# Patient Record
Sex: Female | Born: 1935 | Race: White | Hispanic: No | Smoking: Never smoker
Health system: Southern US, Community
[De-identification: ages and names within clinical notes are randomized; demographics above are authoritative.]

## PROBLEM LIST (undated history)

## (undated) DIAGNOSIS — N390 Urinary tract infection, site not specified: Secondary | ICD-10-CM

## (undated) DIAGNOSIS — G309 Alzheimer's disease, unspecified: Secondary | ICD-10-CM

## (undated) DIAGNOSIS — I10 Essential (primary) hypertension: Secondary | ICD-10-CM

## (undated) DIAGNOSIS — F0280 Dementia in other diseases classified elsewhere without behavioral disturbance: Secondary | ICD-10-CM

## (undated) DIAGNOSIS — M009 Pyogenic arthritis, unspecified: Secondary | ICD-10-CM

## (undated) DIAGNOSIS — I4891 Unspecified atrial fibrillation: Secondary | ICD-10-CM

## (undated) HISTORY — DX: Essential (primary) hypertension: I10

## (undated) HISTORY — DX: Unspecified atrial fibrillation: I48.91

---
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
## 1962-02-08 HISTORY — PX: ABDOMINAL HYSTERECTOMY: SHX81

## 1975-02-09 HISTORY — PX: BLADDER SURGERY: SHX569

## 2001-02-08 HISTORY — PX: CATARACT EXTRACTION: SUR2

## 2002-07-26 ENCOUNTER — Encounter: Admission: RE | Admit: 2002-07-26 | Discharge: 2002-07-26 | Payer: Self-pay | Admitting: Surgery

## 2002-07-26 ENCOUNTER — Encounter: Payer: Self-pay | Admitting: Surgery

## 2004-01-15 ENCOUNTER — Emergency Department (HOSPITAL_COMMUNITY): Admission: EM | Admit: 2004-01-15 | Discharge: 2004-01-15 | Payer: Self-pay | Admitting: Emergency Medicine

## 2004-01-16 ENCOUNTER — Encounter: Admission: RE | Admit: 2004-01-16 | Discharge: 2004-01-16 | Payer: Self-pay | Admitting: Gynecology

## 2004-02-13 ENCOUNTER — Emergency Department (HOSPITAL_COMMUNITY): Admission: EM | Admit: 2004-02-13 | Discharge: 2004-02-13 | Payer: Self-pay | Admitting: Emergency Medicine

## 2005-07-01 ENCOUNTER — Other Ambulatory Visit: Admission: RE | Admit: 2005-07-01 | Discharge: 2005-07-01 | Payer: Self-pay | Admitting: Gynecology

## 2005-07-15 ENCOUNTER — Encounter: Admission: RE | Admit: 2005-07-15 | Discharge: 2005-07-15 | Payer: Self-pay | Admitting: Gynecology

## 2005-10-18 ENCOUNTER — Ambulatory Visit (HOSPITAL_COMMUNITY): Admission: RE | Admit: 2005-10-18 | Discharge: 2005-10-18 | Payer: Self-pay | Admitting: Gynecology

## 2006-07-20 ENCOUNTER — Ambulatory Visit: Payer: Self-pay | Admitting: Cardiology

## 2006-08-11 ENCOUNTER — Encounter: Payer: Self-pay | Admitting: Cardiology

## 2006-08-11 ENCOUNTER — Ambulatory Visit: Payer: Self-pay

## 2006-09-07 ENCOUNTER — Ambulatory Visit: Payer: Self-pay | Admitting: Cardiology

## 2006-10-13 ENCOUNTER — Ambulatory Visit: Payer: Self-pay | Admitting: Cardiology

## 2007-11-09 ENCOUNTER — Ambulatory Visit: Payer: Self-pay | Admitting: Cardiology

## 2007-11-09 LAB — CONVERTED CEMR LAB
BUN: 22 mg/dL (ref 6–23)
CO2: 28 meq/L (ref 19–32)
Calcium: 9.1 mg/dL (ref 8.4–10.5)
Chloride: 99 meq/L (ref 96–112)
Creatinine, Ser: 1.2 mg/dL (ref 0.4–1.2)
GFR calc Af Amer: 55 mL/min
GFR calc non Af Amer: 45 mL/min
Glucose, Bld: 104 mg/dL — ABNORMAL HIGH (ref 70–99)
Potassium: 4.3 meq/L (ref 3.5–5.1)
Sodium: 135 meq/L (ref 135–145)
TSH: 2.83 microintl units/mL (ref 0.35–5.50)

## 2008-01-31 ENCOUNTER — Encounter: Admission: RE | Admit: 2008-01-31 | Discharge: 2008-01-31 | Payer: Self-pay | Admitting: Gynecology

## 2008-05-02 ENCOUNTER — Inpatient Hospital Stay (HOSPITAL_COMMUNITY): Admission: EM | Admit: 2008-05-02 | Discharge: 2008-05-04 | Payer: Self-pay | Admitting: Emergency Medicine

## 2008-07-19 ENCOUNTER — Telehealth: Payer: Self-pay | Admitting: Cardiology

## 2008-12-04 DIAGNOSIS — I10 Essential (primary) hypertension: Secondary | ICD-10-CM | POA: Insufficient documentation

## 2008-12-04 DIAGNOSIS — I4891 Unspecified atrial fibrillation: Secondary | ICD-10-CM

## 2008-12-10 ENCOUNTER — Ambulatory Visit: Payer: Self-pay | Admitting: Cardiology

## 2009-03-12 ENCOUNTER — Emergency Department (HOSPITAL_COMMUNITY): Admission: EM | Admit: 2009-03-12 | Discharge: 2009-03-12 | Payer: Self-pay | Admitting: Emergency Medicine

## 2009-05-04 ENCOUNTER — Emergency Department (HOSPITAL_COMMUNITY): Admission: EM | Admit: 2009-05-04 | Discharge: 2009-05-04 | Payer: Self-pay | Admitting: Emergency Medicine

## 2009-05-25 ENCOUNTER — Emergency Department (HOSPITAL_BASED_OUTPATIENT_CLINIC_OR_DEPARTMENT_OTHER): Admission: EM | Admit: 2009-05-25 | Discharge: 2009-05-25 | Payer: Self-pay | Admitting: Emergency Medicine

## 2009-12-11 ENCOUNTER — Ambulatory Visit: Payer: Self-pay | Admitting: Cardiology

## 2009-12-11 ENCOUNTER — Encounter: Payer: Self-pay | Admitting: Cardiology

## 2009-12-11 DIAGNOSIS — F0280 Dementia in other diseases classified elsewhere without behavioral disturbance: Secondary | ICD-10-CM | POA: Insufficient documentation

## 2009-12-11 DIAGNOSIS — G309 Alzheimer's disease, unspecified: Secondary | ICD-10-CM

## 2010-03-01 ENCOUNTER — Encounter: Payer: Self-pay | Admitting: Gynecology

## 2010-03-10 NOTE — Assessment & Plan Note (Signed)
Summary: yearly f/u /cy   Visit Type:  Follow-up Primary Provider:  Inge Rise at [facility]  CC:  sob.  History of Present Illness: Kaitlyn Lucero comes in today for further management hypertension, palpitations, and atrial fibrillation.  She has developed significant memory problems per her daughter. Other than some dyspnea on exertion she has no complaints.  Current Medications (verified): 1)  Lotensin 20 Mg Tabs (Benazepril Hcl) .... Take 1 Tablet By Mouth Once A Day 2)  Metoprolol Succinate 50 Mg Xr24h-Tab (Metoprolol Succinate) .... Take 1 Tablet By Mouth Once A Day 3)  Synthroid 75 Mcg Tabs (Levothyroxine Sodium) .Marland Kitchen.. 1 Tab Once Daily 4)  Aspirin 81 Mg Tbec (Aspirin) .... Take One Tablet By Mouth Daily 5)  Exelon 9.5 Mg/24hr Pt24 (Rivastigmine) .... Change Every Night  Allergies: 1)  ! Codeine 2)  ! Macrobid 3)  ! Lidocaine 4)  ! Augmentin  Past History:  Past Medical History: Last updated: December 07, 2008 PAROXYSMAL ATRIAL FIBRILLATION (ICD-427.31) HYPERTENSION (ICD-401.9)  Past Surgical History: Last updated: 07-Dec-2008 Bladder suspension 1953 C-section 1961 Hysterectomy 1964 Bladder surgery 1977 Catract surgery Left eye 2003  Family History: Last updated: 12-07-2008 Mother: deceased at 57..lungs and heart problems Father: deceased at 13..pacemaker heart problems Siblings: 1 brother deceased at 81                1 brother has HTN                1 brother has had CABG x 3                 1 sister has HTN  Social History: Last updated: 12-07-08 Retired  Widowed  Tobacco Use - No.  Alcohol Use - yes Drug Use - no  Risk Factors: Smoking Status: never (12/07/08)  Review of Systems       negative other than history of present illness  Vital Signs:  Patient profile:   77 year old female Height:      62 inches Weight:      149 pounds BMI:     27.35 Pulse rate:   68 / minute Pulse rhythm:   regular BP sitting:   132 / 70  (right  arm)  Vitals Entered By: Jacquelin Hawking, CMA (December 11, 2009 3:01 PM)  Physical Exam  General:  Well developed, well nourished, in no acute distress. Head:  normocephalic and atraumatic Eyes:  PERRLA/EOM intact; conjunctiva and lids normal. Neck:  Neck supple, no JVD. No masses, thyromegaly or abnormal cervical nodes. Lungs:  Clear bilaterally to auscultation and percussion. Heart:  PMI nondisplaced, regular rate and rhythm, normal S1-S2 Msk:  decreased ROM.   Pulses:  pulses normal in all 4 extremities Extremities:  No clubbing or cyanosis. Neurologic:  Alert and oriented x 3. Skin:  Intact without lesions or rashes. Psych:  Normal affect.   Impression & Recommendations:  Problem # 1:  PAROXYSMAL ATRIAL FIBRILLATION (ICD-427.31)  Her updated medication list for this problem includes:    Metoprolol Succinate 50 Mg Xr24h-tab (Metoprolol succinate) .Marland Kitchen... Take 1 tablet by mouth once a day    Aspirin 81 Mg Tbec (Aspirin) .Marland Kitchen... Take one tablet by mouth daily  Orders: EKG w/ Interpretation (93000)  Problem # 2:  HYPERTENSION (ICD-401.9)  Her updated medication list for this problem includes:    Lotensin 20 Mg Tabs (Benazepril hcl) .Marland Kitchen... Take 1 tablet by mouth once a day    Metoprolol Succinate 50 Mg Xr24h-tab (Metoprolol succinate) .Marland KitchenMarland KitchenMarland KitchenMarland Kitchen  Take 1 tablet by mouth once a day    Aspirin 81 Mg Tbec (Aspirin) .Marland Kitchen... Take one tablet by mouth daily  Patient Instructions: 1)  Your physician recommends that you schedule a follow-up appointment in: 1 year with Dr. Daleen Squibb 2)  Your physician recommends that you continue on your current medications as directed. Please refer to the Current Medication list given to you today. Prescriptions: METOPROLOL SUCCINATE 50 MG XR24H-TAB (METOPROLOL SUCCINATE) Take 1 tablet by mouth once a day  #30 Tablet x 6   Entered by:   Lisabeth Devoid RN   Authorized by:   Gaylord Shih, MD, [facility]   Signed by:   Lisabeth Devoid RN on 12/11/2009   Method used:   Electronically  to        [id number]* (retail)       [address]       [place], [place]  [address]       Ph: [phone]       Fax: [phone]   RxID:   [id number] LOTENSIN 20 MG TABS (BENAZEPRIL HCL) Take 1 tablet by mouth once a day  #30 x 11   Entered by:   Lisabeth Devoid RN   Authorized by:   Gaylord Shih, MD, [facility]   Signed by:   Lisabeth Devoid RN on 12/11/2009   Method used:   Electronically to        [facility] [id number] (retail)       [address]       [place], [place]  [address]       Ph: [phone]       Fax: [phone]   RxID:   [id number]

## 2010-04-30 LAB — URINALYSIS, ROUTINE W REFLEX MICROSCOPIC
Bilirubin Urine: NEGATIVE
Glucose, UA: NEGATIVE mg/dL
Hgb urine dipstick: NEGATIVE
Ketones, ur: NEGATIVE mg/dL
Nitrite: NEGATIVE
Protein, ur: NEGATIVE mg/dL
Specific Gravity, Urine: 1.009 (ref 1.005–1.030)
Urobilinogen, UA: 0.2 mg/dL (ref 0.0–1.0)
pH: 8 (ref 5.0–8.0)

## 2010-04-30 LAB — DIFFERENTIAL
Basophils Absolute: 0 10*3/uL (ref 0.0–0.1)
Basophils Relative: 1 % (ref 0–1)
Eosinophils Absolute: 0.2 10*3/uL (ref 0.0–0.7)
Eosinophils Relative: 3 % (ref 0–5)
Lymphocytes Relative: 36 % (ref 12–46)
Lymphs Abs: 2.3 10*3/uL (ref 0.7–4.0)
Monocytes Absolute: 0.5 10*3/uL (ref 0.1–1.0)
Monocytes Relative: 8 % (ref 3–12)
Neutro Abs: 3.4 10*3/uL (ref 1.7–7.7)
Neutrophils Relative %: 52 % (ref 43–77)

## 2010-04-30 LAB — CBC
HCT: 36 % (ref 36.0–46.0)
Hemoglobin: 12 g/dL (ref 12.0–15.0)
MCHC: 33.5 g/dL (ref 30.0–36.0)
MCV: 92.6 fL (ref 78.0–100.0)
Platelets: 219 10*3/uL (ref 150–400)
RBC: 3.88 MIL/uL (ref 3.87–5.11)
RDW: 13.7 % (ref 11.5–15.5)
WBC: 6.5 10*3/uL (ref 4.0–10.5)

## 2010-04-30 LAB — POCT I-STAT, CHEM 8
BUN: 14 mg/dL (ref 6–23)
Calcium, Ion: 1.12 mmol/L (ref 1.12–1.32)
Chloride: 108 mEq/L (ref 96–112)
Creatinine, Ser: 0.6 mg/dL (ref 0.4–1.2)
Glucose, Bld: 93 mg/dL (ref 70–99)
HCT: 36 % (ref 36.0–46.0)
Hemoglobin: 12.2 g/dL (ref 12.0–15.0)
Potassium: 3.8 mEq/L (ref 3.5–5.1)
Sodium: 138 mEq/L (ref 135–145)
TCO2: 26 mmol/L (ref 0–100)

## 2010-05-04 LAB — CBC
HCT: 36.3 % (ref 36.0–46.0)
Hemoglobin: 12.1 g/dL (ref 12.0–15.0)
MCHC: 33.4 g/dL (ref 30.0–36.0)
MCV: 92 fL (ref 78.0–100.0)
Platelets: 207 10*3/uL (ref 150–400)
RBC: 3.94 MIL/uL (ref 3.87–5.11)
RDW: 12.2 % (ref 11.5–15.5)
WBC: 11.4 10*3/uL — ABNORMAL HIGH (ref 4.0–10.5)

## 2010-05-04 LAB — COMPREHENSIVE METABOLIC PANEL
ALT: 14 U/L (ref 0–35)
AST: 19 U/L (ref 0–37)
Albumin: 3.5 g/dL (ref 3.5–5.2)
Alkaline Phosphatase: 91 U/L (ref 39–117)
BUN: 15 mg/dL (ref 6–23)
CO2: 23 mEq/L (ref 19–32)
Calcium: 8.8 mg/dL (ref 8.4–10.5)
Chloride: 106 mEq/L (ref 96–112)
Creatinine, Ser: 0.79 mg/dL (ref 0.4–1.2)
GFR calc Af Amer: >60 mL/min (ref >60)
GFR calc non Af Amer: >60 mL/min (ref >60)
Glucose, Bld: 118 mg/dL — ABNORMAL HIGH (ref 70–99)
Potassium: 3.4 mEq/L — ABNORMAL LOW (ref 3.5–5.1)
Sodium: 137 mEq/L (ref 135–145)
Total Bilirubin: 1.1 mg/dL (ref 0.3–1.2)
Total Protein: 7 g/dL (ref 6.0–8.3)

## 2010-05-04 LAB — URINALYSIS, ROUTINE W REFLEX MICROSCOPIC
Bilirubin Urine: NEGATIVE
Glucose, UA: NEGATIVE mg/dL
Hgb urine dipstick: NEGATIVE
Ketones, ur: NEGATIVE mg/dL
Nitrite: NEGATIVE
Protein, ur: NEGATIVE mg/dL
Specific Gravity, Urine: 1.015 (ref 1.005–1.030)
Urobilinogen, UA: 1 mg/dL (ref 0.0–1.0)
pH: 7 (ref 5.0–8.0)

## 2010-05-04 LAB — URINE CULTURE
Colony Count: NO GROWTH
Culture: NO GROWTH

## 2010-05-04 LAB — DIFFERENTIAL
Basophils Absolute: 0.1 10*3/uL (ref 0.0–0.1)
Basophils Relative: 1 % (ref 0–1)
Eosinophils Absolute: 0 10*3/uL (ref 0.0–0.7)
Eosinophils Relative: 0 % (ref 0–5)
Lymphocytes Relative: 12 % (ref 12–46)
Lymphs Abs: 1.3 10*3/uL (ref 0.7–4.0)
Monocytes Absolute: 0.9 10*3/uL (ref 0.1–1.0)
Monocytes Relative: 8 % (ref 3–12)
Neutro Abs: 9.1 10*3/uL — ABNORMAL HIGH (ref 1.7–7.7)
Neutrophils Relative %: 80 % — ABNORMAL HIGH (ref 43–77)

## 2010-05-21 LAB — URINE CULTURE: Colony Count: >=100000

## 2010-05-21 LAB — DIFFERENTIAL
Basophils Relative: 1 % (ref 0–1)
Eosinophils Absolute: 0.1 10*3/uL (ref 0.0–0.7)
Monocytes Absolute: 0.4 10*3/uL (ref 0.1–1.0)
Monocytes Relative: 6 % (ref 3–12)
Neutro Abs: 4.7 10*3/uL (ref 1.7–7.7)

## 2010-05-21 LAB — URINALYSIS, ROUTINE W REFLEX MICROSCOPIC
Bilirubin Urine: NEGATIVE
Glucose, UA: NEGATIVE mg/dL
Ketones, ur: NEGATIVE mg/dL
Nitrite: POSITIVE — AB
Protein, ur: NEGATIVE mg/dL
Specific Gravity, Urine: 1.012 (ref 1.005–1.030)
Urobilinogen, UA: 1 mg/dL (ref 0.0–1.0)
pH: 7.5 (ref 5.0–8.0)

## 2010-05-21 LAB — COMPREHENSIVE METABOLIC PANEL
ALT: 13 U/L (ref 0–35)
Albumin: 3.8 g/dL (ref 3.5–5.2)
Alkaline Phosphatase: 73 U/L (ref 39–117)
GFR calc Af Amer: >60 mL/min (ref >60)
Potassium: 4 mEq/L (ref 3.5–5.1)
Sodium: 133 mEq/L — ABNORMAL LOW (ref 135–145)
Total Protein: 6.6 g/dL (ref 6.0–8.3)

## 2010-05-21 LAB — POCT CARDIAC MARKERS
CKMB, poc: <1 ng/mL — ABNORMAL LOW (ref 1.0–8.0)
Troponin i, poc: <0.05 ng/mL (ref 0.00–0.09)
Troponin i, poc: <0.05 ng/mL (ref 0.00–0.09)

## 2010-05-21 LAB — URINE MICROSCOPIC-ADD ON

## 2010-05-21 LAB — CBC
Platelets: 209 10*3/uL (ref 150–400)
RDW: 12.9 % (ref 11.5–15.5)

## 2010-05-21 LAB — PROTIME-INR: INR: 1 (ref 0.00–1.49)

## 2010-05-21 LAB — POCT I-STAT, CHEM 8
BUN: 17 mg/dL (ref 6–23)
Calcium, Ion: 1.11 mmol/L — ABNORMAL LOW (ref 1.12–1.32)
Chloride: 105 mEq/L (ref 96–112)

## 2010-06-23 NOTE — Procedures (Signed)
CLINICAL HISTORY:  This is an 77 year old female who was admitted with  syncope with tremor of left upper extremity, posturing of right upper  extremity.  Family states that the patient has been confused for the  past few days.   CURRENT MEDICATIONS:  Lotensin, Toprol, aspirin, Synthroid, Rocephin,  and Tylenol.   An 18-channel EEG was performed based on standard international 10-20  system.  Total recording time is 21.6 minutes.  The 17th channel  dedicated EKG which has demonstrated normal sinus rhythm of 66 beats per  minute.   Upon awakening, the posterior background activity was alpha range,  intermixed with some theta-range activity, low amplitude, reactive to  eye opening and closure.  There was also intermittent theta activity  involving the bilateral frontal regions, but there was no evidence of  epileptiform discharge recorded.  The patient was not able to achieve  sleep during recording, photic stimulation with flash frequency 1-19 Hz  was performed, there was no abnormality elicited.   In conclusion, this is a mild abnormal study, there is mild dysrhythmic  background activity, with some intermixed theta-range activity  indicating mild cerebral dysfunction, but there is no evidence of  epileptiform discharge.      Levert Feinstein, MD  Electronically Signed     HQ:IONG  D:  05/03/2008 22:19:45  T:  05/04/2008 29:52:84  Job #:  [id number]

## 2010-06-23 NOTE — Assessment & Plan Note (Signed)
[facility]                            CARDIOLOGY OFFICE NOTE   NAME:Kaitlyn Lucero, Kaitlyn Lucero                       MRN:          [id number]  DATE:10/13/2006                            DOB:          [birth year]    Kaitlyn Lucero returns today for further management of paroxysmal atrial  fibrillation and hypertension.   Her blood pressure is still running high at home.  Most of the readings  are in the 140-150 systolic range.  Her diastolics are always adequate.  Heart rate has been in the 60s and 70s.  She has had no symptomatic  atrial fib.   Her meds are unchanged.   PHYSICAL EXAMINATION:  VITAL SIGNS:  Her blood pressure today is 130/69.  Her pulse is 68 and regular.  She is in a sinus rhythm as confirmed by  EKG.  Her weight is 135.  HEENT:  Unchanged.  NECK:  Shows no JVD.  Carotids are full without bruits.  LUNGS:  Clear.  HEART:  Reveals a regular rate and rhythm.  ABDOMEN:  Soft, good bowel sounds.  EXTREMITIES:  Reveal no edema.  Pulses are intact.  She has a lot of  varicose veins.  SKIN:  Unremarkable otherwise.   ASSESSMENT/PLAN:  Kaitlyn Lucero' blood pressure is still inadequately  controlled.  We will add HCTZ 12.5 mg by mouth every morning.  We have  talked to her about potassium rich foods as well as her daughter,  [name], who is an Charity fundraiser.  We will check a Chem-7 in a couple of weeks.     Thomas C. Daleen Squibb, MD, [facility]  Electronically Signed    TCW/MedQ  DD: 10/13/2006  DT: 10/13/2006  Job #: [id number]

## 2010-06-23 NOTE — Assessment & Plan Note (Signed)
[facility]                            CARDIOLOGY OFFICE NOTE   NAME:JONESBeyounce, Lucero                       MRN:          [id number]  DATE:11/09/2007                            DOB:          08-05-1921    Ms. Kaitlyn Lucero returns today for followup of her hypertension and history of  possible atrial fibrillation.  We have never documented the latter.   On her last visit we added HCTZ 12.5 mg a day.  She use trimethoprim 100  mg a day to day.  She is also on metoprolol 50 mg a day, Lotensin 20 mg  a day, and Synthroid 50 mcg a day.  She is also taking enteric-coated  aspirin 81 mg a day.   PHYSICAL EXAMINATION:  VITAL SIGNS:  Her blood pressure today is 146/68.  I rechecked it in the left arm after she had rested for a few minutes  and it was much better at 132/62.  Her pulse is 60 and regular.  Her EKG  confirms sinus rhythm with T-wave inversion in AVL, which is stable.  HEENT:  Normal.  NECK:  Carotid upstrokes are equal bilaterally without bruits, no JVD.  Thyroid is not enlarged.  Trachea is midline.  LUNGS:  Clear.  HEART:  A nondisplaced PMI.  Normal S1 and S2, no S4.  ABDOMINAL:  Soft, good bowel sounds.  No pulsatile mass.  EXTREMITIES:  No edema.  Pulses are intact.  She has varicose veins.  No  sign of DVT.  NEURO:  Intact.  SKIN:  Unremarkable.   ASSESSMENT/PLAN:  Kaitlyn Lucero' blood pressure is under good control.  We  will renew her medications today and make sure she is taking a  combination of trimethoprim HCTZ.  I will plan on seeing her back again  in a year.  We will check a Chem-7 today and a TSH.   ADDENDUM:  We now have confirmed that she is not on triamterene, she is  on trimethoprim, an antibiotic for chronic bladder problems.  She is not  taking HCTZ.  Therefore, we will not renew this.  She will stay on  Lotensin and metoprolol as outlined in my previous note.     Thomas C. Daleen Squibb, MD, [facility]  Electronically Signed    TCW/MedQ   DD: 11/09/2007  DT: 11/09/2007  Job #: [id number]

## 2010-06-23 NOTE — Assessment & Plan Note (Signed)
[facility]                            CARDIOLOGY OFFICE NOTE   NAME:JONESQuenesha, Douglass                       MRN:          [id number]  DATE:07/20/2006                            DOB:          Sep 15, 1921    Ms. Kaitlyn Lucero is referred today for follow up of a episode of  atrial fibrillation at the end of May while hospitalized with syncope in  [place], [place].   I have evaluated Mrs. Alvizo in the past for some non-cardiac chest pain.  This was about 2005. She also has hyperlipidemia which we chose not to  treat.   While she was out in [place], she developed abdominal pain and was  trying to get out of the car to go the bathroom. She apparently had a  syncopal event.   She was admitted to [facility]. [facility] in [place], [place].   Records are brought today by her daughter.   Her laboratory data was essentially unremarkable including negative  troponin. Her CBC was unremarkable except for a slightly elevated  hemoglobin of 11.2, thyroid parameters were normal, magnesium was  normal.   She apparently had a brief episode of atrial fibrillation that was  caught on telemetry the day prior to discharge. She converted back to  sinus rhythm fairly quickly and was discharged home.   Her risk factors for atrial fibrillation include age and hypertension.   PAST MEDICAL HISTORY:  She is intolerant of CODEINE and AUGMENTIN.   CURRENT MEDICATIONS:  1. Lotensin 20 mg daily.  2. Aspirin 325 mg daily.  3. Florastor 250 mg b.i.d.  4. Metoprolol XL 50 mg daily.  5. Multivitamin daily.  6. Synthroid 50 mcg daily.   She has no dye allergy.   She does not smoke and does not non-prescription drugs. She does have a  couple of drinks of alcohol per year. She has a history of  hyperlipidemia, previously outlined in the chart.   SURGICAL HISTORY:  Two caesarean sections, 2 bladder suspensions,  hysterectomy, and a bilateral salpingo-oophorectomy.   FAMILY HISTORY:  Non-contributory and unremarkable.   SOCIAL HISTORY:  She has been retired since 1988. She lives in  [place]. She is a widow and has two children. Her daughter, who is a  [employer] ICU nurse, is with her today.   REVIEW OF SYSTEMS:  Remarkable for constipation, fatigue, history of  colitis, diverticulosis, chronic bladder infections, bilateral arthritis  of both hands, history of hypothyroidism on replacement therapy, and  history of anxiety. Otherwise, the review of systems are reviewed and  are negative.   PHYSICAL EXAMINATION:  GENERAL:  She looks younger than stated age.  VITAL SIGNS:  Blood pressure 150/72, but her daughter says that it is  usually normal. Her heart rate is 68. She is in sinus rhythm as  confirmed by EKG which is essentially normal. She is 5 foot 3/12 inches  and weighs 138 pounds.  HEENT:  Normocephalic atraumatic, PERRLA, extraocular movements intact,  sclerae clear, facial symmetry is normal.  NECK:  Supple. Carotid upstrokes are equal bilateral without bruits,  there is  no JVD, thyroid is not enlarged, trachea is midline.  LUNGS:  Clear.  HEART:  Reveals a non-displaced PMI, normal S1, S2. No obvious murmur.  ABDOMEN:  Soft with good bowel sounds, no midline bruit, no  hepatomegaly.  EXTREMITIES:  No cyanosis, clubbing, or edema. Pulses are intact. She  has some varicose veins. There is no sign of DVT.  NEUROLOGIC:  Intact.  SKIN:  Unremarkable otherwise.   ASSESSMENT:  1. Paroxysmal atrial fibrillation, caught on telemetry during      hospitalization as noted above. We do not have documentation of      this. She does have risk factors of hypertension and increasing      age. I am delighted that she is in sinus rhythm today and has a      normal EKG.  2. Hyperlipidemia.  3. Hypertension.  4. Hypothyroidism on replacement therapy with recent normal thyroid      parameters.   PLAN:  I have had a long talk with her and her  daughter. At this point  in time, I would like to obtain a 2D echocardiogram to rule out any  structural heart disease. If this is normal, I think that at the present  time, we could go with either aspirin 325 mg daily or Coumadin. Her [place]  2 score is in the low risk range as long as her blood pressure is under  control. We would like to stay away from Coumadin with her history of  colitis and a history of some GI bleeding.   I will send these records to Dr. Kinnie Scales to get his input.   I will see her back after the 2D echocardiogram. I renewed her Lotensin  and Metoprolol and gave her a follow up appointment.     Thomas C. Daleen Squibb, MD, [facility]  Electronically Signed    TCW/MedQ  DD: 07/20/2006  DT: 07/21/2006  Job #: [id number]   cc:   Gretta Cool, M.D.  Griffith Citron, M.D.

## 2010-06-23 NOTE — Assessment & Plan Note (Signed)
[facility]                            CARDIOLOGY OFFICE NOTE   NAME:JONESVernia, Teem                       MRN:          [id number]  DATE:11/09/2007                            DOB:          1921/07/23    ADDENDUM:  We now have confirmed that she is not on triamterene, she is on  trimethoprim, an antibiotic for chronic bladder problems.  She is not  taking HCTZ.  Therefore, we will not renew this.  She will stay on  Lotensin and metoprolol as outlined in my previous note.     Thomas C. Daleen Squibb, MD, [facility]     TCW/MedQ  DD: 11/09/2007  DT: 11/10/2007  Job #: [id number]

## 2010-06-23 NOTE — Assessment & Plan Note (Signed)
[facility]                            CARDIOLOGY OFFICE NOTE   NAME:JONESKensi, Kaitlyn Lucero                       MRN:          [id number]  DATE:09/07/2006                            DOB:          05/04/1921    [name] returns today for further management of paroxysmal atrial  fibrillation and hypertension.  Please see my extensive note on July 20, 2006.   Her atrial fibrillation has not been caught on telemetry, or at least I  have no documentation of such.  She does have hypertension and her age  is risk factor.   I performed a 2-D echocardiogram which showed an EF of 55-65%, mild left  ventricular hypertrophy, mild aortic regurgitation, mild mitral  regurgitation, mild left atrial dilatation, and mild to moderate  tricuspid regurgitation.   She says she has not felt well in general so she cut her Lotensin back  from 20 mg a day to 10 mg a day.  She is still taking metoprolol 50 mg a  day.   Her blood pressure today was 187/80.  I rechecked, it was 172/80.  Her  pulse is 64 and regular, weight is 136.  HEENT unchanged.  Carotids are  full, there are no bruits.  Thyroid is not enlarged, trachea midline.  Lungs are clear.  Heart reveals a dynamic S1, S2.  Soft systolic murmur  at the apex.  Abdominal exam is soft, good bowel sounds.  Extremities  with no edema, pulses are intact, neurologic exam is intact.   I have had a long talk with [name] and [name].  With her being  hypertensive and having this poorly controlled, this puts her in a  higher CHAD2 score.  We would like not to use Coumadin because, one she  does not want to take it nor does her daughter, but she also has a  history of colitis and some GI bleeding.   PLAN:  1. Take Lotensin 20 mg each day as prescribed.  2. Continue metoprolol 50 mg a day.  3. Check blood pressures.   [name] will call us if her blood pressure is running consistently above  140 systolic.  At that point we  will add hydrochlorothiazide which  apparently she was on as part of her Lotensin regimen prior to her  hospitalization in [place].  Our goal will be about 140/80.   In the meantime, she will continue aspirin 325 a day.   I will schedule her to follow up in 4-6 weeks.     Thomas C. Daleen Squibb, MD, [facility]  Electronically Signed    TCW/MedQ  DD: 09/07/2006  DT: 09/08/2006  Job #: [id number]   cc:   Gretta Cool, M.D.

## 2010-06-26 NOTE — H&P (Signed)
Kaitlyn Lucero, Kaitlyn Lucero              ACCOUNT NO.:  [id number]   MEDICAL RECORD NO.:  [id number]          PATIENT TYPE:  INP   LOCATION:  3010                         FACILITY:  [facility]   PHYSICIAN:  Corinna L. Lendell Caprice, MDDATE OF BIRTH:  [birth year]   DATE OF ADMISSION:  05/02/2008  DATE OF DISCHARGE:  05/04/2008                              HISTORY & PHYSICAL   CHIEF COMPLAINT:  Episode of unresponsiveness.   HISTORY OF PRESENT ILLNESS:  Kaitlyn Lucero is an unassigned 77 year old  white female who was at the hairdresser when she had an episode of  unresponsiveness.  There was possibly some seizure activity described by  the daughter.  When the patient woke up, she seemed more confused than  usual.  Her daughter reported that her memory has worsened and she is  concerned about dementia.  She has a history of chronic urinary tract  infections.   PAST MEDICAL HISTORY:  1. Hypertension.  2. Recurrent urinary tract infections.   MEDICATIONS:  1. Synthroid 50 mcg a day.  2. Benazepril 20 mg a day.  3. Toprol-XL 50 mg a day.  4. Aspirin 81 mg a day.  5. Trimethoprim, dose unknown.   SOCIAL HISTORY:  The patient lives with her daughter, does not smoke or  drink.   Family history is noncontributory.   REVIEW OF SYSTEMS:  Unable to obtain as the patient is confused.   PHYSICAL EXAMINATION:  VITAL SIGNS:  Please see nursing notes for vital  signs.  GENERAL:  The patient is alert.  She is disoriented.  HEENT: Pupils equal, round, reactive to light.  Moist mucous membranes.  NECK:  Supple.  LUNGS:  Clear to auscultation bilaterally without wheezes, rhonchi, or  rales.  CARDIOVASCULAR:  Regular rate and rhythm without murmurs, gallops, or  rubs.  ABDOMEN:  Normal bowel sounds, soft, nontender, nondistended.  GU AND RECTAL:  Deferred.  EXTREMITIES:  No clubbing, cyanosis, or edema.  SKIN:  No rash.  PSYCHIATRIC:  Cooperative.  NEUROLOGIC:  Alert, disoriented.  No focal weakness.   CBC  is unremarkable.  INR unremarkable.  Complete metabolic panel  unremarkable.  Point-of-care enzymes negative.  Urinalysis showed  positive nitrite, large leukocyte esterase, too numerous to count white  cells, many bacteria, trace blood.  CT brain showed nothing acute.  Chest x-ray showed nothing acute.   ASSESSMENT AND PLAN:  1. Urinary tract infection.  The patient will be started on Rocephin      and cultures.  She may very well have resistant bacteria.  2. Episode of unresponsiveness, question seizure, possibly related to      lowering of her seizure threshold due to infection.  I will order      an EEG.  She will be on seizure precautions.  Hold off on any      seizure medications at this time.  3. Hypertension.  Continue outpatient medications.  4. Hypothyroidism.  Check TSH.      Corinna L. Lendell Caprice, MD  Electronically Signed     CLS/MEDQ  D:  05/29/2008  T:  05/30/2008  Job:  [id number]

## 2010-12-11 ENCOUNTER — Ambulatory Visit: Payer: Self-pay | Admitting: Cardiology

## 2010-12-16 ENCOUNTER — Encounter: Payer: Self-pay | Admitting: *Deleted

## 2010-12-18 ENCOUNTER — Ambulatory Visit (INDEPENDENT_AMBULATORY_CARE_PROVIDER_SITE_OTHER): Payer: Medicare Other | Admitting: Cardiology

## 2010-12-18 ENCOUNTER — Encounter: Payer: Self-pay | Admitting: Cardiology

## 2010-12-18 VITALS — BP 132/76 | HR 75 | Ht 62.0 in | Wt 142.0 lb

## 2010-12-18 DIAGNOSIS — I10 Essential (primary) hypertension: Secondary | ICD-10-CM

## 2010-12-18 DIAGNOSIS — I4891 Unspecified atrial fibrillation: Secondary | ICD-10-CM

## 2010-12-18 NOTE — Patient Instructions (Signed)
Your physician wants you to follow-up in:  1 year with Dr. Daleen Squibb. You will receive a reminder letter in the mail two months in advance. If you don't receive a letter, please call our office to schedule the follow-up appointment.  Allegra-D 24hour 1 every am until she is over her congestion.

## 2010-12-18 NOTE — Progress Notes (Signed)
HPI Kaitlyn Lucero returns today for evaluation and management of her history of paroxysmal A. Fib and hypertension.  She is pleasantly demented. She denies any symptoms of chest discomfort or ischemic heart disease. She has not had any clinical symptoms of A. Fib. Her blood pressure good control. She now resides at [facility] in assisted living.  Meds reviewed and she is compliant. There's no orthopnea, PND or edema. There's been no syncope or falls.  Her biggest complaint is that of a cold and congestion. She denies any fever chills or sweats. Her daughter wants me to prescribe something that is safe for her heart.     Past Medical History  Diagnosis Date  . Atrial fibrillation   . Unspecified essential hypertension     Past Surgical History  Procedure Date  . Bladder suspension 1953  . Cesarean section 1961  . Abdominal hysterectomy 1964  . Bladder surgery 1977  . Cataract extraction 2003    left eye    Family History  Problem Relation Age of Onset  . Other Mother     lung and heart problems  . Other Father     pace maker, heart problems  . Hypertension Brother   . Other Brother     CABG x3  . Hypertension Sister     History   Social History  . Marital Status: Widowed    Spouse Name: N/A    Number of Children: N/A  . Years of Education: N/A   Occupational History  . retired    Social History Main Topics  . Smoking status: Never Smoker   . Smokeless tobacco: Not on file  . Alcohol Use: No  . Drug Use: No  . Sexually Active: Not on file   Other Topics Concern  . Not on file   Social History Narrative  . No narrative on file    Allergies  Allergen Reactions  . Codeine   . ZOX:WRUEAVWUJWJ+XBJYNWGNF+AOZHYQMVHQ Acid+Aspartame   . Lidocaine   . Nitrofurantoin     Current Outpatient Prescriptions  Medication Sig Dispense Refill  . acetaminophen (TYLENOL) 325 MG tablet Take 650 mg by mouth every 6 (six) hours as needed.        Marland Kitchen aspirin 81 MG  tablet Take 81 mg by mouth daily.        . benazepril (LOTENSIN) 20 MG tablet Take 20 mg by mouth daily.        Marland Kitchen levothyroxine (SYNTHROID, LEVOTHROID) 75 MCG tablet Take 75 mcg by mouth daily.        . metoprolol (TOPROL-XL) 50 MG 24 hr tablet Take 50 mg by mouth daily.        . rivastigmine (EXELON) 9.5 mg/24hr Place 1 patch onto the skin at bedtime.          ROS Negative other than HPI.   PE General Appearance: well developed, well nourished in no acute distress, elderly, frail HEENT: symmetrical face, PERRLA, good dentition  Neck: no JVD, thyromegaly, or adenopathy, trachea midline Chest: symmetric without deformity Cardiac: PMI non-displaced, RRR, normal S1, S2, no gallop or murmur Lung: clear to ausculation and percussion Vascular: all pulses full without bruits  Abdominal: nondistended, nontender, good bowel sounds, no HSM, no bruits Extremities: no cyanosis, clubbing or edema, no sign of DVT, no varicosities  Skin: normal color, no rashes Neuro: alert and oriented x 3, non-focal Pysch: normal affect Filed Vitals:   12/18/10 1433  BP: 132/76  Pulse: 75  Height: 5\' 2"  (1.575  m)  Weight: 142 lb (64.411 kg)    EKG Normal sinus rhythm, normal EKG Labs and Studies Reviewed.   Lab Results  Component Value Date   WBC 11.4* 05/04/2009   HGB 12.1 05/04/2009   HCT 36.3 05/04/2009   MCV 92.0 05/04/2009   PLT 207 05/04/2009      Chemistry      Component Value Date/Time   NA 137 05/04/2009 0835   K 3.4* 05/04/2009 0835   CL 106 05/04/2009 0835   CO2 23 05/04/2009 0835   BUN 15 05/04/2009 0835   CREATININE 0.79 05/04/2009 0835      Component Value Date/Time   CALCIUM 8.8 05/04/2009 0835   ALKPHOS 91 05/04/2009 0835   AST 19 05/04/2009 0835   ALT 14 05/04/2009 0835   BILITOT 1.1 05/04/2009 0835       No results found for this basename: CHOL   No results found for this basename: HDL   No results found for this basename: LDLCALC   No results found for this basename: TRIG     No results found for this basename: CHOLHDL   No results found for this basename: HGBA1C   Lab Results  Component Value Date   ALT 14 05/04/2009   AST 19 05/04/2009   ALKPHOS 91 05/04/2009   BILITOT 1.1 05/04/2009

## 2010-12-18 NOTE — Assessment & Plan Note (Signed)
No clinical recurrence. Conservative therapy. Prescribed  the lowest dose of Allegra D. for her cold.  Followup in one year or p.r.n.

## 2010-12-18 NOTE — Assessment & Plan Note (Signed)
Under good control. No changes in therapy.

## 2010-12-29 NOTE — Progress Notes (Signed)
Addended by: Micki Riley C on: 12/29/2010 02:24 PM   Modules accepted: Orders

## 2010-12-30 ENCOUNTER — Other Ambulatory Visit: Payer: Self-pay

## 2010-12-30 ENCOUNTER — Emergency Department (HOSPITAL_COMMUNITY)
Admission: EM | Admit: 2010-12-30 | Discharge: 2010-12-31 | Disposition: A | Discharge status: Skilled Nursing Facility | Payer: Medicare Other | Attending: Emergency Medicine | Admitting: Emergency Medicine

## 2010-12-30 ENCOUNTER — Emergency Department (HOSPITAL_COMMUNITY): Payer: Medicare Other

## 2010-12-30 ENCOUNTER — Encounter (HOSPITAL_COMMUNITY): Payer: Self-pay | Admitting: Emergency Medicine

## 2010-12-30 DIAGNOSIS — N390 Urinary tract infection, site not specified: Secondary | ICD-10-CM | POA: Insufficient documentation

## 2010-12-30 DIAGNOSIS — I4891 Unspecified atrial fibrillation: Secondary | ICD-10-CM | POA: Insufficient documentation

## 2010-12-30 DIAGNOSIS — R079 Chest pain, unspecified: Secondary | ICD-10-CM | POA: Insufficient documentation

## 2010-12-30 DIAGNOSIS — Z7982 Long term (current) use of aspirin: Secondary | ICD-10-CM | POA: Insufficient documentation

## 2010-12-30 DIAGNOSIS — F0280 Dementia in other diseases classified elsewhere without behavioral disturbance: Secondary | ICD-10-CM | POA: Insufficient documentation

## 2010-12-30 DIAGNOSIS — I10 Essential (primary) hypertension: Secondary | ICD-10-CM | POA: Insufficient documentation

## 2010-12-30 DIAGNOSIS — G309 Alzheimer's disease, unspecified: Secondary | ICD-10-CM | POA: Insufficient documentation

## 2010-12-30 DIAGNOSIS — R109 Unspecified abdominal pain: Secondary | ICD-10-CM | POA: Insufficient documentation

## 2010-12-30 DIAGNOSIS — Z79899 Other long term (current) drug therapy: Secondary | ICD-10-CM | POA: Insufficient documentation

## 2010-12-30 HISTORY — DX: Alzheimer's disease, unspecified: G30.9

## 2010-12-30 HISTORY — DX: Dementia in other diseases classified elsewhere, unspecified severity, without behavioral disturbance, psychotic disturbance, mood disturbance, and anxiety: F02.80

## 2010-12-30 LAB — URINALYSIS, ROUTINE W REFLEX MICROSCOPIC
Bilirubin Urine: NEGATIVE
Glucose, UA: NEGATIVE mg/dL
Protein, ur: NEGATIVE mg/dL
Urobilinogen, UA: 0.2 mg/dL (ref 0.0–1.0)

## 2010-12-30 LAB — POCT I-STAT, CHEM 8
Glucose, Bld: 113 mg/dL — ABNORMAL HIGH (ref 70–99)
HCT: 44 % (ref 36.0–46.0)
Hemoglobin: 15 g/dL (ref 12.0–15.0)
Potassium: 3.6 mEq/L (ref 3.5–5.1)
Sodium: 138 mEq/L (ref 135–145)
TCO2: 24 mmol/L (ref 0–100)

## 2010-12-30 LAB — DIFFERENTIAL
Basophils Relative: 1 % (ref 0–1)
Eosinophils Absolute: 0.1 10*3/uL (ref 0.0–0.7)
Eosinophils Relative: 1 % (ref 0–5)
Monocytes Absolute: 0.8 10*3/uL (ref 0.1–1.0)
Monocytes Relative: 7 % (ref 3–12)
Neutrophils Relative %: 71 % (ref 43–77)

## 2010-12-30 LAB — CBC
HCT: 38.9 % (ref 36.0–46.0)
Hemoglobin: 13 g/dL (ref 12.0–15.0)
MCH: 29.7 pg (ref 26.0–34.0)
MCHC: 33.4 g/dL (ref 30.0–36.0)
MCV: 89 fL (ref 78.0–100.0)

## 2010-12-30 LAB — URINE MICROSCOPIC-ADD ON

## 2010-12-30 LAB — POCT I-STAT TROPONIN I

## 2010-12-30 MED ORDER — CIPROFLOXACIN IN D5W 400 MG/200ML IV SOLN
400.0000 mg | Freq: Once | INTRAVENOUS | Status: AC
  Administered 2010-12-30: 400 mg via INTRAVENOUS
  Filled 2010-12-30: qty 200

## 2010-12-30 MED ORDER — CIPROFLOXACIN HCL 500 MG PO TABS: 500.0000 mg | ORAL_TABLET | Freq: Two times a day (BID) | ORAL | Status: DC

## 2010-12-30 NOTE — ED Provider Notes (Signed)
History     CSN: [id number] Arrival date & time: 12/30/2010  8:22 PM   First MD Initiated Contact with Patient 12/30/10 2039      Chief Complaint  Patient presents with  . Flank Pain    (Consider location/radiation/quality/duration/timing/severity/associated sxs/prior treatment) The history is provided by the nursing home and a relative.   Pt is coming from [facility], sudden onset of CP/abdominal pain/emesis x1. Complaint to ems was left flank pain with n/v x1. Pt is confused (alzhimers pt, is at baseline)  Past Medical History  Diagnosis Date  . Atrial fibrillation   . Unspecified essential hypertension   . Alzheimer disease     Past Surgical History  Procedure Date  . Bladder suspension 1953  . Cesarean section 1961  . Abdominal hysterectomy 1964  . Bladder surgery 1977  . Cataract extraction 2003    left eye    Family History  Problem Relation Age of Onset  . Other Mother     lung and heart problems  . Other Father     pace maker, heart problems  . Hypertension Brother   . Other Brother     CABG x3  . Hypertension Sister     History  Substance Use Topics  . Smoking status: Never Smoker   . Smokeless tobacco: Not on file  . Alcohol Use: No    OB History    Grav Para Term Preterm Abortions TAB SAB Ect Mult Living                  Review of Systems  Unable to perform ROS: Dementia    Allergies  Codeine; OZH:YQMVHQIONGE+XBMWUXLKG+MWNUUVOZDG acid+aspartame; Lidocaine; and Nitrofurantoin  Home Medications   Current Outpatient Rx  Name Route Sig Dispense Refill  . ASPIRIN 81 MG PO TABS Oral Take 81 mg by mouth daily.      Marland Kitchen BENAZEPRIL HCL 20 MG PO TABS Oral Take 20 mg by mouth daily.      Marland Kitchen LEVOTHYROXINE SODIUM 75 MCG PO TABS Oral Take 75 mcg by mouth daily.     Marland Kitchen METOPROLOL SUCCINATE 50 MG PO TB24 Oral Take 50 mg by mouth daily.      Marland Kitchen RIVASTIGMINE 9.5 MG/24HR TD PT24 Transdermal Place 1 patch onto the skin at bedtime.     .  ACETAMINOPHEN 325 MG PO TABS Oral Take 650 mg by mouth every 6 (six) hours as needed. HEADACHE.      BP 162/89  Pulse 72  Temp(Src) 98.6 F (37 C) (Oral)  Resp 20  SpO2 97%  Physical Exam  Nursing note and vitals reviewed. Constitutional: She is oriented to person, place, and time. She appears well-developed and well-nourished. She is cooperative. No distress.  HENT:  Head: Normocephalic and atraumatic.  Eyes: Pupils are equal, round, and reactive to light.  Neck: Normal range of motion.  Cardiovascular: Normal rate, regular rhythm and intact distal pulses.          Date: 12/30/2010  Rate: 68  Rhythm: normal sinus rhythm  QRS Axis: normal  Intervals: normal  ST/T Wave abnormalities: normal  Conduction Disutrbances:none:   Old EKG Reviewed: unchanged     Pulmonary/Chest: No respiratory distress.  Abdominal: Soft. Normal appearance and bowel sounds are normal. She exhibits no distension. There is no tenderness. There is no rigidity, no guarding, no tenderness at McBurney's point and negative Murphy's sign.  Musculoskeletal: Normal range of motion.  Neurological: She is alert and oriented to person, place, and time.  No cranial nerve deficit.  Skin: Skin is warm and dry. No rash noted.  Psychiatric: She has a normal mood and affect. Her speech is normal and behavior is normal.    ED Course  Procedures (including critical care time)   Cipro given IV prior to discharge   Labs Reviewed  URINALYSIS, ROUTINE W REFLEX MICROSCOPIC - Abnormal; Notable for the following:    Appearance CLOUDY (*)    Hgb urine dipstick TRACE (*)    Nitrite POSITIVE (*)    Leukocytes, UA LARGE (*)    All other components within normal limits  POCT I-STAT, CHEM 8 - Abnormal; Notable for the following:    Glucose, Bld 113 (*)    Calcium, Ion 1.09 (*)    All other components within normal limits  URINE MICROSCOPIC-ADD ON - Abnormal; Notable for the following:    Bacteria, UA MANY (*)    All  other components within normal limits  CBC  DIFFERENTIAL  POCT I-STAT TROPONIN I  I-STAT, CHEM 8  URINE CULTURE  I-STAT TROPONIN I   No results found.   1. UTI (urinary tract infection)       MDM          Nelia Shi, MD 12/30/10 2215

## 2010-12-30 NOTE — ED Notes (Addendum)
Patient brought from Nursing Home - [facility] for new onset flank pain per Ems. Upon assessment and speaking with Daughter, reports that the patient had chest pain tonight and was sent here to [facility] for the chest pain and shaking all over.

## 2010-12-30 NOTE — ED Notes (Signed)
Pt is coming from [facility], sudden onset of CP/abdominal pain/emesis x1.  Complaint to ems was left flank pain with n/v x1. Pt is confused (alzhimers pt, is at baseline) Pressure 140/80, pulse 76

## 2010-12-30 NOTE — ED Notes (Signed)
BJY:[id number]<FA> Expected date:12/30/10<BR> Expected time: 8:15 PM<BR> Means of arrival:Ambulance<BR> Comments:<BR> EMS 30 GC, 89 yof flank pain

## 2010-12-31 NOTE — ED Notes (Signed)
Report called to the Nursing Home, Misty Stanley was updated at the nursing home with interventions done here at the hospital

## 2010-12-31 NOTE — ED Notes (Signed)
Patient transferred back to the Nursing home. Patient with no new complaints. [facility] took patient to the Nursing home. All papers transferred with patient. Daughter signed for patient

## 2011-01-01 LAB — URINE CULTURE: Colony Count: >=100000

## 2011-01-02 NOTE — ED Notes (Signed)
+   Urine culture. Resistant to prescribed Cipro. Chart sent to EDP office for review.

## 2011-01-03 NOTE — ED Notes (Signed)
+   Urine culture. Patient resident @ [facility]. Freida Busman, RN @ [facility] notified by phone and result faxed to [phone]

## 2011-01-06 ENCOUNTER — Encounter (HOSPITAL_COMMUNITY): Payer: Self-pay | Admitting: Emergency Medicine

## 2011-01-06 ENCOUNTER — Emergency Department (HOSPITAL_COMMUNITY)
Admission: EM | Admit: 2011-01-06 | Discharge: 2011-01-07 | Disposition: A | Discharge status: Home / Self Care | Payer: Medicare Other | Attending: Emergency Medicine | Admitting: Emergency Medicine

## 2011-01-06 DIAGNOSIS — R10819 Abdominal tenderness, unspecified site: Secondary | ICD-10-CM | POA: Insufficient documentation

## 2011-01-06 DIAGNOSIS — I10 Essential (primary) hypertension: Secondary | ICD-10-CM | POA: Insufficient documentation

## 2011-01-06 DIAGNOSIS — R112 Nausea with vomiting, unspecified: Secondary | ICD-10-CM | POA: Insufficient documentation

## 2011-01-06 DIAGNOSIS — N390 Urinary tract infection, site not specified: Secondary | ICD-10-CM | POA: Insufficient documentation

## 2011-01-06 DIAGNOSIS — F0280 Dementia in other diseases classified elsewhere without behavioral disturbance: Secondary | ICD-10-CM | POA: Insufficient documentation

## 2011-01-06 DIAGNOSIS — G309 Alzheimer's disease, unspecified: Secondary | ICD-10-CM | POA: Insufficient documentation

## 2011-01-06 NOTE — ED Notes (Signed)
ZOX:[id number]<AV> Expected date:01/06/11<BR> Expected time:10:30 PM<BR> Means of arrival:Ambulance<BR> Comments:<BR> EMS 211 GC - ALOC/nausea

## 2011-01-06 NOTE — ED Notes (Signed)
Per ems, the patient family states that the patient is acting altered compared to normal but acts like this when she gets a uti and that she is recently being treated for a uti.  Patient family states has had n/v today.   #18 left ac, 4mg  zofran IV at 2220.

## 2011-01-06 NOTE — ED Notes (Signed)
In and out cath done / urine specimen

## 2011-01-07 ENCOUNTER — Emergency Department (HOSPITAL_COMMUNITY): Payer: Medicare Other

## 2011-01-07 LAB — URINALYSIS, ROUTINE W REFLEX MICROSCOPIC
Bilirubin Urine: NEGATIVE
Hgb urine dipstick: NEGATIVE
Nitrite: NEGATIVE
Protein, ur: NEGATIVE mg/dL
Specific Gravity, Urine: 1.012 (ref 1.005–1.030)
Urobilinogen, UA: 0.2 mg/dL (ref 0.0–1.0)

## 2011-01-07 LAB — DIFFERENTIAL
Eosinophils Relative: 1 % (ref 0–5)
Lymphocytes Relative: 17 % (ref 12–46)
Lymphs Abs: 1.8 10*3/uL (ref 0.7–4.0)
Monocytes Absolute: 0.8 10*3/uL (ref 0.1–1.0)
Neutro Abs: 8.2 10*3/uL — ABNORMAL HIGH (ref 1.7–7.7)

## 2011-01-07 LAB — URINE MICROSCOPIC-ADD ON

## 2011-01-07 LAB — COMPREHENSIVE METABOLIC PANEL
ALT: 12 U/L (ref 0–35)
AST: 19 U/L (ref 0–37)
CO2: 23 mEq/L (ref 19–32)
Calcium: 9.2 mg/dL (ref 8.4–10.5)
Chloride: 100 mEq/L (ref 96–112)
Creatinine, Ser: 0.78 mg/dL (ref 0.50–1.10)
GFR calc Af Amer: 85 mL/min — ABNORMAL LOW (ref >90)
GFR calc non Af Amer: 73 mL/min — ABNORMAL LOW (ref >90)
Glucose, Bld: 130 mg/dL — ABNORMAL HIGH (ref 70–99)
Total Bilirubin: 0.4 mg/dL (ref 0.3–1.2)

## 2011-01-07 LAB — CBC
HCT: 36.8 % (ref 36.0–46.0)
Hemoglobin: 12.7 g/dL (ref 12.0–15.0)
MCV: 87.2 fL (ref 78.0–100.0)
RBC: 4.22 MIL/uL (ref 3.87–5.11)
WBC: 11 10*3/uL — ABNORMAL HIGH (ref 4.0–10.5)

## 2011-01-07 MED ORDER — SULFAMETHOXAZOLE-TMP DS 800-160 MG PO TABS
1.0000 | ORAL_TABLET | Freq: Once | ORAL | Status: AC
  Administered 2011-01-07: 1 via ORAL
  Filled 2011-01-07: qty 1

## 2011-01-07 MED ORDER — ONDANSETRON HCL 4 MG/2ML IJ SOLN
4.0000 mg | Freq: Once | INTRAMUSCULAR | Status: DC
  Filled 2011-01-07: qty 2

## 2011-01-07 MED ORDER — ONDANSETRON HCL 4 MG PO TABS: 4.0000 mg | ORAL_TABLET | Freq: Four times a day (QID) | ORAL | Status: AC

## 2011-01-07 MED ORDER — ONDANSETRON 8 MG PO TBDP: 8.0000 mg | ORAL_TABLET | Freq: Once | ORAL | Status: DC

## 2011-01-07 MED ORDER — ONDANSETRON 8 MG PO TBDP
8.0000 mg | ORAL_TABLET | Freq: Once | ORAL | Status: AC
  Administered 2011-01-07: 8 mg via ORAL
  Filled 2011-01-07: qty 1

## 2011-01-07 MED ORDER — SULFAMETHOXAZOLE-TRIMETHOPRIM 800-160 MG PO TABS: 1.0000 | ORAL_TABLET | Freq: Two times a day (BID) | ORAL | Status: AC

## 2011-01-07 NOTE — ED Notes (Signed)
Patient family informed of patient discharge instructions.

## 2011-01-07 NOTE — ED Notes (Signed)
Notified ptar that patient is ready for discharge.

## 2011-01-07 NOTE — ED Notes (Signed)
Report given to nursing home.  Patient family at bedside and aware of patient care.  ptar given report.  Patient discharged back to nursing home.

## 2011-01-07 NOTE — ED Notes (Signed)
Notified md that patient 02 was down to 72% on room air. Placed patient on 2L [place] and notified respiratory.  Family at bedside. Patient is now 100% on 2L [place]

## 2011-01-07 NOTE — ED Provider Notes (Signed)
History     CSN: [id number] Arrival date & time: 01/06/2011 10:41 PM   First MD Initiated Contact with Patient 01/06/11 2350      Chief Complaint  Patient presents with  . Nausea  . Emesis  . Urinary Tract Infection    (Consider location/radiation/quality/duration/timing/severity/associated sxs/prior treatment) HPI 77 year old female presents via EMS from local nursing home with complaint of nausea and vomiting x3, and altered mental status. Family report they were called saying that the patient had multiple episodes of vomiting. When patient was transported by EMS she complained of right leg pain, but does not complain of this at this time. Patient with history of dementia, but family feels that she is more confused than normal. No reported fever. Unknown last bowel movement. Patient was seen in the emergency department 7 days ago and treated for urinary tract infection with Cipro at that time. Patient has history of frequent UTIs. Family reports they're were with her today around 3 PM and she was acting at her baseline Past Medical History  Diagnosis Date  . Atrial fibrillation   . Unspecified essential hypertension   . Alzheimer disease     Past Surgical History  Procedure Date  . Bladder suspension 1953  . Cesarean section 1961  . Abdominal hysterectomy 1964  . Bladder surgery 1977  . Cataract extraction 2003    left eye    Family History  Problem Relation Age of Onset  . Other Mother     lung and heart problems  . Other Father     pace maker, heart problems  . Hypertension Brother   . Other Brother     CABG x3  . Hypertension Sister     History  Substance Use Topics  . Smoking status: Never Smoker   . Smokeless tobacco: Not on file  . Alcohol Use: No    OB History    Grav Para Term Preterm Abortions TAB SAB Ect Mult Living                  Review of Systems  Unable to perform ROS: Dementia    Allergies  Codeine;  WGN:FAOZHYQMVHQ+IONGEXBMW+UXLKGMWNUU acid+aspartame; Lidocaine; and Nitrofurantoin  Home Medications   Current Outpatient Rx  Name Route Sig Dispense Refill  . ASPIRIN 81 MG PO TABS Oral Take 81 mg by mouth daily.      Marland Kitchen BENAZEPRIL HCL 20 MG PO TABS Oral Take 20 mg by mouth daily.      Marland Kitchen ENSURE IMMUNE HEALTH PO LIQD Oral Take 237 mLs by mouth 2 (two) times daily. Vanilla flavor     . LEVOTHYROXINE SODIUM 75 MCG PO TABS Oral Take 75 mcg by mouth daily.     Marland Kitchen METOPROLOL SUCCINATE 50 MG PO TB24 Oral Take 50 mg by mouth daily.      Marland Kitchen RIVASTIGMINE 9.5 MG/24HR TD PT24 Transdermal Place 1 patch onto the skin at bedtime.     . ACETAMINOPHEN 325 MG PO TABS Oral Take 650 mg by mouth every 6 (six) hours as needed. HEADACHE.      BP 206/74  Pulse 59  Temp(Src) 97.5 F (36.4 C) (Oral)  Resp 18  SpO2 100%  Physical Exam  Nursing note and vitals reviewed. Constitutional: She appears well-developed and well-nourished.  HENT:  Head: Normocephalic and atraumatic.  Nose: Nose normal.  Mouth/Throat: Oropharynx is clear and moist.  Eyes: Conjunctivae and EOM are normal. Pupils are equal, round, and reactive to light.  Neck: Normal range of motion.  Neck supple. No JVD present. No tracheal deviation present. No thyromegaly present.  Cardiovascular: Normal rate, regular rhythm, normal heart sounds and intact distal pulses.  Exam reveals no gallop and no friction rub.   No murmur heard. Pulmonary/Chest: Effort normal and breath sounds normal. No stridor. No respiratory distress. She has no wheezes. She has no rales. She exhibits no tenderness.  Abdominal: Soft. Bowel sounds are normal. She exhibits no distension and no mass. There is tenderness (tenderness to palpation over suprapubic region). There is no rebound and no guarding.  Musculoskeletal: Normal range of motion. She exhibits no edema and no tenderness.  Lymphadenopathy:    She has no cervical adenopathy.  Neurological: She is alert.  Skin:  Skin is dry. No rash noted. No erythema. No pallor.    ED Course  Procedures (including critical care time)  Results for orders placed during the hospital encounter of 01/06/11  CBC      Component Value Range   WBC 11.0 (*) 4.0 - 10.5 (K/uL)   RBC 4.22  3.87 - 5.11 (MIL/uL)   Hemoglobin 12.7  12.0 - 15.0 (g/dL)   HCT 16.1  09.6 - 04.5 (%)   MCV 87.2  78.0 - 100.0 (fL)   MCH 30.1  26.0 - 34.0 (pg)   MCHC 34.5  30.0 - 36.0 (g/dL)   RDW 40.9  81.1 - 91.4 (%)   Platelets 221  150 - 400 (K/uL)  DIFFERENTIAL      Component Value Range   Neutrophils Relative 74  43 - 77 (%)   Neutro Abs 8.2 (*) 1.7 - 7.7 (K/uL)   Lymphocytes Relative 17  12 - 46 (%)   Lymphs Abs 1.8  0.7 - 4.0 (K/uL)   Monocytes Relative 7  3 - 12 (%)   Monocytes Absolute 0.8  0.1 - 1.0 (K/uL)   Eosinophils Relative 1  0 - 5 (%)   Eosinophils Absolute 0.2  0.0 - 0.7 (K/uL)   Basophils Relative 0  0 - 1 (%)   Basophils Absolute 0.0  0.0 - 0.1 (K/uL)  URINALYSIS, ROUTINE W REFLEX MICROSCOPIC      Component Value Range   Color, Urine YELLOW  YELLOW    APPearance CLOUDY (*) CLEAR    Specific Gravity, Urine 1.012  1.005 - 1.030    pH 8.0  5.0 - 8.0    Glucose, UA NEGATIVE  NEGATIVE (mg/dL)   Hgb urine dipstick NEGATIVE  NEGATIVE    Bilirubin Urine NEGATIVE  NEGATIVE    Ketones, ur TRACE (*) NEGATIVE (mg/dL)   Protein, ur NEGATIVE  NEGATIVE (mg/dL)   Urobilinogen, UA 0.2  0.0 - 1.0 (mg/dL)   Nitrite NEGATIVE  NEGATIVE    Leukocytes, UA SMALL (*) NEGATIVE   URINE MICROSCOPIC-ADD ON      Component Value Range   Squamous Epithelial / LPF RARE  RARE    WBC, UA 21-50  <3 (WBC/hpf)   Bacteria, UA MANY (*) RARE    Dg Chest 2 View  12/30/2010  *RADIOLOGY REPORT*  Clinical Data: Chest pain.  CHEST - 2 VIEW  Comparison: Chest 05/04/2009.  Findings: Calcified granulomata on the right noted.  Lungs are otherwise clear.  No pneumothorax or pleural effusion.  Heart size upper normal.  IMPRESSION: No acute finding.  Original  Report Authenticated By: Bernadene Bell. Maricela Curet, M.D.      No diagnosis found.    MDM  77 year old female with nausea and vomiting and recent UTI per documentation patient was  placed on Cipro, however urine culture shows resistance to Cipro. It is unclear what antibiotic she was switched to. Patient with clumps of WBCs in her urine today suspect another urinary tract infection we'll check last culture results and treat accordingly.        Olivia Mackie, MD 01/07/11 [phone]

## 2011-01-09 LAB — URINE CULTURE
Colony Count: 70000
Culture  Setup Time: 201211290942

## 2011-01-10 NOTE — ED Notes (Signed)
+   Urine culture. Treated with Septra DS, sensitive to same per protocol MD.

## 2011-10-20 ENCOUNTER — Emergency Department (HOSPITAL_COMMUNITY)
Admission: EM | Admit: 2011-10-20 | Discharge: 2011-10-20 | Disposition: A | Discharge status: Home / Self Care | Payer: Medicare Other | Attending: Emergency Medicine | Admitting: Emergency Medicine

## 2011-10-20 ENCOUNTER — Encounter (HOSPITAL_COMMUNITY): Payer: Self-pay

## 2011-10-20 DIAGNOSIS — G309 Alzheimer's disease, unspecified: Secondary | ICD-10-CM | POA: Insufficient documentation

## 2011-10-20 DIAGNOSIS — Z79899 Other long term (current) drug therapy: Secondary | ICD-10-CM | POA: Insufficient documentation

## 2011-10-20 DIAGNOSIS — I4891 Unspecified atrial fibrillation: Secondary | ICD-10-CM | POA: Insufficient documentation

## 2011-10-20 DIAGNOSIS — F0280 Dementia in other diseases classified elsewhere without behavioral disturbance: Secondary | ICD-10-CM | POA: Insufficient documentation

## 2011-10-20 DIAGNOSIS — F0390 Unspecified dementia without behavioral disturbance: Secondary | ICD-10-CM | POA: Insufficient documentation

## 2011-10-20 DIAGNOSIS — Z7982 Long term (current) use of aspirin: Secondary | ICD-10-CM | POA: Insufficient documentation

## 2011-10-20 DIAGNOSIS — I10 Essential (primary) hypertension: Secondary | ICD-10-CM | POA: Insufficient documentation

## 2011-10-20 HISTORY — DX: Urinary tract infection, site not specified: N39.0

## 2011-10-20 LAB — POCT I-STAT TROPONIN I: Troponin i, poc: 0.01 ng/mL (ref 0.00–0.08)

## 2011-10-20 LAB — URINALYSIS, ROUTINE W REFLEX MICROSCOPIC
Bilirubin Urine: NEGATIVE
Hgb urine dipstick: NEGATIVE
Ketones, ur: NEGATIVE mg/dL
Nitrite: NEGATIVE
Protein, ur: NEGATIVE mg/dL
Urobilinogen, UA: 0.2 mg/dL (ref 0.0–1.0)

## 2011-10-20 LAB — POCT I-STAT, CHEM 8
BUN: 28 mg/dL — ABNORMAL HIGH (ref 6–23)
Calcium, Ion: 1.2 mmol/L (ref 1.13–1.30)
Hemoglobin: 11.9 g/dL — ABNORMAL LOW (ref 12.0–15.0)
Sodium: 139 mEq/L (ref 135–145)
TCO2: 24 mmol/L (ref 0–100)

## 2011-10-20 LAB — CBC
MCH: 30.4 pg (ref 26.0–34.0)
MCHC: 33.1 g/dL (ref 30.0–36.0)
MCV: 91.8 fL (ref 78.0–100.0)
Platelets: 230 10*3/uL (ref 150–400)
RBC: 3.78 MIL/uL — ABNORMAL LOW (ref 3.87–5.11)
RDW: 13.1 % (ref 11.5–15.5)

## 2011-10-20 NOTE — ED Notes (Signed)
Placed call to [facility] fpr transport back home

## 2011-10-20 NOTE — ED Provider Notes (Signed)
History     CSN: [id number]  Arrival date & time 10/20/11  1221   First MD Initiated Contact with Patient 10/20/11 1252      Chief Complaint  Patient presents with  . Weakness    (Consider location/radiation/quality/duration/timing/severity/associated sxs/prior treatment) Patient is a 78 y.o. female presenting with weakness. The history is provided by a relative. The history is limited by the condition of the patient.  Weakness  Additional symptoms include weakness.   the patient is an 78 year old, female, with profound dementia, who lives in a assisted living facility.  Past Medical History  Diagnosis Date  . Atrial fibrillation   . Unspecified essential hypertension   . Alzheimer disease   . UTI (urinary tract infection)     Past Surgical History  Procedure Date  . Bladder suspension 1953  . Cesarean section 1961  . Abdominal hysterectomy 1964  . Bladder surgery 1977  . Cataract extraction 2003    left eye    Family History  Problem Relation Age of Onset  . Other Mother     lung and heart problems  . Other Father     pace maker, heart problems  . Hypertension Brother   . Other Brother     CABG x3  . Hypertension Sister     History  Substance Use Topics  . Smoking status: Never Smoker   . Smokeless tobacco: Not on file  . Alcohol Use: No    OB History    Grav Para Term Preterm Abortions TAB SAB Ect Mult Living                  Review of Systems  Unable to perform ROS: Dementia  Neurological: Positive for weakness.    Allergies  Amoxicillin-pot clavulanate; Codeine; Lidocaine; and Nitrofurantoin  Home Medications   Current Outpatient Rx  Name Route Sig Dispense Refill  . ACETAMINOPHEN 325 MG PO TABS Oral Take 650 mg by mouth every 4 (four) hours as needed. HEADACHE.    . ASPIRIN 81 MG PO CHEW Oral Chew 81 mg by mouth daily.    Marland Kitchen BENAZEPRIL HCL 20 MG PO TABS Oral Take 20 mg by mouth daily.      Marland Kitchen BISACODYL 10 MG RE SUPP Rectal Place 10 mg  rectally daily as needed. For constipation    . ENSURE IMMUNE HEALTH PO LIQD Oral Take 237 mLs by mouth 2 (two) times daily. Vanilla flavor     . LEVOTHYROXINE SODIUM 75 MCG PO TABS Oral Take 75 mcg by mouth daily.     Marland Kitchen METOPROLOL SUCCINATE ER 50 MG PO TB24 Oral Take 50 mg by mouth daily.      Marland Kitchen ONDANSETRON HCL 4 MG PO TABS Oral Take 4 mg by mouth every 8 (eight) hours as needed. For nausea/vomiting      BP 121/53  Pulse 65  Temp 98.6 F (37 C) (Oral)  Resp 17  SpO2 100%  Physical Exam  Nursing note and vitals reviewed. Constitutional: She appears well-nourished. No distress.  HENT:  Head: Atraumatic.  Eyes: Conjunctivae normal and EOM are normal.  Neck: Normal range of motion. Neck supple. No JVD present.  Cardiovascular: Normal rate and intact distal pulses.   Murmur heard. Pulmonary/Chest: Effort normal and breath sounds normal. She has no rales.  Abdominal: Soft. She exhibits no distension. There is no tenderness.  Musculoskeletal: Normal range of motion.  Neurological: She is alert. No cranial nerve deficit.       Confused at  baseline, according to daughter at the bedside  Skin: Skin is warm and dry.  Psychiatric:       Confused at baseline, according to her daughter, who is at the bedside    ED Course  Procedures (including critical care time)   Labs Reviewed  URINALYSIS, ROUTINE W REFLEX MICROSCOPIC  CBC   No results found.   No diagnosis found.    MDM  Dementia        Cheri Guppy, MD 10/20/11 [phone]

## 2011-10-20 NOTE — ED Notes (Signed)
Nursing home reports that pt was weak and not responding as normal this am.  Has had recent UTI and is on antibiotics. Pt is alert and oriented on arrival to ED daughter (works here and states pt is at baseline)

## 2011-10-20 NOTE — ED Notes (Signed)
Discharged instructions reviewed with pt and daughter.  Pt given grahm crackers and apple juice.  Pt dressed. Pt denies pain or shob vss

## 2011-10-20 NOTE — ED Notes (Signed)
Pt I & O cath per daughter request for UA. Pt tolerated well.

## 2011-10-20 NOTE — ED Notes (Signed)
Report given to [facility] at [facility]

## 2011-12-16 ENCOUNTER — Encounter: Payer: Self-pay | Admitting: Cardiology

## 2011-12-16 ENCOUNTER — Ambulatory Visit (INDEPENDENT_AMBULATORY_CARE_PROVIDER_SITE_OTHER): Payer: Medicare Other | Admitting: Cardiology

## 2011-12-16 VITALS — BP 122/80 | HR 68 | Ht 62.0 in | Wt 150.0 lb

## 2011-12-16 DIAGNOSIS — F0280 Dementia in other diseases classified elsewhere without behavioral disturbance: Secondary | ICD-10-CM

## 2011-12-16 DIAGNOSIS — I10 Essential (primary) hypertension: Secondary | ICD-10-CM

## 2011-12-16 DIAGNOSIS — I4891 Unspecified atrial fibrillation: Secondary | ICD-10-CM

## 2011-12-16 NOTE — Assessment & Plan Note (Signed)
No clinical recurrence though she has had some chest pains and maybe some palpitations that taken her to the emergency room. I have reiterated on the written evaluation for [facility] that she is a DO NOT RESUSCITATE and not to take her to the emergency room. I've asked her daughter to have her talk with them as well.

## 2011-12-16 NOTE — Progress Notes (Signed)
HPI Kaitlyn Lucero comes in with her daughter [name] for followup of her paroxysmal A. fib and hypertension. She is now at [facility]. They have sent her to the emergency room several times even though she is a DO NOT RESUSCITATE for chest pain or palpitations. Her daughter is very upset about this.  She is very demented and cannot give an accurate history.  Past Medical History  Diagnosis Date  . Atrial fibrillation   . Unspecified essential hypertension   . Alzheimer disease   . UTI (urinary tract infection)     Current Outpatient Prescriptions  Medication Sig Dispense Refill  . acetaminophen (TYLENOL) 325 MG tablet Take 650 mg by mouth every 4 (four) hours as needed. HEADACHE.      Marland Kitchen aspirin 81 MG chewable tablet Chew 81 mg by mouth daily.      . benazepril (LOTENSIN) 20 MG tablet Take 20 mg by mouth daily.        . bisacodyl (DULCOLAX) 10 MG suppository Place 10 mg rectally daily as needed. For constipation      . feeding supplement (ENSURE IMMUNE HEALTH) LIQD Take 237 mLs by mouth 2 (two) times daily. Vanilla flavor       . levothyroxine (SYNTHROID, LEVOTHROID) 75 MCG tablet Take 75 mcg by mouth daily.       . metoprolol (TOPROL-XL) 50 MG 24 hr tablet Take 50 mg by mouth daily.        . ondansetron (ZOFRAN) 4 MG tablet Take 4 mg by mouth every 8 (eight) hours as needed. For nausea/vomiting        Allergies  Allergen Reactions  . Amoxicillin-Pot Clavulanate   . Codeine   . Lidocaine   . Nitrofurantoin     Family History  Problem Relation Age of Onset  . Other Mother     lung and heart problems  . Other Father     pace maker, heart problems  . Hypertension Brother   . Other Brother     CABG x3  . Hypertension Sister     History   Social History  . Marital Status: Widowed    Spouse Name: N/A    Number of Children: N/A  . Years of Education: N/A   Occupational History  . retired    Social History Main Topics  . Smoking status: Never Smoker   . Smokeless  tobacco: Not on file  . Alcohol Use: No  . Drug Use: No  . Sexually Active: Not on file   Other Topics Concern  . Not on file   Social History Narrative  . No narrative on file    ROS ALL NEGATIVE EXCEPT THOSE NOTED IN HPI  PE  General Appearance: well developed, well nourished in no acute distress, frail HEENT: symmetrical face, PERRLA, good dentition  Neck: no JVD, thyromegaly, or adenopathy, trachea midline Chest: symmetric without deformity Cardiac: PMI non-displaced, RRR, normal S1, S2, no gallop or murmur Lung: clear to ausculation and percussion Vascular: all pulses full without bruits  Abdominal: nondistended, nontender, good bowel sounds, no HSM, no bruits Extremities: no cyanosis, clubbing, 1+ bilateral edema, not repeated no sign of DVT, no varicosities  Skin: normal color, no rashes Neuro: alert and oriented x 3, non-focal Pysch: normal affect  EKG  BMET    Component Value Date/Time   NA 139 10/20/2011 1404   K 3.8 10/20/2011 1404   CL 105 10/20/2011 1404   CO2 23 01/07/2011 0015   GLUCOSE 116* 10/20/2011 1404  BUN 28* 10/20/2011 1404   CREATININE 1.10 10/20/2011 1404   CALCIUM 9.2 01/07/2011 0015   GFRNONAA 73* 01/07/2011 0015   GFRAA 85* 01/07/2011 0015    Lipid Panel  No results found for this basename: chol, trig, hdl, cholhdl, vldl, ldlcalc    CBC    Component Value Date/Time   WBC 9.8 10/20/2011 1350   RBC 3.78* 10/20/2011 1350   HGB 11.9* 10/20/2011 1404   HCT 35.0* 10/20/2011 1404   PLT 230 10/20/2011 1350   MCV 91.8 10/20/2011 1350   MCH 30.4 10/20/2011 1350   MCHC 33.1 10/20/2011 1350   RDW 13.1 10/20/2011 1350   LYMPHSABS 1.8 01/07/2011 0015   MONOABS 0.8 01/07/2011 0015   EOSABS 0.2 01/07/2011 0015   BASOSABS 0.0 01/07/2011 0015

## 2011-12-16 NOTE — Patient Instructions (Addendum)
Your physician recommends that you continue on your current medications as directed. Please refer to the Current Medication list given to you today.  Your physician recommends that you schedule a follow-up appointment as needed with Dr. Daleen Squibb.

## 2012-04-07 ENCOUNTER — Inpatient Hospital Stay (HOSPITAL_COMMUNITY)
Admission: EM | Admit: 2012-04-07 | Discharge: 2012-04-10 | DRG: 689 | Disposition: A | Discharge status: Skilled Nursing Facility | Payer: Medicare Other | Attending: Internal Medicine | Admitting: Internal Medicine

## 2012-04-07 ENCOUNTER — Emergency Department (HOSPITAL_COMMUNITY): Payer: Medicare Other

## 2012-04-07 ENCOUNTER — Encounter (HOSPITAL_COMMUNITY): Payer: Self-pay | Admitting: Emergency Medicine

## 2012-04-07 DIAGNOSIS — E039 Hypothyroidism, unspecified: Secondary | ICD-10-CM | POA: Diagnosis present

## 2012-04-07 DIAGNOSIS — G9349 Other encephalopathy: Secondary | ICD-10-CM | POA: Diagnosis present

## 2012-04-07 DIAGNOSIS — Z881 Allergy status to other antibiotic agents status: Secondary | ICD-10-CM

## 2012-04-07 DIAGNOSIS — R4182 Altered mental status, unspecified: Secondary | ICD-10-CM

## 2012-04-07 DIAGNOSIS — Z8249 Family history of ischemic heart disease and other diseases of the circulatory system: Secondary | ICD-10-CM

## 2012-04-07 DIAGNOSIS — Z7982 Long term (current) use of aspirin: Secondary | ICD-10-CM

## 2012-04-07 DIAGNOSIS — I10 Essential (primary) hypertension: Secondary | ICD-10-CM | POA: Diagnosis present

## 2012-04-07 DIAGNOSIS — Z888 Allergy status to other drugs, medicaments and biological substances status: Secondary | ICD-10-CM

## 2012-04-07 DIAGNOSIS — N390 Urinary tract infection, site not specified: Principal | ICD-10-CM | POA: Diagnosis present

## 2012-04-07 DIAGNOSIS — I4891 Unspecified atrial fibrillation: Secondary | ICD-10-CM | POA: Diagnosis present

## 2012-04-07 DIAGNOSIS — R5081 Fever presenting with conditions classified elsewhere: Secondary | ICD-10-CM | POA: Diagnosis present

## 2012-04-07 DIAGNOSIS — A498 Other bacterial infections of unspecified site: Secondary | ICD-10-CM | POA: Diagnosis present

## 2012-04-07 DIAGNOSIS — Z79899 Other long term (current) drug therapy: Secondary | ICD-10-CM

## 2012-04-07 DIAGNOSIS — F0280 Dementia in other diseases classified elsewhere without behavioral disturbance: Secondary | ICD-10-CM | POA: Diagnosis present

## 2012-04-07 DIAGNOSIS — G9340 Encephalopathy, unspecified: Secondary | ICD-10-CM | POA: Diagnosis present

## 2012-04-07 DIAGNOSIS — G309 Alzheimer's disease, unspecified: Secondary | ICD-10-CM | POA: Diagnosis present

## 2012-04-07 LAB — COMPREHENSIVE METABOLIC PANEL
ALT: 14 U/L (ref 0–35)
Albumin: 4 g/dL (ref 3.5–5.2)
Alkaline Phosphatase: 89 U/L (ref 39–117)
BUN: 19 mg/dL (ref 6–23)
Calcium: 9.5 mg/dL (ref 8.4–10.5)
GFR calc Af Amer: 72 mL/min — ABNORMAL LOW (ref >90)
Glucose, Bld: 120 mg/dL — ABNORMAL HIGH (ref 70–99)
Potassium: 4.1 mEq/L (ref 3.5–5.1)
Sodium: 138 mEq/L (ref 135–145)
Total Protein: 7.7 g/dL (ref 6.0–8.3)

## 2012-04-07 LAB — URINALYSIS, ROUTINE W REFLEX MICROSCOPIC
Bilirubin Urine: NEGATIVE
Nitrite: NEGATIVE
Specific Gravity, Urine: 1.011 (ref 1.005–1.030)
Urobilinogen, UA: 0.2 mg/dL (ref 0.0–1.0)
pH: 7.5 (ref 5.0–8.0)

## 2012-04-07 LAB — TROPONIN I: Troponin I: <0.3 ng/mL (ref <0.30)

## 2012-04-07 LAB — CBC WITH DIFFERENTIAL/PLATELET
Basophils Relative: 1 % (ref 0–1)
Eosinophils Absolute: 0.1 10*3/uL (ref 0.0–0.7)
Eosinophils Relative: 1 % (ref 0–5)
MCH: 29.9 pg (ref 26.0–34.0)
MCHC: 32.9 g/dL (ref 30.0–36.0)
MCV: 90.9 fL (ref 78.0–100.0)
Neutrophils Relative %: 88 % — ABNORMAL HIGH (ref 43–77)
Platelets: 201 10*3/uL (ref 150–400)

## 2012-04-07 LAB — LIPASE, BLOOD: Lipase: 32 U/L (ref 11–59)

## 2012-04-07 LAB — URINE MICROSCOPIC-ADD ON

## 2012-04-07 LAB — PROTIME-INR
INR: 0.99 (ref 0.00–1.49)
Prothrombin Time: 13 seconds (ref 11.6–15.2)

## 2012-04-07 MED ORDER — ALUM & MAG HYDROXIDE-SIMETH 200-200-20 MG/5ML PO SUSP: 30.0000 mL | Freq: Four times a day (QID) | ORAL | Status: DC | PRN

## 2012-04-07 MED ORDER — DEXTROSE 5 % IV SOLN
1.0000 g | INTRAVENOUS | Status: DC
  Administered 2012-04-08 – 2012-04-10 (×3): 1 g via INTRAVENOUS
  Filled 2012-04-07 (×3): qty 10

## 2012-04-07 MED ORDER — SODIUM CHLORIDE 0.9 % IV BOLUS (SEPSIS)
1000.0000 mL | Freq: Once | INTRAVENOUS | Status: AC
  Administered 2012-04-07: 1000 mL via INTRAVENOUS

## 2012-04-07 MED ORDER — ACETAMINOPHEN 325 MG PO TABS
650.0000 mg | ORAL_TABLET | Freq: Four times a day (QID) | ORAL | Status: DC | PRN
  Administered 2012-04-08: 650 mg via ORAL
  Filled 2012-04-07: qty 2

## 2012-04-07 MED ORDER — METOPROLOL SUCCINATE ER 50 MG PO TB24
50.0000 mg | ORAL_TABLET | Freq: Every day | ORAL | Status: DC
  Administered 2012-04-08 – 2012-04-09 (×2): 50 mg via ORAL
  Filled 2012-04-07 (×2): qty 1

## 2012-04-07 MED ORDER — ENOXAPARIN SODIUM 40 MG/0.4ML ~~LOC~~ SOLN
40.0000 mg | SUBCUTANEOUS | Status: DC
  Administered 2012-04-08 – 2012-04-10 (×3): 40 mg via SUBCUTANEOUS
  Filled 2012-04-07 (×3): qty 0.4

## 2012-04-07 MED ORDER — SODIUM CHLORIDE 0.9 % IV SOLN
INTRAVENOUS | Status: DC
  Administered 2012-04-07: 22:00:00 via INTRAVENOUS

## 2012-04-07 MED ORDER — ZOLPIDEM TARTRATE 5 MG PO TABS: 5.0000 mg | ORAL_TABLET | Freq: Every evening | ORAL | Status: DC | PRN

## 2012-04-07 MED ORDER — LEVOTHYROXINE SODIUM 75 MCG PO TABS
75.0000 ug | ORAL_TABLET | Freq: Every day | ORAL | Status: DC
  Administered 2012-04-08 – 2012-04-10 (×3): 75 ug via ORAL
  Filled 2012-04-07 (×4): qty 1

## 2012-04-07 MED ORDER — BENAZEPRIL HCL 20 MG PO TABS
20.0000 mg | ORAL_TABLET | Freq: Every day | ORAL | Status: DC
  Administered 2012-04-08 – 2012-04-09 (×2): 20 mg via ORAL
  Filled 2012-04-07 (×2): qty 1

## 2012-04-07 MED ORDER — ONDANSETRON HCL 4 MG/2ML IJ SOLN: 4.0000 mg | Freq: Three times a day (TID) | INTRAMUSCULAR | Status: DC | PRN

## 2012-04-07 MED ORDER — BOOST PLUS PO LIQD
237.0000 mL | Freq: Two times a day (BID) | ORAL | Status: DC
  Administered 2012-04-07 – 2012-04-10 (×6): 237 mL via ORAL
  Filled 2012-04-07 (×8): qty 237

## 2012-04-07 MED ORDER — OXYCODONE HCL 5 MG PO TABS: 5.0000 mg | ORAL_TABLET | ORAL | Status: DC | PRN

## 2012-04-07 MED ORDER — VANCOMYCIN HCL IN DEXTROSE 1-5 GM/200ML-% IV SOLN: 1000.0000 mg | Freq: Once | INTRAVENOUS | Status: DC

## 2012-04-07 MED ORDER — ACETAMINOPHEN 650 MG RE SUPP
650.0000 mg | Freq: Once | RECTAL | Status: AC
  Administered 2012-04-07: 650 mg via RECTAL
  Filled 2012-04-07: qty 1

## 2012-04-07 MED ORDER — ONDANSETRON HCL 4 MG PO TABS: 4.0000 mg | ORAL_TABLET | Freq: Four times a day (QID) | ORAL | Status: DC | PRN

## 2012-04-07 MED ORDER — ACETAMINOPHEN 650 MG RE SUPP: 650.0000 mg | Freq: Four times a day (QID) | RECTAL | Status: DC | PRN

## 2012-04-07 MED ORDER — ACETAMINOPHEN 325 MG PO TABS
650.0000 mg | ORAL_TABLET | Freq: Once | ORAL | Status: DC
  Filled 2012-04-07: qty 2

## 2012-04-07 MED ORDER — DEXTROSE 5 % IV SOLN
1.0000 g | Freq: Once | INTRAVENOUS | Status: AC
  Administered 2012-04-07: 1 g via INTRAVENOUS
  Filled 2012-04-07: qty 10

## 2012-04-07 MED ORDER — ONDANSETRON HCL 4 MG PO TABS: 4.0000 mg | ORAL_TABLET | Freq: Three times a day (TID) | ORAL | Status: DC | PRN

## 2012-04-07 MED ORDER — HYDROMORPHONE HCL PF 1 MG/ML IJ SOLN: 0.5000 mg | INTRAMUSCULAR | Status: DC | PRN

## 2012-04-07 MED ORDER — SODIUM CHLORIDE 0.9 % IV SOLN
INTRAVENOUS | Status: DC
Start: 2012-04-07 — End: 2012-04-10
  Administered 2012-04-08 – 2012-04-09 (×3): via INTRAVENOUS

## 2012-04-07 MED ORDER — ASPIRIN 81 MG PO CHEW
81.0000 mg | CHEWABLE_TABLET | Freq: Every day | ORAL | Status: DC
  Administered 2012-04-08 – 2012-04-10 (×3): 81 mg via ORAL
  Filled 2012-04-07 (×3): qty 1

## 2012-04-07 MED ORDER — ONDANSETRON HCL 4 MG/2ML IJ SOLN: 4.0000 mg | Freq: Four times a day (QID) | INTRAMUSCULAR | Status: DC | PRN

## 2012-04-07 NOTE — ED Notes (Addendum)
Kaitlyn Lucero [phone] (Cell) [phone] (home)  Daughter would like to be notified if anything happens to pt and for updates.

## 2012-04-07 NOTE — Progress Notes (Signed)
Pt arrived from ED via stretcher. Disoriented x4. IVF infusing with NS at 100. Bed alarm set and adult brief removed. Telemetry placed on pt. Pt in camera room. Will continue to monitor.

## 2012-04-07 NOTE — ED Provider Notes (Signed)
History     CSN: [id number]  Arrival date & time 04/07/12  1632   First MD Initiated Contact with Patient 04/07/12 1644      Chief Complaint  Patient presents with  . Altered Mental Status    (Consider location/radiation/quality/duration/timing/severity/associated sxs/prior treatment) HPI The patient presents with altered mental status.  She does have dementia, is incapable of providing history of present illness.  History of present illness is provided per the patient's daughter.  The patient's daughter states that the patient was in her usual state of health until today.  She was at her nursing facility, she was found listless, aphasic, non-interactive, which is very different from baseline.  No report of fall, loss of consciousness, vomiting, diarrhea. The patient states that everything hurts.  She is otherwise noncontributory. Past Medical History  Diagnosis Date  . Atrial fibrillation   . Unspecified essential hypertension   . Alzheimer disease   . UTI (urinary tract infection)     Past Surgical History  Procedure Laterality Date  . Bladder suspension  1953  . Cesarean section  1961  . Abdominal hysterectomy  1964  . Bladder surgery  1977  . Cataract extraction  2003    left eye    Family History  Problem Relation Age of Onset  . Other Mother     lung and heart problems  . Other Father     pace maker, heart problems  . Hypertension Brother   . Other Brother     CABG x3  . Hypertension Sister     History  Substance Use Topics  . Smoking status: Never Smoker   . Smokeless tobacco: Not on file  . Alcohol Use: No    OB History   Grav Para Term Preterm Abortions TAB SAB Ect Mult Living                  Review of Systems  Unable to perform ROS: Dementia    Allergies  Amoxicillin-pot clavulanate; Codeine; Lidocaine; and Nitrofurantoin  Home Medications   Current Outpatient Rx  Name  Route  Sig  Dispense  Refill  . aspirin 81 MG chewable tablet  Oral   Chew 81 mg by mouth daily.         . benazepril (LOTENSIN) 20 MG tablet   Oral   Take 20 mg by mouth daily.           . feeding supplement (ENSURE IMMUNE HEALTH) LIQD   Oral   Take 237 mLs by mouth 2 (two) times daily. Vanilla flavor          . levothyroxine (SYNTHROID, LEVOTHROID) 75 MCG tablet   Oral   Take 75 mcg by mouth daily.          . metoprolol (TOPROL-XL) 50 MG 24 hr tablet   Oral   Take 50 mg by mouth daily.             There were no vitals taken for this visit.  Physical Exam  Nursing note and vitals reviewed. Constitutional: She appears well-developed and well-nourished. No distress.  HENT:  Head: Normocephalic and atraumatic.  Eyes: Conjunctivae and EOM are normal.  Cardiovascular: Normal rate and regular rhythm.   Pulmonary/Chest: Effort normal and breath sounds normal. No stridor. No respiratory distress.  Abdominal: Normal appearance. She exhibits no distension. There is generalized tenderness. There is no rigidity, no rebound and no guarding.  Musculoskeletal: She exhibits no edema.  Neurological: She is alert. No  cranial nerve deficit.  Skin: Skin is warm and dry.  Psychiatric: She has a normal mood and affect.    ED Course  Procedures (including critical care time)  Labs Reviewed  CBC WITH DIFFERENTIAL  COMPREHENSIVE METABOLIC PANEL  LIPASE, BLOOD  URINALYSIS, ROUTINE W REFLEX MICROSCOPIC  TROPONIN I  PROTIME-INR  LACTIC ACID, PLASMA   No results found.   No diagnosis found.   Date: 04/08/2012  Rate: 96  Rhythm: normal sinus rhythm  QRS Axis: left  Intervals: normal  ST/T Wave abnormalities: nonspecific ST/T changes  Conduction Disutrbances:significant artefact  Narrative Interpretation:   Old EKG Reviewed: changes noted Artefact - abnormal  O2- 95%ra ,borderline Cardiac: 90sr, normal MDM  This elderly female presents with altered mental status.  Notably, the patient is demented, incapable of providing  significant aspects of the history of present illness.  Given concern for infection, with fever in the emergency department she was treated with intravenous antibiotics and labs subsequently demonstrated urinary tract infection.  The patient was admitted for further evaluation and management.       Gerhard Munch, MD 04/08/12 1113

## 2012-04-07 NOTE — H&P (Signed)
[facility] History and Physical  Kaitlyn Lucero RUE:[id number] DOB: Jul 08, 1921 DOA: 04/07/2012  Referring physician: EDP PCP: Kaitlyn Alken, MD  Specialists:   Chief Complaint: Fever  HPI: Kaitlyn Lucero is a 79 y.o. female resident from [facility] who was sent to the ED due to fevers and increased confusion.   Patient does have a history of Dementia and can not give a history.   She Per the facility she began to have fevers today, and was becoming lethargic.  She also had poor intake of foods and liquids today as well.   In the ED, she was found to have fever of 101.9 rectally, and a UA was done and found to be positive.   She was initially placed on IV Vancomycin and Zosyn and referred for medical admission.        Review of Systems: Unable to Obtain from the Patient.         Past Medical History  Diagnosis Date  . Atrial fibrillation   . Unspecified essential hypertension   . Alzheimer disease   . UTI (urinary tract infection)    Past Surgical History  Procedure Laterality Date  . Bladder suspension  1953  . Cesarean section  1961  . Abdominal hysterectomy  1964  . Bladder surgery  1977  . Cataract extraction  2003    left eye    Medications:  HOME MEDS: Prior to Admission medications   Medication Sig Start Date End Date Taking? Authorizing Provider  acetaminophen (TYLENOL) 325 MG tablet Take 650 mg by mouth every 4 (four) hours as needed (headache).   Yes Historical Provider, MD  aspirin 81 MG chewable tablet Chew 81 mg by mouth daily.   Yes Historical Provider, MD  benazepril (LOTENSIN) 20 MG tablet Take 20 mg by mouth daily.     Yes Historical Provider, MD  bisacodyl (DULCOLAX) 10 MG suppository Place 10 mg rectally daily as needed for constipation.   Yes Historical Provider, MD  feeding supplement (ENSURE IMMUNE HEALTH) LIQD Take 237 mLs by mouth 2 (two) times daily. Vanilla flavor    Yes Historical Provider, MD   guaiFENesin-dextromethorphan (ROBITUSSIN DM) 100-10 MG/5ML syrup Take 10 mLs by mouth 3 (three) times daily as needed for cough.   Yes Historical Provider, MD  levothyroxine (SYNTHROID, LEVOTHROID) 75 MCG tablet Take 75 mcg by mouth daily.    Yes Historical Provider, MD  metoprolol (TOPROL-XL) 50 MG 24 hr tablet Take 50 mg by mouth daily.     Yes Historical Provider, MD  ondansetron (ZOFRAN) 4 MG tablet Take 4 mg by mouth every 8 (eight) hours as needed for nausea.   Yes Historical Provider, MD    Allergies:  Allergies  Allergen Reactions  . Amoxicillin-Pot Clavulanate   . Codeine   . Lidocaine   . Nitrofurantoin     Social History:   reports that she has never smoked. She does not have any smokeless tobacco history on file. She reports that she does not drink alcohol or use illicit drugs.  Family History: Family History  Problem Relation Age of Onset  . Other Mother     lung and heart problems  . Other Father     pace maker, heart problems  . Hypertension Brother   . Other Brother     CABG x3  . Hypertension Sister      Physical Exam:  GEN:  Obtunded 79 year old Obese Elderly Caucasian Female examined  and in no acute distress;  Filed Vitals:   04/07/12 1917 04/07/12 2000 04/07/12 2020 04/07/12 2100  BP: 161/87 165/107  148/129  Pulse:  102  94  Temp:   101.9 F (38.8 C)   TempSrc:   Rectal   Resp: 24 26  26   SpO2:  96%  96%   Blood pressure 148/129, pulse 94, temperature 101.9 F (38.8 C), temperature source Rectal, resp. rate 26, SpO2 96.00%. PSYCH: She is alert and oriented x1; does not appear anxious does not appear depressed; affect is normal HEENT: Normocephalic and Atraumatic, Mucous membranes pink; PERRLA; EOM intact; Fundi:  Benign;  No scleral icterus, Nares: Patent, Oropharynx: Clear,  Neck:  FROM, no cervical lymphadenopathy nor thyromegaly or carotid bruit; no JVD; Breasts:: Not examined CHEST WALL: No tenderness CHEST: Normal respiration, clear to  auscultation bilaterally HEART: Regular rate and rhythm; no murmurs rubs or gallops BACK: No kyphosis or scoliosis; no CVA tenderness ABDOMEN: Positive Bowel Sounds,  Obese, soft non-tender; no masses, no organomegaly.    Rectal Exam: Not done EXTREMITIES: No cyanosis, clubbing or edema; no ulcerations. Genitalia: not examined PULSES: 2+ and symmetric SKIN: Normal hydration no rash or ulceration CNS: Cranial nerves 2-12 grossly intact no focal neurologic deficit    Labs on Admission:  Basic Metabolic Panel:  Recent Labs Lab 04/07/12 1758  NA 138  K 4.1  CL 102  CO2 25  GLUCOSE 120*  BUN 19  CREATININE 0.82  CALCIUM 9.5   Liver Function Tests:  Recent Labs Lab 04/07/12 1758  AST 19  ALT 14  ALKPHOS 89  BILITOT 0.3  PROT 7.7  ALBUMIN 4.0    Recent Labs Lab 04/07/12 1758  LIPASE 32   No results found for this basename: AMMONIA,  in the last 168 hours CBC:  Recent Labs Lab 04/07/12 1758  WBC 9.2  NEUTROABS 8.1*  HGB 13.1  HCT 39.8  MCV 90.9  PLT 201   Cardiac Enzymes:  Recent Labs Lab 04/07/12 1759  TROPONINI <0.30    BNP (last 3 results) No results found for this basename: PROBNP,  in the last 8760 hours CBG: No results found for this basename: GLUCAP,  in the last 168 hours  Radiological Exams on Admission: Ct Head Wo Contrast  04/07/2012  *RADIOLOGY REPORT*  Clinical Data: 79 year old female generalized weakness and stopped and the awaiting.  CT HEAD WITHOUT CONTRAST  Technique:  Contiguous axial images were obtained from the base of the skull through the vertex without contrast.  Comparison: 05/02/2008.  Findings: Stable paranasal sinuses and mastoids. No acute osseous abnormality identified.  No acute orbit or scalp soft tissue findings.  Calcified atherosclerosis at the skull base.  No suspicious intracranial vascular hyperdensity.  Interval generalized cerebral volume loss.  Mild to moderate ex vacuo enlargement of the ventricles.  Patchy  periventricular white matter hypodensity also mildly progressed. No midline shift, mass effect, or evidence of mass lesion.  No acute intracranial hemorrhage identified.  No evidence of cortically based acute infarction identified.  IMPRESSION: No acute intracranial abnormality.  Further generalized cerebral volume loss and mildly increased nonspecific white matter changes since 2010.   Original Report Authenticated By: Erskine Speed, M.D.     EKG: Independently reviewed.   Assessment/Plan Active Problems:   Acute encephalopathy   UTI (lower urinary tract infection)   Fever presenting with conditions classified elsewhere  1.   Acute Encephalopathy-  Due to UTI,  Given VAnc and Zosyn in ED, changed to Rocpehin , and Urine C+S pending.  2.   UTI-   Continue  IV Rocephin , adjust PRN Urine culture Results.     3.   Fever-  Tylenol PRN, should improve as UTI  Improves.  IVFs for hydration.     4.  Lovenox for DVT prophylaxis  5.  Other- Medications Reconciled.       Code Status:   DNR Family Communication:  No Family Present Disposition Plan:  Return to SNF  Time spent:  83 Minutes  Ron Parker [facility] Pager [phone]  If 7PM-7AM, please contact night-coverage [email or web address] Password [facility] 04/07/2012, 10:32 PM

## 2012-04-07 NOTE — ED Notes (Signed)
Pt confused and unable to assess if she is in pain, or where it is located. Does not follow commands very well.

## 2012-04-07 NOTE — ED Provider Notes (Signed)
Kaitlyn Lucero S 8:00 PM patient discussed in sign out with Dr. Jeraldine Loots. Patient with new altered mental status. Labs and head CT unremarkable. UA pending.   UA was signs of infection. Patient is febrile. Antibiotics ordered. Will also add on blood cultures. Plan for admission.  9:15 PM spoke with [facility]. They will see patient and admit to med surge bed under team 10. Would like only Rocephin given. Will DC vancomycin.     Angus Seller, [place] 04/07/12 2118

## 2012-04-07 NOTE — ED Notes (Signed)
Per Pt's daughter pt's cough has began to get increasingly worse, facility called her and stated that pt was not as active and social as usual. Pt, had generalized weakness and stopped ambulating.

## 2012-04-07 NOTE — ED Notes (Signed)
Per EMS pt came from [facility] for Altered Mental Status. Per daughter pt has had bilateral weakness in upper extremities, and speech was not normal. Daughter did not want pt to be transported to [facility], wanted pt to go to [facility], Daughter does not want pt to receive TPA if possible stroke. Pt has hx of Alzheimers, Hypothyroidism, A-fib, and HTN. 20g LAC, vitals: 180/76, cbg 81.

## 2012-04-08 ENCOUNTER — Inpatient Hospital Stay (HOSPITAL_COMMUNITY): Payer: Medicare Other

## 2012-04-08 LAB — BASIC METABOLIC PANEL
BUN: 14 mg/dL (ref 6–23)
Calcium: 8.1 mg/dL — ABNORMAL LOW (ref 8.4–10.5)
Creatinine, Ser: 0.74 mg/dL (ref 0.50–1.10)
GFR calc Af Amer: 85 mL/min — ABNORMAL LOW (ref >90)
GFR calc non Af Amer: 74 mL/min — ABNORMAL LOW (ref >90)

## 2012-04-08 LAB — CBC
MCHC: 33.7 g/dL (ref 30.0–36.0)
Platelets: 167 10*3/uL (ref 150–400)
RDW: 13.2 % (ref 11.5–15.5)

## 2012-04-08 MED ORDER — CHLORHEXIDINE GLUCONATE CLOTH 2 % EX PADS
6.0000 | MEDICATED_PAD | Freq: Every day | CUTANEOUS | Status: DC
  Administered 2012-04-08 – 2012-04-10 (×3): 6 via TOPICAL

## 2012-04-08 MED ORDER — MUPIROCIN 2 % EX OINT
1.0000 "application " | TOPICAL_OINTMENT | Freq: Two times a day (BID) | CUTANEOUS | Status: DC
  Administered 2012-04-08 – 2012-04-10 (×6): 1 via NASAL
  Filled 2012-04-08: qty 22

## 2012-04-08 MED ORDER — METOPROLOL TARTRATE 1 MG/ML IV SOLN
5.0000 mg | Freq: Once | INTRAVENOUS | Status: AC
  Administered 2012-04-08: 5 mg via INTRAVENOUS
  Filled 2012-04-08: qty 5

## 2012-04-08 NOTE — Progress Notes (Signed)
PATIENT DETAILS Name: Kaitlyn Lucero Age: 79 y.o. Sex: female Date of Birth: 1921-04-07 Admit Date: 04/07/2012 Admitting Physician Ron Parker, MD ZOX:[id number] Roseanne Reno, MD  Subjective: Sleeping when I walked in-pleasantly confused-easily awakens-doesn't open her eyes-but answers some questions appropriately  Assessment/Plan: Active Problems:   Acute encephalopathy -2/2 to UTI -hopefully with treatment of underlying UTI-her mental status will get back to baseline (per daughter-always confused-but ambulatory-she is currently more withdrawn mentally than her usual baseline) -CT Head on admission-neg for acute abnormalities    UTI (lower urinary tract infection) -c/w empiric Rocephin -await urine and blood cultures done on 2/28  H/o Paroxysmal Afib -EKG on 3/1-showing AFIB-but telemetry showing Sinus -c/w Metoprolol -doubt she is a anticoagulation candidate  HTN -moderate control -c/w Metoprolol and Benazepril  Hypothyroidism -c/w Levothyroxine  Disposition: Remain inpatient  DVT Prophylaxis: Prophylactic Lovenox   Code Status: Full code  Procedures:  None  CONSULTS:  None  PHYSICAL EXAM: Vital signs in last 24 hours: Filed Vitals:   04/07/12 2100 04/07/12 2307 04/08/12 0300 04/08/12 0626  BP: 148/129  149/103 132/89  Pulse: 94  112 85  Temp:   99.4 F (37.4 C) 100.6 F (38.1 C)  TempSrc:   Oral Oral  Resp: 26  20 18   Height:  5\' 8"  (1.727 m)    Weight:  72.8 kg (160 lb 7.9 oz)    SpO2: 96%  96% 100%    Weight change:  Body mass index is 24.41 kg/(m^2).   Gen Exam:sleeping, not in any distress Neck: Supple, No JVD.   Chest: B/L Clear.   CVS: S1 S2 Regular, no murmurs.  Abdomen: soft, BS +, non tender, non distended.  Extremities: no edema, lower extremities warm to touch. Neurologic: Non Focal.   Skin: No Rash.   Wounds: N/A.    Intake/Output from previous day: No intake or output data in the 24 hours ending 04/08/12  0845   LAB RESULTS: CBC  Recent Labs Lab 04/07/12 1758 04/08/12 0500  WBC 9.2 6.6  HGB 13.1 12.0  HCT 39.8 35.6*  PLT 201 167  MCV 90.9 90.4  MCH 29.9 30.5  MCHC 32.9 33.7  RDW 13.1 13.2  LYMPHSABS 0.5*  --   MONOABS 0.5  --   EOSABS 0.1  --   BASOSABS 0.1  --     Chemistries   Recent Labs Lab 04/07/12 1758 04/08/12 0500  NA 138 137  K 4.1 3.6  CL 102 104  CO2 25 21  GLUCOSE 120* 101*  BUN 19 14  CREATININE 0.82 0.74  CALCIUM 9.5 8.1*    CBG: No results found for this basename: GLUCAP,  in the last 168 hours  GFR Estimated Creatinine Clearance: 49 ml/min (by C-G formula based on Cr of 0.74).  Coagulation profile  Recent Labs Lab 04/07/12 1758  INR 0.99    Cardiac Enzymes  Recent Labs Lab 04/07/12 1759  TROPONINI <0.30    No components found with this basename: POCBNP,  No results found for this basename: DDIMER,  in the last 72 hours No results found for this basename: HGBA1C,  in the last 72 hours No results found for this basename: CHOL, HDL, LDLCALC, TRIG, CHOLHDL, LDLDIRECT,  in the last 72 hours No results found for this basename: TSH, T4TOTAL, FREET3, T3FREE, THYROIDAB,  in the last 72 hours No results found for this basename: VITAMINB12, FOLATE, FERRITIN, TIBC, IRON, RETICCTPCT,  in the last 72 hours  Recent Labs  04/07/12 1758  LIPASE 32  Urine Studies No results found for this basename: UACOL, UAPR, USPG, UPH, UTP, UGL, UKET, UBIL, UHGB, UNIT, UROB, ULEU, UEPI, UWBC, URBC, UBAC, CAST, CRYS, UCOM, BILUA,  in the last 72 hours  MICROBIOLOGY: Recent Results (from the past 240 hour(s))  MRSA PCR SCREENING     Status: Abnormal   Collection Time    04/07/12 10:25 PM      Result Value Range Status   MRSA by PCR POSITIVE (*) NEGATIVE Final   Comment:            The GeneXpert MRSA Assay (FDA     approved for NASAL specimens     only), is one component of a     comprehensive MRSA colonization     surveillance program. It is  not     intended to diagnose MRSA     infection nor to guide or     monitor treatment for     MRSA infections.     RESULT CALLED TO, READ BACK BY AND VERIFIED WITH:     Henriette Combs RN [id number] AT 0051 SKEEN,P    RADIOLOGY STUDIES/RESULTS: Ct Head Wo Contrast  04/07/2012  *RADIOLOGY REPORT*  Clinical Data: 79 year old female generalized weakness and stopped and the awaiting.  CT HEAD WITHOUT CONTRAST  Technique:  Contiguous axial images were obtained from the base of the skull through the vertex without contrast.  Comparison: 05/02/2008.  Findings: Stable paranasal sinuses and mastoids. No acute osseous abnormality identified.  No acute orbit or scalp soft tissue findings.  Calcified atherosclerosis at the skull base.  No suspicious intracranial vascular hyperdensity.  Interval generalized cerebral volume loss.  Mild to moderate ex vacuo enlargement of the ventricles.  Patchy periventricular white matter hypodensity also mildly progressed. No midline shift, mass effect, or evidence of mass lesion.  No acute intracranial hemorrhage identified.  No evidence of cortically based acute infarction identified.  IMPRESSION: No acute intracranial abnormality.  Further generalized cerebral volume loss and mildly increased nonspecific white matter changes since 2010.   Original Report Authenticated By: Erskine Speed, M.D.     MEDICATIONS: Scheduled Meds: . sodium chloride   Intravenous STAT  . aspirin  81 mg Oral Daily  . benazepril  20 mg Oral Daily  . cefTRIAXone (ROCEPHIN)  IV  1 g Intravenous Q24H  . Chlorhexidine Gluconate Cloth  6 each Topical Q0600  . enoxaparin (LOVENOX) injection  40 mg Subcutaneous Q24H  . lactose free nutrition  237 mL Oral BID  . levothyroxine  75 mcg Oral QAC breakfast  . metoprolol succinate  50 mg Oral Daily  . mupirocin ointment  1 application Nasal BID   Continuous Infusions: . sodium chloride 75 mL/hr at 04/08/12 0115   PRN Meds:.acetaminophen, acetaminophen, alum & mag  hydroxide-simeth, HYDROmorphone (DILAUDID) injection, ondansetron (ZOFRAN) IV, ondansetron, ondansetron, oxyCODONE, zolpidem  Antibiotics: Anti-infectives   Start     Dose/Rate Route Frequency Ordered Stop   04/07/12 2030  vancomycin (VANCOCIN) IVPB 1000 mg/200 mL premix  Status:  Discontinued     1,000 mg 200 mL/hr over 60 Minutes Intravenous  Once 04/07/12 2023 04/07/12 2115   04/07/12 2030  cefTRIAXone (ROCEPHIN) 1 g in dextrose 5 % 50 mL IVPB     1 g 100 mL/hr over 30 Minutes Intravenous  Once 04/07/12 2023 04/07/12 2148   04/07/12 1000  cefTRIAXone (ROCEPHIN) 1 g in dextrose 5 % 50 mL IVPB     1 g 100 mL/hr over 30 Minutes Intravenous Every 24 hours  04/07/12 2237         Jeoffrey Massed, MD  [facility] Pager:[phone]  If 7PM-7AM, please contact night-coverage [email or web address] Password [email or web address] 04/08/2012, 8:45 AM   LOS: 1 day

## 2012-04-08 NOTE — ED Provider Notes (Signed)
Please see my initial note.  The completion of the patient's case and report were conducted by the physician assistant  Gerhard Munch, MD 04/08/12 1110

## 2012-04-09 LAB — CBC
MCHC: 34.1 g/dL (ref 30.0–36.0)
RDW: 13.1 % (ref 11.5–15.5)

## 2012-04-09 LAB — BASIC METABOLIC PANEL
BUN: 11 mg/dL (ref 6–23)
GFR calc Af Amer: 85 mL/min — ABNORMAL LOW (ref >90)
GFR calc non Af Amer: 74 mL/min — ABNORMAL LOW (ref >90)
Potassium: 3.2 mEq/L — ABNORMAL LOW (ref 3.5–5.1)
Sodium: 136 mEq/L (ref 135–145)

## 2012-04-09 MED ORDER — HYDRALAZINE HCL 20 MG/ML IJ SOLN
10.0000 mg | Freq: Four times a day (QID) | INTRAMUSCULAR | Status: DC | PRN
  Administered 2012-04-09: 10 mg via INTRAVENOUS
  Filled 2012-04-09: qty 0.5

## 2012-04-09 MED ORDER — METOPROLOL SUCCINATE ER 50 MG PO TB24
75.0000 mg | ORAL_TABLET | Freq: Every day | ORAL | Status: DC
  Filled 2012-04-09: qty 1

## 2012-04-09 MED ORDER — BENAZEPRIL HCL 40 MG PO TABS
40.0000 mg | ORAL_TABLET | Freq: Every day | ORAL | Status: DC
  Administered 2012-04-10: 40 mg via ORAL
  Filled 2012-04-09: qty 1

## 2012-04-09 MED ORDER — POTASSIUM CHLORIDE CRYS ER 20 MEQ PO TBCR
40.0000 meq | EXTENDED_RELEASE_TABLET | Freq: Once | ORAL | Status: AC
  Administered 2012-04-09: 40 meq via ORAL
  Filled 2012-04-09: qty 2

## 2012-04-09 NOTE — Progress Notes (Signed)
PATIENT DETAILS Name: Kaitlyn Lucero Age: 79 y.o. Sex: female Date of Birth: Apr 28, 1921 Admit Date: 04/07/2012 Admitting Physician Ron Parker, MD AVW:[id number],JYNWGNFAO STEWART, MD  Subjective: Much more awake-somewhat alert but mostly pleasantly confused   Assessment/Plan: Active Problems:   Acute encephalopathy -2/2 to UTI - Seems to have resolved and the patient is back to her usual baseline. -CT Head on admission-neg for acute abnormalities    UTI (lower urinary tract infection) -c/w empiric Rocephin -await urine and blood cultures done on 2/28- still pending  H/o Paroxysmal Afib -EKG on 3/1-showing AFIB-but telemetry showing Sinus -c/w Metoprolol -doubt she is a anticoagulation candidate  HTN -moderate control - Increase Metoprolol to 75 mg and increase Benazepril to 40 mg- reassess control in the next 24-48 hours  Hypothyroidism -c/w Levothyroxine  Disposition: Remain inpatient- back to SNF likely 3/3  DVT Prophylaxis: Prophylactic Lovenox   Code Status: Full code  Procedures:  None  CONSULTS:  None  PHYSICAL EXAM: Vital signs in last 24 hours: Filed Vitals:   04/08/12 1834 04/08/12 2023 04/09/12 0300 04/09/12 0435  BP: 190/58 156/72 175/69 163/78  Pulse: 77 104 69 84  Temp: 100.4 F (38 C) 99.9 F (37.7 C) 98.7 F (37.1 C) 98.5 F (36.9 C)  TempSrc: Oral Oral Oral Oral  Resp: 20 18 20 20   Height:      Weight:      SpO2: 97% 94% 95% 95%    Weight change:  Body mass index is 24.41 kg/(m^2).   Gen Exam: Much more awake, pleasantly confused, not in any distress Neck: Supple, No JVD.   Chest: B/L Clear.  No rales or rhonchi CVS: S1 S2 Regular, no murmurs.  Abdomen: soft, BS +, non tender, non distended.  Extremities: no edema, lower extremities warm to touch. Neurologic: Non Focal.   Skin: No Rash.   Wounds: N/A.    Intake/Output from previous day:  Intake/Output Summary (Last 24 hours) at 04/09/12 1025 Last data filed at  04/09/12 0850  Gross per 24 hour  Intake 1221.33 ml  Output      0 ml  Net 1221.33 ml     LAB RESULTS: CBC  Recent Labs Lab 04/07/12 1758 04/08/12 0500 04/09/12 0800  WBC 9.2 6.6 4.9  HGB 13.1 12.0 12.3  HCT 39.8 35.6* 36.1  PLT 201 167 144*  MCV 90.9 90.4 90.0  MCH 29.9 30.5 30.7  MCHC 32.9 33.7 34.1  RDW 13.1 13.2 13.1  LYMPHSABS 0.5*  --   --   MONOABS 0.5  --   --   EOSABS 0.1  --   --   BASOSABS 0.1  --   --     Chemistries   Recent Labs Lab 04/07/12 1758 04/08/12 0500 04/09/12 0800  NA 138 137 136  K 4.1 3.6 3.2*  CL 102 104 104  CO2 25 21 22   GLUCOSE 120* 101* 101*  BUN 19 14 11   CREATININE 0.82 0.74 0.74  CALCIUM 9.5 8.1* 8.4    CBG: No results found for this basename: GLUCAP,  in the last 168 hours  GFR Estimated Creatinine Clearance: 49 ml/min (by C-G formula based on Cr of 0.74).  Coagulation profile  Recent Labs Lab 04/07/12 1758  INR 0.99    Cardiac Enzymes  Recent Labs Lab 04/07/12 1759  TROPONINI <0.30    No components found with this basename: POCBNP,  No results found for this basename: DDIMER,  in the last 72 hours No results found for this  basename: HGBA1C,  in the last 72 hours No results found for this basename: CHOL, HDL, LDLCALC, TRIG, CHOLHDL, LDLDIRECT,  in the last 72 hours No results found for this basename: TSH, T4TOTAL, FREET3, T3FREE, THYROIDAB,  in the last 72 hours No results found for this basename: VITAMINB12, FOLATE, FERRITIN, TIBC, IRON, RETICCTPCT,  in the last 72 hours  Recent Labs  04/07/12 1758  LIPASE 32    Urine Studies No results found for this basename: UACOL, UAPR, USPG, UPH, UTP, UGL, UKET, UBIL, UHGB, UNIT, UROB, ULEU, UEPI, UWBC, URBC, UBAC, CAST, CRYS, UCOM, BILUA,  in the last 72 hours  MICROBIOLOGY: Recent Results (from the past 240 hour(s))  MRSA PCR SCREENING     Status: Abnormal   Collection Time    04/07/12 10:25 PM      Result Value Range Status   MRSA by PCR POSITIVE (*)  NEGATIVE Final   Comment:            The GeneXpert MRSA Assay (FDA     approved for NASAL specimens     only), is one component of a     comprehensive MRSA colonization     surveillance program. It is not     intended to diagnose MRSA     infection nor to guide or     monitor treatment for     MRSA infections.     RESULT CALLED TO, READ BACK BY AND VERIFIED WITH:     Henriette Combs RN [id number] AT 0051 SKEEN,P    RADIOLOGY STUDIES/RESULTS: Ct Head Wo Contrast  04/07/2012  *RADIOLOGY REPORT*  Clinical Data: 79 year old female generalized weakness and stopped and the awaiting.  CT HEAD WITHOUT CONTRAST  Technique:  Contiguous axial images were obtained from the base of the skull through the vertex without contrast.  Comparison: 05/02/2008.  Findings: Stable paranasal sinuses and mastoids. No acute osseous abnormality identified.  No acute orbit or scalp soft tissue findings.  Calcified atherosclerosis at the skull base.  No suspicious intracranial vascular hyperdensity.  Interval generalized cerebral volume loss.  Mild to moderate ex vacuo enlargement of the ventricles.  Patchy periventricular white matter hypodensity also mildly progressed. No midline shift, mass effect, or evidence of mass lesion.  No acute intracranial hemorrhage identified.  No evidence of cortically based acute infarction identified.  IMPRESSION: No acute intracranial abnormality.  Further generalized cerebral volume loss and mildly increased nonspecific white matter changes since 2010.   Original Report Authenticated By: Erskine Speed, M.D.     MEDICATIONS: Scheduled Meds: . aspirin  81 mg Oral Daily  . benazepril  20 mg Oral Daily  . cefTRIAXone (ROCEPHIN)  IV  1 g Intravenous Q24H  . Chlorhexidine Gluconate Cloth  6 each Topical Q0600  . enoxaparin (LOVENOX) injection  40 mg Subcutaneous Q24H  . lactose free nutrition  237 mL Oral BID  . levothyroxine  75 mcg Oral QAC breakfast  . metoprolol succinate  50 mg Oral Daily  .  mupirocin ointment  1 application Nasal BID   Continuous Infusions: . sodium chloride 40 mL/hr at 04/09/12 0230   PRN Meds:.acetaminophen, acetaminophen, alum & mag hydroxide-simeth, HYDROmorphone (DILAUDID) injection, ondansetron (ZOFRAN) IV, ondansetron, ondansetron, oxyCODONE, zolpidem  Antibiotics: Anti-infectives   Start     Dose/Rate Route Frequency Ordered Stop   04/07/12 2030  vancomycin (VANCOCIN) IVPB 1000 mg/200 mL premix  Status:  Discontinued     1,000 mg 200 mL/hr over 60 Minutes Intravenous  Once 04/07/12 2023 04/07/12 2115  04/07/12 2030  cefTRIAXone (ROCEPHIN) 1 g in dextrose 5 % 50 mL IVPB     1 g 100 mL/hr over 30 Minutes Intravenous  Once 04/07/12 2023 04/07/12 2148   04/07/12 1000  cefTRIAXone (ROCEPHIN) 1 g in dextrose 5 % 50 mL IVPB     1 g 100 mL/hr over 30 Minutes Intravenous Every 24 hours 04/07/12 2237         Jeoffrey Massed, MD  [facility] Pager:[phone]  If 7PM-7AM, please contact night-coverage [email or web address] Password [email or web address] 04/09/2012, 10:25 AM   LOS: 2 days

## 2012-04-10 LAB — URINE CULTURE: Colony Count: >=100000

## 2012-04-10 MED ORDER — METOPROLOL TARTRATE 1 MG/ML IV SOLN
INTRAVENOUS | Status: AC
  Administered 2012-04-10: 5 mg via INTRAVENOUS
  Filled 2012-04-10: qty 5

## 2012-04-10 MED ORDER — BENAZEPRIL HCL 20 MG PO TABS: 40.0000 mg | ORAL_TABLET | Freq: Every day | ORAL | Status: DC

## 2012-04-10 MED ORDER — METOPROLOL TARTRATE 1 MG/ML IV SOLN: 5.0000 mg | Freq: Once | INTRAVENOUS | Status: AC

## 2012-04-10 MED ORDER — METOPROLOL SUCCINATE ER 100 MG PO TB24
100.0000 mg | ORAL_TABLET | Freq: Every day | ORAL | Status: DC
  Administered 2012-04-10: 100 mg via ORAL
  Filled 2012-04-10: qty 1

## 2012-04-10 MED ORDER — METOPROLOL SUCCINATE ER 50 MG PO TB24: 100.0000 mg | ORAL_TABLET | Freq: Every day | ORAL | Status: DC

## 2012-04-10 MED ORDER — CEPHALEXIN 500 MG PO CAPS: 500.0000 mg | ORAL_CAPSULE | Freq: Three times a day (TID) | ORAL | Status: DC

## 2012-04-10 MED ORDER — CIPROFLOXACIN HCL 500 MG PO TABS: 500.0000 mg | ORAL_TABLET | Freq: Two times a day (BID) | ORAL | Status: DC

## 2012-04-10 NOTE — Clinical Social Work Note (Signed)
CSW was consulted to complete discharge of patient. Pt to transfer to [facility] today via [facility]. Facility and family are aware of d/c. D/C packet complete with chart copy, signed FL2, and signed hard Rx.  CSW signing off as no other CSW needs identified at this time.  Lia Foyer, LCSWA [facility] Clinical Social Worker Contact #: [phone]

## 2012-04-10 NOTE — Progress Notes (Signed)
Pt had episode of extreme tachycardia on telemetry. VS taken Blood pressure 152/91, pulse 144, temperature 99.1 F (37.3 C), temperature source Oral, resp. rate 20, SpO2 96.00% RA. Pt free from complaints and pleasantly confused at baseline. [facility] Coyle Stordahl on call R. Riedler notified and has entered orders for lopressor 5mg  IV x1. Med given, VS now Blood pressure 148/86, pulse 84, temperature 98.8 F (37.1 C), temperature source Oral, resp. rate 18, SpO2 95.00%.RA. Pt in bed resting, Will continue to monitor and assist as needed. Julien Nordmann [facility]

## 2012-04-10 NOTE — Discharge Summary (Addendum)
PATIENT DETAILS Name: Kaitlyn Lucero Age: 79 y.o. Sex: female Date of Birth: Jun 15, 1921 MRN: [id number]. Admit Date: 04/07/2012 Admitting Physician: Ron Parker, MD WUX:[id number] Roseanne Reno, MD  Recommendations for Outpatient Follow-up:  1. Check CBC and BMET periodically 2. Optimize antihypertensive therapy  PRIMARY DISCHARGE DIAGNOSIS:  Active Problems:   Acute encephalopathy   UTI (lower urinary tract infection)   Fever presenting with conditions classified elsewhere      PAST MEDICAL HISTORY: Past Medical History  Diagnosis Date  . Atrial fibrillation   . Unspecified essential hypertension   . Alzheimer disease   . UTI (urinary tract infection)     DISCHARGE MEDICATIONS:   Medication List    TAKE these medications       acetaminophen 325 MG tablet  Commonly known as:  TYLENOL  Take 650 mg by mouth every 4 (four) hours as needed (headache).     aspirin 81 MG chewable tablet  Chew 81 mg by mouth daily.     benazepril 20 MG tablet  Commonly known as:  LOTENSIN  Take 2 tablets (40 mg total) by mouth daily.     bisacodyl 10 MG suppository  Commonly known as:  DULCOLAX  Place 10 mg rectally daily as needed for constipation.     cephALEXin 500 MG capsule  Commonly known as:  KEFLEX  Take 1 capsule (500 mg total) by mouth 3 (three) times daily. For 5 days from 04/10/12     feeding supplement Liqd  Take 237 mLs by mouth 2 (two) times daily. Vanilla flavor     guaiFENesin-dextromethorphan 100-10 MG/5ML syrup  Commonly known as:  ROBITUSSIN DM  Take 10 mLs by mouth 3 (three) times daily as needed for cough.     levothyroxine 75 MCG tablet  Commonly known as:  SYNTHROID, LEVOTHROID  Take 75 mcg by mouth daily.     metoprolol succinate 50 MG 24 hr tablet  Commonly known as:  TOPROL-XL  Take 2 tablets (100 mg total) by mouth daily.     ondansetron 4 MG tablet  Commonly known as:  ZOFRAN  Take 4 mg by mouth every 8 (eight) hours as needed for nausea.          BRIEF HPI:  See H&P, Labs, Consult and Test reports for all details in brief, patient Kaitlyn Lucero is a 79 y.o. female resident from [facility] who was sent to the ED due to fevers and increased confusion.  CONSULTATIONS:   None  PERTINENT RADIOLOGIC STUDIES: Ct Head Wo Contrast  04/07/2012  *RADIOLOGY REPORT*  Clinical Data: 79 year old female generalized weakness and stopped and the awaiting.  CT HEAD WITHOUT CONTRAST  Technique:  Contiguous axial images were obtained from the base of the skull through the vertex without contrast.  Comparison: 05/02/2008.  Findings: Stable paranasal sinuses and mastoids. No acute osseous abnormality identified.  No acute orbit or scalp soft tissue findings.  Calcified atherosclerosis at the skull base.  No suspicious intracranial vascular hyperdensity.  Interval generalized cerebral volume loss.  Mild to moderate ex vacuo enlargement of the ventricles.  Patchy periventricular white matter hypodensity also mildly progressed. No midline shift, mass effect, or evidence of mass lesion.  No acute intracranial hemorrhage identified.  No evidence of cortically based acute infarction identified.  IMPRESSION: No acute intracranial abnormality.  Further generalized cerebral volume loss and mildly increased nonspecific white matter changes since 2010.   Original Report Authenticated By: Erskine Speed, M.D.    Dg Chest Port 1  View  04/08/2012  *RADIOLOGY REPORT*  Clinical Data: Shortness of breath  PORTABLE CHEST - 1 VIEW  Comparison:  10/30/2010; 05/04/2009; 05/02/2008  Findings:  Grossly unchanged cardiac silhouette and mediastinal contours given slightly reduced lung volumes.  Atherosclerotic calcifications within the aortic arch. Unchanged calcified granuloma overlying the right lower lung.  Worsening bibasilar opacities favored to represent atelectasis.  Possible trace bilateral effusions.  No pneumothorax.  No definite pulmonary edema.  Unchanged  bones.  IMPRESSION: Slightly decreased lung volumes with worsening bibasilar opacities favored to represent atelectasis.  No definite acute cardiopulmonary disease on this AP portable examination.  Further evaluation with a PA and lateral chest radiograph may be obtained as clinically indicated.   Original Report Authenticated By: Tacey Ruiz, MD      PERTINENT LAB RESULTS: CBC:  Recent Labs  04/08/12 0500 04/09/12 0800  WBC 6.6 4.9  HGB 12.0 12.3  HCT 35.6* 36.1  PLT 167 144*   CMET CMP     Component Value Date/Time   NA 136 04/09/2012 0800   K 3.2* 04/09/2012 0800   CL 104 04/09/2012 0800   CO2 22 04/09/2012 0800   GLUCOSE 101* 04/09/2012 0800   BUN 11 04/09/2012 0800   CREATININE 0.74 04/09/2012 0800   CALCIUM 8.4 04/09/2012 0800   PROT 7.7 04/07/2012 1758   ALBUMIN 4.0 04/07/2012 1758   AST 19 04/07/2012 1758   ALT 14 04/07/2012 1758   ALKPHOS 89 04/07/2012 1758   BILITOT 0.3 04/07/2012 1758   GFRNONAA 74* 04/09/2012 0800   GFRAA 85* 04/09/2012 0800    GFR Estimated Creatinine Clearance: 49 ml/min (by C-G formula based on Cr of 0.74).  Recent Labs  04/07/12 1758  LIPASE 32    Recent Labs  04/07/12 1759  TROPONINI <0.30   No components found with this basename: POCBNP,  No results found for this basename: DDIMER,  in the last 72 hours No results found for this basename: HGBA1C,  in the last 72 hours No results found for this basename: CHOL, HDL, LDLCALC, TRIG, CHOLHDL, LDLDIRECT,  in the last 72 hours No results found for this basename: TSH, T4TOTAL, FREET3, T3FREE, THYROIDAB,  in the last 72 hours No results found for this basename: VITAMINB12, FOLATE, FERRITIN, TIBC, IRON, RETICCTPCT,  in the last 72 hours Coags:  Recent Labs  04/07/12 1758  INR 0.99   Microbiology: Recent Results (from the past 240 hour(s))  URINE CULTURE     Status: None   Collection Time    04/07/12  7:39 PM      Result Value Range Status   Specimen Description URINE, CATHETERIZED   Final    Special Requests NONE   Final   Culture  Setup Time 04/07/2012 21:40   Final   Colony Count >=100,000 COLONIES/ML   Final   Culture ESCHERICHIA COLI   Final   Report Status PENDING   Incomplete   Organism ID, Bacteria ESCHERICHIA COLI   Final  CULTURE, BLOOD (ROUTINE X 2)     Status: None   Collection Time    04/07/12  8:57 PM      Result Value Range Status   Specimen Description BLOOD RIGHT ARM   Final   Special Requests BOTTLES DRAWN AEROBIC ONLY 10CC   Final   Culture  Setup Time 04/08/2012 00:48   Final   Culture     Final   Value:        BLOOD CULTURE RECEIVED NO GROWTH TO DATE CULTURE  WILL BE HELD FOR 5 DAYS BEFORE ISSUING A FINAL NEGATIVE REPORT   Report Status PENDING   Incomplete  CULTURE, BLOOD (ROUTINE X 2)     Status: None   Collection Time    04/07/12  9:02 PM      Result Value Range Status   Specimen Description BLOOD RIGHT HAND   Final   Special Requests BOTTLES DRAWN AEROBIC ONLY 3CC   Final   Culture  Setup Time 04/08/2012 00:48   Final   Culture     Final   Value:        BLOOD CULTURE RECEIVED NO GROWTH TO DATE CULTURE WILL BE HELD FOR 5 DAYS BEFORE ISSUING A FINAL NEGATIVE REPORT   Report Status PENDING   Incomplete  MRSA PCR SCREENING     Status: Abnormal   Collection Time    04/07/12 10:25 PM      Result Value Range Status   MRSA by PCR POSITIVE (*) NEGATIVE Final   Comment:            The GeneXpert MRSA Assay (FDA     approved for NASAL specimens     only), is one component of a     comprehensive MRSA colonization     surveillance program. It is not     intended to diagnose MRSA     infection nor to guide or     monitor treatment for     MRSA infections.     RESULT CALLED TO, READ BACK BY AND VERIFIED WITH:     JOHNSON,S RN [id number] AT 0051 [facility]     BRIEF HOSPITAL COURSE:   Active Problems:   Acute encephalopathy -2/2 to UTI  - Seems to have resolved and the patient is back to her usual baseline.  -CT Head on admission-neg for acute  abnormalities   UTI (lower urinary tract infection) - Was started on empiric Rocephin during the hospital stay. Urine cultures are positive for Escherichia coli, sensitivities pending, she will be transitioned to ciprofloxacin on discharge. - He is now afebrile and without any leukocytosis. - Encephalopathy has resolved.  H/o Paroxysmal Afib - She has a history of chronic paroxysmal atrial fibrillation and goes in and out of A. Fib - She had a brief run of atrial fibrillation with rapid ventricular response early this morning- her rate is now much better controlled. - We have increased her metoprolol to 100 mg  HTN  -moderate control - Metoprolol hasn't been increased to 100 mg, benazepril has been increased 40 mg - Further optimization can be done in the outpatient setting  Hypothyroidism  -c/w Levothyroxine  TODAY-DAY OF DISCHARGE:  Subjective:   Kaitlyn Lucero today remains much more awake and alert compared to admission. Her mental status is now back to her usual baseline.  Objective:   Blood pressure 148/78, pulse 95, temperature 98.4 F (36.9 C), temperature source Oral, resp. rate 20, height 5\' 8"  (1.727 m), weight 72.8 kg (160 lb 7.9 oz), SpO2 96.00%.  Intake/Output Summary (Last 24 hours) at 04/10/12 1338 Last data filed at 04/10/12 0900  Gross per 24 hour  Intake    348 ml  Output    200 ml  Net    148 ml    Exam Awake Alert, and pleasantly confused, No new F.N deficits, Normal affect [place].AT,PERRAL Supple Neck,No JVD, No cervical lymphadenopathy appriciated.  Symmetrical Chest wall movement, Good air movement bilaterally, CTAB RRR,No Gallops,Rubs or new Murmurs, No Parasternal Heave +ve B.Sounds,  Abd Soft, Non tender, No organomegaly appriciated, No rebound -guarding or rigidity. No Cyanosis, Clubbing or edema, No new Rash or bruise  DISCHARGE CONDITION: Stable  DISPOSITION: SNF   DISCHARGE INSTRUCTIONS:    Activity:  As tolerated with Full fall  precautions use walker/cane & assistance as needed  Diet recommendation: Heart Healthy diet   Follow-up Information   Follow up with [name], MD. Schedule an appointment as soon as possible for a visit in 1 week.   Contact information:   [address] [place] [address] [phone]       Total Time spent on discharge equals 45 minutes.  SignedJeoffrey Massed 04/10/2012 1:38 PM

## 2012-04-10 NOTE — Progress Notes (Signed)
1450 Patient discharge to [facility] per order transported via ambulance with 2 attendants.Skin with scattered bruises arms and legs. [name]  Patient  daughter is at bedside.Report called to nurse  Crystal,LPN at [facility]

## 2012-04-10 NOTE — Evaluation (Signed)
Physical Therapy Evaluation Patient Details Name: Kaitlyn Lucero MRN: [id number] DOB: 27-Sep-1921 Today's Date: 04/10/2012 Time: 5784-6962 PT Time Calculation (min): 1438 min  PT Assessment / Plan / Recommendation Clinical Impression  Pt adm with UTI, AMS from ALF.  Pt confused at baseline.  Feel pt is close to baseline with mobility and recommend return to ALF.  Would have HHPT see to make sure pt returns to her baseline.    PT Assessment  Patient needs continued PT services    Follow Up Recommendations  Home health PT    Does the patient have the potential to tolerate intense rehabilitation      Barriers to Discharge        Equipment Recommendations  None recommended by PT    Recommendations for Other Services     Frequency Min 3X/week    Precautions / Restrictions Precautions Precautions: Fall   Pertinent Vitals/Pain HR 70's      Mobility  Bed Mobility Bed Mobility: Supine to Sit;Sitting - Scoot to Edge of Bed;Sit to Supine Supine to Sit: 4: Min assist Sitting - Scoot to Edge of Bed: 5: Supervision Sit to Supine: 5: Supervision Details for Bed Mobility Assistance: Assist to initiate task. Transfers Transfers: Sit to Stand;Stand to Sit;Stand Pivot Transfers Sit to Stand: 5: Supervision;From bed;From chair/3-in-1;With armrests Stand to Sit: 5: Supervision;To bed;To chair/3-in-1;With armrests Stand Pivot Transfers: 5: Supervision Ambulation/Gait Ambulation/Gait Assistance: 4: Min assist Ambulation Distance (Feet): 150 Feet Assistive device: None;1 person hand held assist Ambulation/Gait Assistance Details: Slightly unsteady. Gait Pattern: Step-through pattern;Decreased stride length    Exercises     PT Diagnosis: Difficulty walking  PT Problem List: Decreased balance;Decreased mobility PT Treatment Interventions: Gait training;Functional mobility training;Therapeutic activities;Balance training;Patient/family education   PT Goals Acute Rehab PT Goals PT  Goal Formulation: Patient unable to participate in goal setting Time For Goal Achievement: 04/17/12 Potential to Achieve Goals: Good Pt will Ambulate: 51 - 150 feet;with supervision PT Goal: Ambulate - Progress: Goal set today  Visit Information  Last PT Received On: 04/10/12 Assistance Needed: +1    Subjective Data  Subjective: "That's a cute outfit," pt stated. I was wearing blue isolation gown. Patient Stated Goal: Unable to state.   Prior Functioning  Home Living Available Help at Discharge: Other (Comment) (ALF) Type of Home: Assisted living Home Access: Level entry Home Layout: One level Prior Function Level of Independence: Independent (with gait)    Cognition  Cognition Overall Cognitive Status: History of cognitive impairments - at baseline Arousal/Alertness: Awake/alert Orientation Level: Disoriented to;Place;Time;Situation Cognition - Other Comments: Needs cues to stay on task.    Extremity/Trunk Assessment Right Lower Extremity Assessment RLE ROM/Strength/Tone: WFL for tasks assessed Left Lower Extremity Assessment LLE ROM/Strength/Tone: WFL for tasks assessed   Balance Balance Balance Assessed: Yes Static Standing Balance Static Standing - Balance Support: No upper extremity supported Static Standing - Level of Assistance: 5: Stand by assistance  End of Session PT - End of Session Equipment Utilized During Treatment: Gait belt Activity Tolerance: Patient tolerated treatment well Patient left: in bed;with call bell/phone within reach;with bed alarm set Nurse Communication: Mobility status  GP     Katti Pelle 04/10/2012, 1:36 PM  [employer] PT [phone]

## 2012-04-10 NOTE — Care Management Note (Signed)
    Page 1 of 1   04/10/2012     4:10:06 PM   CARE MANAGEMENT NOTE 04/10/2012  Patient:  COURTNAY, PETRILLA   Account Number:  [id number]  Date Initiated:  04/10/2012  Documentation initiated by:  Letha Cape  Subjective/Objective Assessment:   dx acute encephalopathy  admit- from [facility]- sn[facility]     Action/Plan:   Anticipated DC Date:  04/10/2012   Anticipated DC Plan:  SKILLED NURSING FACILITY  In-house referral  Clinical Social Worker      DC Planning Services  CM consult      Choice offered to / List presented to:             Status of service:  Completed, signed off Medicare Important Message given?   (If response is "NO", the following Medicare IM given date fields will be blank) Date Medicare IM given:   Date Additional Medicare IM given:    Discharge Disposition:  SKILLED NURSING FACILITY  Per UR Regulation:  Reviewed for med. necessity/level of care/duration of stay  If discussed at Long Length of Stay Meetings, dates discussed:    Comments:  04/10/12 16:09 Letha Cape RN, BSN [phone] patient from [facility], dc today, CSW following.

## 2012-04-11 NOTE — Clinical Social Work Note (Signed)
Clinical Social Work Department BRIEF PSYCHOSOCIAL ASSESSMENT 04/11/2012  Patient:  Kaitlyn Lucero, Kaitlyn Lucero     Account Number:  [id number]     Admit date:  04/07/2012  Clinical Social Worker:  Johnsie Cancel  Date/Time:  04/10/2012 10:23 AM  Referred by:  Physician  Date Referred:  04/07/2012 Referred for  ALF Placement   Other Referral:   Interview type:  Family Other interview type:    PSYCHOSOCIAL DATA Living Status:  FACILITY Admitted from facility:  [facility] Level of care:  Assisted Living Primary support name:  [name] [phone]) Primary support relationship to patient:  CHILD, ADULT Degree of support available:   Adequate, at patient's bedside.    CURRENT CONCERNS Current Concerns  Post-Acute Placement   Other Concerns:    SOCIAL WORK ASSESSMENT / PLAN CSW consulted re: patient being from [facility]. CSW completed chart review, and due to patient's AMS, contacted the patient's daughter. CSW spoke to [place], at patient's bedside. CSW introduced self and explained role of CSW. Patient's daughter stated she wanted the patient to d/c to [facility] when medically stable. Patient will be transferred via [facility] to facility. CSW confirmed with [facility] she may return at d/c.   Assessment/plan status:  Information/Referral to [facility] Other assessment/ plan:   Information/referral to community resources:   DNR Form    PATIENT'S/FAMILY'S RESPONSE TO PLAN OF CARE: Patient's daughter thanked CSW for assisting in d/c plan. Daughter appeared relieved and thankful CSW assisted in DNR completion for patient transport.    Lia Foyer, LCSWA [facility] Clinical Social Worker Contact #: [phone]

## 2012-04-14 LAB — CULTURE, BLOOD (ROUTINE X 2): Culture: NO GROWTH

## 2012-06-08 DIAGNOSIS — M009 Pyogenic arthritis, unspecified: Secondary | ICD-10-CM

## 2012-06-08 HISTORY — DX: Pyogenic arthritis, unspecified: M00.9

## 2012-06-20 ENCOUNTER — Encounter (HOSPITAL_COMMUNITY): Payer: Self-pay

## 2012-06-20 ENCOUNTER — Emergency Department (HOSPITAL_COMMUNITY): Payer: Medicare Other

## 2012-06-20 ENCOUNTER — Inpatient Hospital Stay (HOSPITAL_COMMUNITY)
Admission: EM | Admit: 2012-06-20 | Discharge: 2012-06-25 | DRG: 550 | Disposition: A | Discharge status: Skilled Nursing Facility | Payer: Medicare Other | Attending: Family Medicine | Admitting: Family Medicine

## 2012-06-20 DIAGNOSIS — N390 Urinary tract infection, site not specified: Secondary | ICD-10-CM

## 2012-06-20 DIAGNOSIS — G9340 Encephalopathy, unspecified: Secondary | ICD-10-CM

## 2012-06-20 DIAGNOSIS — R5081 Fever presenting with conditions classified elsewhere: Secondary | ICD-10-CM

## 2012-06-20 DIAGNOSIS — M25432 Effusion, left wrist: Secondary | ICD-10-CM

## 2012-06-20 DIAGNOSIS — D72829 Elevated white blood cell count, unspecified: Secondary | ICD-10-CM | POA: Diagnosis present

## 2012-06-20 DIAGNOSIS — I10 Essential (primary) hypertension: Secondary | ICD-10-CM | POA: Diagnosis present

## 2012-06-20 DIAGNOSIS — G309 Alzheimer's disease, unspecified: Secondary | ICD-10-CM | POA: Diagnosis present

## 2012-06-20 DIAGNOSIS — I4891 Unspecified atrial fibrillation: Secondary | ICD-10-CM | POA: Diagnosis present

## 2012-06-20 DIAGNOSIS — M009 Pyogenic arthritis, unspecified: Principal | ICD-10-CM | POA: Diagnosis present

## 2012-06-20 DIAGNOSIS — F0280 Dementia in other diseases classified elsewhere without behavioral disturbance: Secondary | ICD-10-CM | POA: Diagnosis present

## 2012-06-20 MED ORDER — FENTANYL CITRATE 0.05 MG/ML IJ SOLN
25.0000 ug | INTRAMUSCULAR | Status: DC | PRN
  Administered 2012-06-21 – 2012-06-24 (×7): 25 ug via INTRAVENOUS
  Filled 2012-06-20 (×7): qty 2

## 2012-06-20 MED ORDER — VANCOMYCIN HCL IN DEXTROSE 1-5 GM/200ML-% IV SOLN
1000.0000 mg | Freq: Once | INTRAVENOUS | Status: AC
  Administered 2012-06-21: 1000 mg via INTRAVENOUS
  Filled 2012-06-20: qty 200

## 2012-06-20 MED ORDER — ONDANSETRON HCL 4 MG/2ML IJ SOLN
4.0000 mg | Freq: Once | INTRAMUSCULAR | Status: AC
  Administered 2012-06-21: 4 mg via INTRAVENOUS
  Filled 2012-06-20: qty 2

## 2012-06-20 NOTE — ED Notes (Signed)
Patient is alert with pain 10 of 10 in the left wrist.  It is unable to assess if she had any trauma to the wrist.  There is notable redness and warmth to the left wrist.

## 2012-06-20 NOTE — ED Notes (Signed)
Patient transported to X-ray 

## 2012-06-20 NOTE — ED Notes (Signed)
Pt from SNF and staff noticed her left hand to be red and swollen this evening, no report of a fall or known injury

## 2012-06-21 ENCOUNTER — Encounter (HOSPITAL_COMMUNITY): Payer: Self-pay

## 2012-06-21 ENCOUNTER — Encounter (HOSPITAL_COMMUNITY): Payer: Self-pay | Admitting: Anesthesiology

## 2012-06-21 ENCOUNTER — Inpatient Hospital Stay (HOSPITAL_COMMUNITY): Payer: Medicare Other | Admitting: Anesthesiology

## 2012-06-21 ENCOUNTER — Inpatient Hospital Stay (HOSPITAL_COMMUNITY): Payer: Medicare Other

## 2012-06-21 ENCOUNTER — Encounter (HOSPITAL_COMMUNITY)
Admission: EM | Disposition: A | Discharge status: Skilled Nursing Facility | Payer: Self-pay | Source: Home / Self Care | Attending: Family Medicine

## 2012-06-21 DIAGNOSIS — I10 Essential (primary) hypertension: Secondary | ICD-10-CM

## 2012-06-21 DIAGNOSIS — M25439 Effusion, unspecified wrist: Secondary | ICD-10-CM

## 2012-06-21 DIAGNOSIS — G309 Alzheimer's disease, unspecified: Secondary | ICD-10-CM

## 2012-06-21 DIAGNOSIS — M009 Pyogenic arthritis, unspecified: Secondary | ICD-10-CM | POA: Diagnosis present

## 2012-06-21 HISTORY — PX: IRRIGATION AND DEBRIDEMENT ABSCESS: SHX5252

## 2012-06-21 LAB — URINALYSIS, ROUTINE W REFLEX MICROSCOPIC
Glucose, UA: NEGATIVE mg/dL
Ketones, ur: NEGATIVE mg/dL
Nitrite: NEGATIVE
Protein, ur: NEGATIVE mg/dL
pH: 7.5 (ref 5.0–8.0)

## 2012-06-21 LAB — SYNOVIAL CELL COUNT + DIFF, W/ CRYSTALS
Monocyte-Macrophage-Synovial Fluid: 1 % — ABNORMAL LOW (ref 50–90)
Neutrophil, Synovial: 84 % — ABNORMAL HIGH (ref 0–25)
WBC, Synovial: 6180 /mm3 — ABNORMAL HIGH (ref 0–200)

## 2012-06-21 LAB — CBC WITH DIFFERENTIAL/PLATELET
Basophils Relative: 0 % (ref 0–1)
Hemoglobin: 12.1 g/dL (ref 12.0–15.0)
MCHC: 34.1 g/dL (ref 30.0–36.0)
Monocytes Relative: 9 % (ref 3–12)
Neutro Abs: 8.3 10*3/uL — ABNORMAL HIGH (ref 1.7–7.7)
Neutrophils Relative %: 66 % (ref 43–77)
RBC: 4 MIL/uL (ref 3.87–5.11)

## 2012-06-21 LAB — BASIC METABOLIC PANEL
BUN: 24 mg/dL — ABNORMAL HIGH (ref 6–23)
Chloride: 101 mEq/L (ref 96–112)
GFR calc Af Amer: 67 mL/min — ABNORMAL LOW (ref >90)
Potassium: 4.1 mEq/L (ref 3.5–5.1)
Sodium: 134 mEq/L — ABNORMAL LOW (ref 135–145)

## 2012-06-21 LAB — URINE MICROSCOPIC-ADD ON

## 2012-06-21 LAB — MRSA PCR SCREENING: MRSA by PCR: NEGATIVE

## 2012-06-21 LAB — SEDIMENTATION RATE: Sed Rate: 12 mm/hr (ref 0–22)

## 2012-06-21 LAB — URIC ACID: Uric Acid, Serum: 5.5 mg/dL (ref 2.4–7.0)

## 2012-06-21 LAB — GLUCOSE, CAPILLARY: Glucose-Capillary: 126 mg/dL — ABNORMAL HIGH (ref 70–99)

## 2012-06-21 SURGERY — MINOR INCISION AND DRAINAGE OF ABSCESS
Anesthesia: General | Site: Wrist | Laterality: Left | Wound class: Dirty or Infected

## 2012-06-21 MED ORDER — LACTATED RINGERS IV SOLN: INTRAVENOUS | Status: DC

## 2012-06-21 MED ORDER — ACETAMINOPHEN 10 MG/ML IV SOLN
INTRAVENOUS | Status: DC | PRN
  Administered 2012-06-21: 1000 mg via INTRAVENOUS

## 2012-06-21 MED ORDER — LEVOTHYROXINE SODIUM 75 MCG PO TABS
75.0000 ug | ORAL_TABLET | Freq: Every day | ORAL | Status: DC
  Administered 2012-06-21 – 2012-06-25 (×5): 75 ug via ORAL
  Filled 2012-06-21 (×7): qty 1

## 2012-06-21 MED ORDER — LIDOCAINE HCL (CARDIAC) 10 MG/ML IV SOLN: INTRAVENOUS | Status: DC | PRN

## 2012-06-21 MED ORDER — KCL IN DEXTROSE-NACL 20-5-0.45 MEQ/L-%-% IV SOLN
INTRAVENOUS | Status: DC
  Administered 2012-06-21 – 2012-06-22 (×2): via INTRAVENOUS
  Filled 2012-06-21 (×4): qty 1000

## 2012-06-21 MED ORDER — ASPIRIN 81 MG PO CHEW
81.0000 mg | CHEWABLE_TABLET | Freq: Every day | ORAL | Status: DC
  Administered 2012-06-21 – 2012-06-25 (×5): 81 mg via ORAL
  Filled 2012-06-21 (×5): qty 1

## 2012-06-21 MED ORDER — BISACODYL 10 MG RE SUPP: 10.0000 mg | Freq: Every day | RECTAL | Status: DC | PRN

## 2012-06-21 MED ORDER — BUPIVACAINE HCL (PF) 0.5 % IJ SOLN
INTRAMUSCULAR | Status: DC | PRN
  Administered 2012-06-21: 10 mL

## 2012-06-21 MED ORDER — PHENYLEPHRINE HCL 10 MG/ML IJ SOLN
INTRAMUSCULAR | Status: DC | PRN
  Administered 2012-06-21 (×3): 80 ug via INTRAVENOUS

## 2012-06-21 MED ORDER — ACETAMINOPHEN 325 MG PO TABS
650.0000 mg | ORAL_TABLET | ORAL | Status: DC | PRN
  Administered 2012-06-22: 650 mg via ORAL
  Filled 2012-06-21 (×2): qty 2

## 2012-06-21 MED ORDER — ONDANSETRON HCL 4 MG PO TABS: 4.0000 mg | ORAL_TABLET | Freq: Three times a day (TID) | ORAL | Status: DC | PRN

## 2012-06-21 MED ORDER — BENAZEPRIL HCL 40 MG PO TABS
40.0000 mg | ORAL_TABLET | Freq: Every day | ORAL | Status: DC
  Administered 2012-06-21 – 2012-06-25 (×5): 40 mg via ORAL
  Filled 2012-06-21 (×6): qty 1

## 2012-06-21 MED ORDER — VANCOMYCIN HCL IN DEXTROSE 750-5 MG/150ML-% IV SOLN
750.0000 mg | Freq: Two times a day (BID) | INTRAVENOUS | Status: DC
  Administered 2012-06-21 – 2012-06-24 (×6): 750 mg via INTRAVENOUS
  Filled 2012-06-21 (×8): qty 150

## 2012-06-21 MED ORDER — METOPROLOL SUCCINATE ER 100 MG PO TB24
100.0000 mg | ORAL_TABLET | Freq: Every day | ORAL | Status: DC
  Administered 2012-06-21 – 2012-06-25 (×5): 100 mg via ORAL
  Filled 2012-06-21 (×6): qty 1

## 2012-06-21 MED ORDER — FENTANYL CITRATE 0.05 MG/ML IJ SOLN: 25.0000 ug | INTRAMUSCULAR | Status: DC | PRN

## 2012-06-21 MED ORDER — FENTANYL CITRATE 0.05 MG/ML IJ SOLN
INTRAMUSCULAR | Status: DC | PRN
  Administered 2012-06-21 (×3): 50 ug via INTRAVENOUS

## 2012-06-21 MED ORDER — LIDOCAINE HCL 1 % IJ SOLN
INTRAMUSCULAR | Status: AC
  Administered 2012-06-21: 20 mL
  Filled 2012-06-21: qty 20

## 2012-06-21 MED ORDER — PIPERACILLIN-TAZOBACTAM 3.375 G IVPB 30 MIN
3.3750 g | Freq: Once | INTRAVENOUS | Status: AC
  Administered 2012-06-21: 3.375 g via INTRAVENOUS
  Filled 2012-06-21 (×2): qty 50

## 2012-06-21 MED ORDER — GUAIFENESIN-DM 100-10 MG/5ML PO SYRP
10.0000 mL | ORAL_SOLUTION | Freq: Three times a day (TID) | ORAL | Status: DC | PRN
  Administered 2012-06-22 – 2012-06-24 (×2): 10 mL via ORAL
  Filled 2012-06-21 (×2): qty 10

## 2012-06-21 MED ORDER — PROPOFOL 10 MG/ML IV BOLUS
INTRAVENOUS | Status: DC | PRN
  Administered 2012-06-21 (×3): 80 mg via INTRAVENOUS

## 2012-06-21 MED ORDER — LACTATED RINGERS IV SOLN
INTRAVENOUS | Status: DC | PRN
  Administered 2012-06-21: 21:00:00 via INTRAVENOUS

## 2012-06-21 MED ORDER — ONDANSETRON HCL 4 MG/2ML IJ SOLN
INTRAMUSCULAR | Status: DC | PRN
  Administered 2012-06-21: 4 mg via INTRAVENOUS

## 2012-06-21 MED ORDER — MIDAZOLAM HCL 5 MG/5ML IJ SOLN
INTRAMUSCULAR | Status: DC | PRN
  Administered 2012-06-21: 0.5 mg via INTRAVENOUS

## 2012-06-21 MED ORDER — ENSURE COMPLETE PO LIQD
237.0000 mL | Freq: Two times a day (BID) | ORAL | Status: DC
  Administered 2012-06-21 – 2012-06-24 (×7): 237 mL via ORAL
  Filled 2012-06-21: qty 237

## 2012-06-21 MED ORDER — PIPERACILLIN-TAZOBACTAM 3.375 G IVPB
3.3750 g | Freq: Three times a day (TID) | INTRAVENOUS | Status: DC
  Administered 2012-06-22 – 2012-06-24 (×7): 3.375 g via INTRAVENOUS
  Filled 2012-06-21 (×9): qty 50

## 2012-06-21 MED ORDER — ESMOLOL HCL 10 MG/ML IV SOLN
INTRAVENOUS | Status: DC | PRN
  Administered 2012-06-21: 30 mg via INTRAVENOUS

## 2012-06-21 MED ORDER — HEPARIN SODIUM (PORCINE) 5000 UNIT/ML IJ SOLN
5000.0000 [IU] | Freq: Three times a day (TID) | INTRAMUSCULAR | Status: DC
  Administered 2012-06-21 – 2012-06-25 (×11): 5000 [IU] via SUBCUTANEOUS
  Filled 2012-06-21 (×16): qty 1

## 2012-06-21 SURGICAL SUPPLY — 38 items
BAG SPEC THK2 15X12 ZIP CLS (MISCELLANEOUS) ×1
BAG ZIPLOCK 12X15 (MISCELLANEOUS) ×2 IMPLANT
BANDAGE ELASTIC 4 VELCRO ST LF (GAUZE/BANDAGES/DRESSINGS) ×1 IMPLANT
BANDAGE GAUZE ELAST BULKY 4 IN (GAUZE/BANDAGES/DRESSINGS) ×2 IMPLANT
CANISTER SUCTION 2500CC (MISCELLANEOUS) ×2 IMPLANT
CLOTH BEACON ORANGE TIMEOUT ST (SAFETY) ×2 IMPLANT
CORDS BIPOLAR (ELECTRODE) ×1 IMPLANT
CUFF TOURN SGL QUICK 18 (TOURNIQUET CUFF) ×2 IMPLANT
CUFF TOURN SGL QUICK 24 (TOURNIQUET CUFF)
CUFF TRNQT CYL 24X4X40X1 (TOURNIQUET CUFF) IMPLANT
DRAIN PENROSE 18X1/2 LTX STRL (DRAIN) IMPLANT
DRAPE SURG 17X11 SM STRL (DRAPES) ×4 IMPLANT
DRSG XEROFORM 1X8 (GAUZE/BANDAGES/DRESSINGS) ×1 IMPLANT
ELECT REM PT RETURN 9FT ADLT (ELECTROSURGICAL) ×2
ELECTRODE REM PT RTRN 9FT ADLT (ELECTROSURGICAL) ×1 IMPLANT
GAUZE XEROFORM 1X8 LF (GAUZE/BANDAGES/DRESSINGS) ×2 IMPLANT
GLOVE BIOGEL M STRL SZ7.5 (GLOVE) ×2 IMPLANT
GLOVE BIOGEL PI IND STRL 6.5 (GLOVE) ×1 IMPLANT
GLOVE BIOGEL PI IND STRL 7.0 (GLOVE) ×1 IMPLANT
GLOVE BIOGEL PI IND STRL 7.5 (GLOVE) ×1 IMPLANT
GLOVE BIOGEL PI INDICATOR 6.5 (GLOVE) ×1
GLOVE BIOGEL PI INDICATOR 7.0 (GLOVE) ×1
GLOVE BIOGEL PI INDICATOR 7.5 (GLOVE) ×1
HANDPIECE INTERPULSE COAX TIP (DISPOSABLE) ×2
KIT BASIN OR (CUSTOM PROCEDURE TRAY) ×2 IMPLANT
NEEDLE HYPO 22GX1.5 SAFETY (NEEDLE) ×2 IMPLANT
NS IRRIG 1000ML POUR BTL (IV SOLUTION) ×2 IMPLANT
PACK LOWER EXTREMITY WL (CUSTOM PROCEDURE TRAY) ×2 IMPLANT
PAD CAST 4YDX4 CTTN HI CHSV (CAST SUPPLIES) IMPLANT
PADDING CAST COTTON 4X4 STRL (CAST SUPPLIES) ×2
POSITIONER SURGICAL ARM (MISCELLANEOUS) ×2 IMPLANT
SET CYSTO W/LG BORE CLAMP LF (SET/KITS/TRAYS/PACK) ×1 IMPLANT
SET HNDPC FAN SPRY TIP SCT (DISPOSABLE) ×1 IMPLANT
SPLINT PLASTER EXTRA FAST 3X15 (CAST SUPPLIES) ×1
SPLINT PLASTER GYPS XFAST 3X15 (CAST SUPPLIES) IMPLANT
SWAB COLLECTION DEVICE MRSA (MISCELLANEOUS) IMPLANT
SYR CONTROL 10ML LL (SYRINGE) ×2 IMPLANT
TUBE ANAEROBIC SPECIMEN COL (MISCELLANEOUS) IMPLANT

## 2012-06-21 NOTE — Progress Notes (Signed)
Clinical Social Work Department BRIEF PSYCHOSOCIAL ASSESSMENT 06/21/2012  Patient:  Kaitlyn Lucero, Kaitlyn Lucero     Account Number:  [id number]     Admit date:  06/20/2012  Clinical Social Worker:  Jacelyn Grip  Date/Time:  06/21/2012 10:30 AM  Referred by:  Physician  Date Referred:  06/21/2012 Referred for  ALF Placement   Other Referral:   Interview type:  Family Other interview type:    PSYCHOSOCIAL DATA Living Status:  FACILITY Admitted from facility:  [facility] Level of care:  Assisted Living Primary support name:  [name] Chiusano/daughter Primary support relationship to patient:  CHILD, ADULT Degree of support available:   strong    CURRENT CONCERNS Current Concerns  Post-Acute Placement   Other Concerns:    SOCIAL WORK ASSESSMENT / PLAN CSW received referral that pt admitted from facility.    CSW met with pt and pt daughter at bedside. Pt has history of dementia and very pleasant. Pt daughter reports that pt is a resident at [facility] and pt daughter would like pt to return to ALF. Pt daughter reports that pt at baseline needs limited assist for ambulation, is cued during feeding, and needs asssist with bathing. CSW discussed that PT evaluation will be completed in hospital to ensure that pt does not need any additional assistance such as home health at the ALF.    CSW contacted [facility] who confirmed that pt is a resident at facility and could return at discharge as long as there is not a significant change in pt.    CSW to continue to follow and assist with pt disposition planning.   Assessment/plan status:  Psychosocial Support/Ongoing Assessment of Needs Other assessment/ plan:   discharge planning   Information/referral to community resources:   Referral back to [facility]    PATIENT'S/FAMILY'S RESPONSE TO PLAN OF CARE: Pt is oriented to person only. Pt daughter at bedside is supportive and actively involved in pt care and  would like pt to return to [facility] as pt has been a resident there for 2 years.       Jacklynn Lewis, MSW, LCSWA  Clinical Social Work [phone]

## 2012-06-21 NOTE — Consult Note (Signed)
Reason for Consult:inflamed L wrist Referring Physician: ER/Hospitalist  Kaitlyn Lucero is an 79 y.o. right handed female.  HPI: pt unable to provide history obtained from caregiver.  Pt presented to ER early this am with pain, swelling, erythema of L wrist, caregiver does not report any trauma and states wrist was not like this on Sunday.  Erythema is volar and dorsal.  Pt has been given antibiotics since admission with some improvement in erythema.  Past Medical History  Diagnosis Date  . Atrial fibrillation   . Unspecified essential hypertension   . Alzheimer disease   . UTI (urinary tract infection)     Past Surgical History  Procedure Laterality Date  . Bladder suspension  1953  . Cesarean section  1961  . Abdominal hysterectomy  1964  . Bladder surgery  1977  . Cataract extraction  2003    left eye    Family History  Problem Relation Age of Onset  . Other Mother     lung and heart problems  . Other Father     pace maker, heart problems  . Hypertension Brother   . Other Brother     CABG x3  . Hypertension Sister     Social History:  reports that she has never smoked. She has never used smokeless tobacco. She reports that she does not drink alcohol or use illicit drugs.  Allergies:  Allergies  Allergen Reactions  . Amoxicillin-Pot Clavulanate Other (See Comments)    Per nursing home [facility]  . Codeine Other (See Comments)    Per nursing home [facility]  . Lidocaine Other (See Comments)    Per nursing home [facility]  . Nitrofurantoin Other (See Comments)    Per nursing home [facility]  . Banana Flavor Rash    Medications: reviewed  Results for orders placed during the hospital encounter of 06/20/12 (from the past 48 hour(s))  CBC WITH DIFFERENTIAL     Status: Abnormal   Collection Time    06/20/12 11:50 PM      Result Value Range   WBC 12.5 (*) 4.0 - 10.5 K/uL   RBC 4.00  3.87 - 5.11 MIL/uL   Hemoglobin 12.1  12.0 - 15.0 g/dL   HCT 16.1 (*) 09.6 - 04.5 %   MCV 88.8  78.0 -  100.0 fL   MCH 30.3  26.0 - 34.0 pg   MCHC 34.1  30.0 - 36.0 g/dL   RDW 40.9  81.1 - 91.4 %   Platelets 212  150 - 400 K/uL   Neutrophils Relative % 66  43 - 77 %   Neutro Abs 8.3 (*) 1.7 - 7.7 K/uL   Lymphocytes Relative 19  12 - 46 %   Lymphs Abs 2.4  0.7 - 4.0 K/uL   Monocytes Relative 9  3 - 12 %   Monocytes Absolute 1.1 (*) 0.1 - 1.0 K/uL   Eosinophils Relative 5  0 - 5 %   Eosinophils Absolute 0.6  0.0 - 0.7 K/uL   Basophils Relative 0  0 - 1 %   Basophils Absolute 0.1  0.0 - 0.1 K/uL  BASIC METABOLIC PANEL     Status: Abnormal   Collection Time    06/20/12 11:50 PM      Result Value Range   Sodium 134 (*) 135 - 145 mEq/L   Potassium 4.1  3.5 - 5.1 mEq/L   Chloride 101  96 - 112 mEq/L   CO2 23  19 - 32 mEq/L  Glucose, Bld 117 (*) 70 - 99 mg/dL   BUN 24 (*) 6 - 23 mg/dL   Creatinine, Ser 1.61  0.50 - 1.10 mg/dL   Calcium 8.9  8.4 - 09.6 mg/dL   GFR calc non Af Amer 58 (*) >90 mL/min   GFR calc Af Amer 67 (*) >90 mL/min   Comment:            The eGFR has been calculated     using the CKD EPI equation.     This calculation has not been     validated in all clinical     situations.     eGFR's persistently     <90 mL/min signify     possible Chronic Kidney Disease.  SEDIMENTATION RATE     Status: None   Collection Time    06/20/12 11:50 PM      Result Value Range   Sed Rate 12  0 - 22 mm/hr  URIC ACID     Status: None   Collection Time    06/21/12  2:37 AM      Result Value Range   Uric Acid, Serum 5.5  2.4 - 7.0 mg/dL  MRSA PCR SCREENING     Status: None   Collection Time    06/21/12  7:05 AM      Result Value Range   MRSA by PCR NEGATIVE  NEGATIVE   Comment:            The GeneXpert MRSA Assay (FDA     approved for NASAL specimens     only), is one component of a     comprehensive MRSA colonization     surveillance program. It is not     intended to diagnose MRSA     infection nor to guide or     monitor treatment for     MRSA infections.   URINALYSIS, ROUTINE W REFLEX MICROSCOPIC     Status: Abnormal   Collection Time    06/21/12  8:58 AM      Result Value Range   Color, Urine YELLOW  YELLOW   APPearance CLEAR  CLEAR   Specific Gravity, Urine 1.012  1.005 - 1.030   pH 7.5  5.0 - 8.0   Glucose, UA NEGATIVE  NEGATIVE mg/dL   Hgb urine dipstick NEGATIVE  NEGATIVE   Bilirubin Urine NEGATIVE  NEGATIVE   Ketones, ur NEGATIVE  NEGATIVE mg/dL   Protein, ur NEGATIVE  NEGATIVE mg/dL   Urobilinogen, UA 0.2  0.0 - 1.0 mg/dL   Nitrite NEGATIVE  NEGATIVE   Leukocytes, UA MODERATE (*) NEGATIVE  URINE MICROSCOPIC-ADD ON     Status: Abnormal   Collection Time    06/21/12  8:58 AM      Result Value Range   Squamous Epithelial / LPF RARE  RARE   WBC, UA 7-10  <3 WBC/hpf   Comment: WITH CLUMPS   Bacteria, UA FEW (*) RARE    Dg Hand Complete Left  06/20/2012   *RADIOLOGY REPORT*  Clinical Data: Hand pain.  LEFT HAND - COMPLETE 3+ VIEW  Comparison: None.  Findings: Advanced degenerative changes within the left wrist. Degenerative changes in the IP joints.  Diffuse bone demineralization.  No acute fracture, subluxation or dislocation visualized.  Soft tissues are intact.  IMPRESSION: Advanced degenerative changes as above.  Bony mineralization.  No acute findings.   Original Report Authenticated By: Charlett Nose, M.D.    Pertinent items are noted in HPI. Temp:  [  98.1 F (36.7 C)-98.9 F (37.2 C)] 98.9 F (37.2 C) (05/14 0657) Pulse Rate:  [70-84] 70 (05/14 1009) Resp:  [18-22] 18 (05/14 0657) BP: (144-203)/(68-100) 165/68 mmHg (05/14 1009) SpO2:  [96 %-99 %] 96 % (05/14 0657) Weight:  [72.167 kg (159 lb 1.6 oz)] 72.167 kg (159 lb 1.6 oz) (05/14 0657) General appearance: no distress Resp: clear to auscultation bilaterally Cardio: regular rate and rhythm GI: soft, non-tender; bowel sounds normal; no masses,  no organomegaly Extremities: R wrist with evidence of arthritic changes, no erythema, L wrist  with arthritic radial  deviation deformity, swelling on mostly dorsal hand with erythema more over radial side of wrist no eidence of lacerations, puncture wounds, cap refill intact,  seems painful to movement of wrist   Assessment/Plan: Red, swollen L wrist without clear history of trauma, concern for infected wrist.   Plan: Thorough discussion with caregiver about inflammatory arthritis vs infected wrist, will attempt aspiration and send for culture, may require operative drainage.  Darik Massing CHRISTOPHER 06/21/2012, 12:11 PM

## 2012-06-21 NOTE — Progress Notes (Signed)
Patient seen earlier this AM by my associate.  Please refer to his H and P for further details regarding Assessment and plan.  Daughter Kaitlyn Lucero) mentioned patient had h/o chronic UTI.  As such ordered u/a given leukocytosis.  But suspect leukocytosis is due to current disease process related to left wrist.  Will reassess next am.  Kaitlyn Lucero

## 2012-06-21 NOTE — Op Note (Signed)
06/20/2012 - 06/21/2012  10:00 PM  PATIENT:  Kaitlyn Lucero  79 y.o. female  PRE-OPERATIVE DIAGNOSIS:  left wrist infection  POST-OPERATIVE DIAGNOSIS:  left wrist infection  PROCEDURE:  Procedure(s): Arthrocentesis, irrigation of wrist joint  SURGEON:  Surgeon(s): Johnette Abraham, MD  ANESTHESIA:   general  SPECIMEN:  No Specimen  FINDINGS:  Turbid colored fluid radiocarpal joint  DISPOSITION OF SPECIMEN:  {SPECIMEN DISPOSITION:[id number]  PATIENT DISPOSITION:  PACU - hemodynamically stable.

## 2012-06-21 NOTE — H&P (Signed)
[facility] History and Physical  Kaitlyn Lucero XBJ:[id number] DOB: [birth year] DOA: 06/20/2012  Referring physician: ED PCP: [name], MD   Chief Complaint: Hand pain  HPI: Kaitlyn Lucero is a 79 y.o. female who presents to the ED from her ECF with development of L wrist redness and swelling that is reported by the NH that was noticed tonight.  Per grand daughter she is sure the patient didn't have this when she last visited the patient on Sunday.  Patient is unfortunatly not able to provide much history secondary to severe dementia from alzheimers disease.  She does indicate that the wrist is tender and painful but not able to really tell us much more at this time.  Per granddaughter patient has no history of anything similar in the past, no known history of gout.  In ED WBC is 12.5k, Ortho-hand will see patient about draining this in the morning, hospitalist has been asked to admit.  Review of Systems: Cannot really be completed due to patients dementia.  Past Medical History  Diagnosis Date  . Atrial fibrillation   . Unspecified essential hypertension   . Alzheimer disease   . UTI (urinary tract infection)    Past Surgical History  Procedure Laterality Date  . Bladder suspension  1953  . Cesarean section  1961  . Abdominal hysterectomy  1964  . Bladder surgery  1977  . Cataract extraction  2003    left eye   Social History:  reports that she has never smoked. She does not have any smokeless tobacco history on file. She reports that she does not drink alcohol or use illicit drugs.   Allergies  Allergen Reactions  . Amoxicillin-Pot Clavulanate Other (See Comments)    Per nursing home [facility]  . Codeine Other (See Comments)    Per [facility]  . Lidocaine Other (See Comments)    Per [facility]  . Nitrofurantoin Other (See Comments)    Per [facility]    Family History  Problem Relation Age of Onset  . Other Mother     lung and heart  problems  . Other Father     pace maker, heart problems  . Hypertension Brother   . Other Brother     CABG x3  . Hypertension Sister     Prior to Admission medications   Medication Sig Start Date End Date Taking? Authorizing Provider  acetaminophen (TYLENOL) 325 MG tablet Take 650 mg by mouth every 4 (four) hours as needed (headache).   Yes Historical Provider, MD  aspirin 81 MG chewable tablet Chew 81 mg by mouth daily.   Yes Historical Provider, MD  benazepril (LOTENSIN) 20 MG tablet Take 2 tablets (40 mg total) by mouth daily. 04/10/12  Yes Shanker Levora Dredge, MD  feeding supplement (ENSURE IMMUNE HEALTH) LIQD Take 237 mLs by mouth 2 (two) times daily. Vanilla flavor    Yes Historical Provider, MD  guaiFENesin-dextromethorphan (ROBITUSSIN DM) 100-10 MG/5ML syrup Take 10 mLs by mouth 3 (three) times daily as needed for cough.   Yes Historical Provider, MD  levothyroxine (SYNTHROID, LEVOTHROID) 75 MCG tablet Take 75 mcg by mouth daily.    Yes Historical Provider, MD  metoprolol succinate (TOPROL-XL) 50 MG 24 hr tablet Take 2 tablets (100 mg total) by mouth daily. 04/10/12  Yes Shanker Levora Dredge, MD  bisacodyl (DULCOLAX) 10 MG suppository Place 10 mg rectally daily as needed for constipation.    Historical Provider, MD  ondansetron (ZOFRAN) 4 MG  tablet Take 4 mg by mouth every 8 (eight) hours as needed for nausea.    Historical Provider, MD   Physical Exam: Filed Vitals:   06/20/12 2304 06/21/12 0024 06/21/12 0350  BP: 203/85 168/88   Pulse: 72 70   Temp: 98.1 F (36.7 C)  98.9 F (37.2 C)  TempSrc: Oral  Rectal  Resp: 22 18   SpO2: 99% 98%     General:  NAD, resting comfortably in bed Eyes: PEERLA EOMI ENT: mucous membranes moist Neck: supple w/o JVD Cardiovascular: RRR w/o MRG Respiratory: CTA B Abdomen: soft, nt, nd, bs+ Skin: no rash nor lesion Musculoskeletal: LUE swelling and tenderness over dorsum of wrist and mild swelling to volar aspect as well, increased warmth with  erythema, distal cap refill and motor intact Psychiatric: demented tone and affect Neurologic: Awake, alert, oriented to self only  Labs on Admission:  Basic Metabolic Panel:  Recent Labs Lab 06/20/12 2350  NA 134*  K 4.1  CL 101  CO2 23  GLUCOSE 117*  BUN 24*  CREATININE 0.87  CALCIUM 8.9   Liver Function Tests: No results found for this basename: AST, ALT, ALKPHOS, BILITOT, PROT, ALBUMIN,  in the last 168 hours No results found for this basename: LIPASE, AMYLASE,  in the last 168 hours No results found for this basename: AMMONIA,  in the last 168 hours CBC:  Recent Labs Lab 06/20/12 2350  WBC 12.5*  NEUTROABS 8.3*  HGB 12.1  HCT 35.5*  MCV 88.8  PLT 212   Cardiac Enzymes: No results found for this basename: CKTOTAL, CKMB, CKMBINDEX, TROPONINI,  in the last 168 hours  BNP (last 3 results) No results found for this basename: PROBNP,  in the last 8760 hours CBG: No results found for this basename: GLUCAP,  in the last 168 hours  Radiological Exams on Admission: Dg Hand Complete Left  06/20/2012   *RADIOLOGY REPORT*  Clinical Data: Hand pain.  LEFT HAND - COMPLETE 3+ VIEW  Comparison: None.  Findings: Advanced degenerative changes within the left wrist. Degenerative changes in the IP joints.  Diffuse bone demineralization.  No acute fracture, subluxation or dislocation visualized.  Soft tissues are intact.  IMPRESSION: Advanced degenerative changes as above.  Bony mineralization.  No acute findings.   Original Report Authenticated By: Charlett Nose, M.D.    EKG: Independently reviewed.  Assessment/Plan Principal Problem:   Septic arthritis of wrist, left Active Problems:   ALZHEIMERS DISEASE   HYPERTENSION   1. Septic arthritis of left wrist - patient on vancomycin empirically, family expressed concern about attempting arthrocentesis in ER and patient wasn't really coperative with this so to be done by Dr. Izora Ribas in AM.  Need Gram stain, microscopic eval, and  culture of fluid to adjust ABx.  No history of gout and uric acid of 5.5 with normal renal function, unlikely that this is an initial presentation of new diagnosis of gout (although again, microscopic analysis of fluid would be diagnostic if it were). 2. Alzheimer's disease - chronic and stable, continue home meds 3. HTN - continue home meds.  Ortho hand consulted and Dr. Izora Ribas will see patient in AM.  Code Status: Full Code (must indicate code status--if unknown or must be presumed, indicate so) Family Communication: Spoke with granddaughter at bedside, Daughter is POA and will be here in AM (actually works a shift here at [employer] as an Psychologist, educational I believe) (indicate person spoken with, if applicable, with phone number if by telephone) Disposition Plan:  Admit to inpatient (indicate anticipated LOS)  Time spent: 70 min  Tenisha Fleece M. [facility] Pager [phone]  If 7PM-7AM, please contact night-coverage [email or web address] Password [facility] 06/21/2012, 5:51 AM

## 2012-06-21 NOTE — Transfer of Care (Signed)
Immediate Anesthesia Transfer of Care Note  Patient: Kaitlyn Lucero  Procedure(s) Performed: Procedure(s) with comments: irrigation and debridement left wrist (Left) - left wrist  Patient Location: PACU  Anesthesia Type:General  Level of Consciousness: awake, sedated, patient cooperative and responds to stimulation  Airway & Oxygen Therapy: Patient Spontanous Breathing and Patient connected to face mask oxygen  Post-op Assessment: Report given to PACU RN, Post -op Vital signs reviewed and stable and Patient moving all extremities X 4  Post vital signs: stable  Complications: No apparent anesthesia complications

## 2012-06-21 NOTE — ED Notes (Signed)
Kaitlyn Lucero  (daughter  In [place] @ [facility]) [phone]

## 2012-06-21 NOTE — Anesthesia Postprocedure Evaluation (Signed)
  Anesthesia Post-op Note  Patient: Kaitlyn Lucero  Procedure(s) Performed: Procedure(s) (LRB): irrigation and debridement left wrist (Left)  Patient Location: PACU  Anesthesia Type: General  Level of Consciousness: awake and alert   Airway and Oxygen Therapy: Patient Spontanous Breathing  Post-op Pain: mild  Post-op Assessment: Post-op Vital signs reviewed, Patient's Cardiovascular Status Stable, Respiratory Function Stable, Patent Airway and No signs of Nausea or vomiting  Last Vitals:  Filed Vitals:   06/21/12 2210  BP:   Pulse:   Temp: 36.9 C  Resp: 16    Post-op Vital Signs: stable   Complications: No apparent anesthesia complications

## 2012-06-21 NOTE — Progress Notes (Signed)
ANTIBIOTIC CONSULT NOTE - INITIAL  Pharmacy Consult for Vancomycin Indication: Septic Arthritis left wrist  Allergies  Allergen Reactions  . Amoxicillin-Pot Clavulanate Other (See Comments)    Per nursing home [facility]  . Codeine Other (See Comments)    Per nursing home [facility]  . Lidocaine Other (See Comments)    Per nursing home [facility]  . Nitrofurantoin Other (See Comments)    Per nursing home [facility]    Patient Measurements:   Wt=72.8 kg  Vital Signs: Temp: 98.9 F (37.2 C) (05/14 0350) Temp src: Rectal (05/14 0350) BP: 168/88 mmHg (05/14 0024) Pulse Rate: 70 (05/14 0024) Intake/Output from previous day:   Intake/Output from this shift:    Labs:  Recent Labs  06/20/12 2350  WBC 12.5*  HGB 12.1  PLT 212  CREATININE 0.87   The CrCl is unknown because both a height and weight (above a minimum accepted value) are required for this calculation. No results found for this basename: VANCOTROUGH, VANCOPEAK, VANCORANDOM, GENTTROUGH, GENTPEAK, GENTRANDOM, TOBRATROUGH, TOBRAPEAK, TOBRARND, AMIKACINPEAK, AMIKACINTROU, AMIKACIN,  in the last 72 hours   Microbiology: No results found for this or any previous visit (from the past 720 hour(s)).  Medical History: Past Medical History  Diagnosis Date  . Atrial fibrillation   . Unspecified essential hypertension   . Alzheimer disease   . UTI (urinary tract infection)     Medications:  Scheduled:  . aspirin  81 mg Oral Daily  . benazepril  40 mg Oral Daily  . feeding supplement  237 mL Oral BID  . heparin  5,000 Units Subcutaneous Q8H  . levothyroxine  75 mcg Oral Daily  . metoprolol succinate  100 mg Oral Daily  . vancomycin  750 mg Intravenous Q12H   Infusions:   Assessment: 79 yo admitted with hand pain.  MD ordering Vancomycin per Rx for septic arthritis.  Goal of Therapy:  Vancomycin trough level 15-20 mcg/ml  Plan:   Vancomycin 750mg  IV q12h. (1Gm in ER ~0015) CrCl~44 (N)  F/U SCr/levels/cultures as needed  Lorenza Evangelist 06/21/2012,6:11 AM

## 2012-06-21 NOTE — Progress Notes (Signed)
Zosyn per Pharmacy  A/P: Adding Zosyn to vancomycin for septic arthritis. Start Zosyn 3.375g IV q8 (extended interval infusion) for CrCl > 20 ml/min  Hessie Knows, PharmD, BCPS Pager [phone] 06/21/2012 9:07 PM

## 2012-06-21 NOTE — Care Management Note (Signed)
CARE MANAGEMENT NOTE 06/21/2012  Patient:  SHARMA, LAWRANCE   Account Number:  [id number]  Date Initiated:  06/21/2012  Documentation initiated by:  Kharisma Glasner  Subjective/Objective Assessment:   79 yo female admitted from ALF with septic arthritis.     Action/Plan:   ALF   Anticipated DC Date:     Anticipated DC Plan:  ASSISTED LIVING / REST HOME  In-house referral  Clinical Social Worker      DC Planning Services  CM consult      Choice offered to / List presented to:  NA   DME arranged  NA      DME agency  NA     HH arranged  NA      HH agency  NA   Status of service:  In process, will continue to follow Medicare Important Message given?   (If response is "NO", the following Medicare IM given date fields will be blank) Date Medicare IM given:   Date Additional Medicare IM given:    Discharge Disposition:    Per UR Regulation:  Reviewed for med. necessity/level of care/duration of stay  If discussed at Long Length of Stay Meetings, dates discussed:    Comments:  06/21/12 1557 Jury Caserta,RN,BSN [phone] Pt from ALF. Awaiting PT/OT eval.

## 2012-06-21 NOTE — Anesthesia Preprocedure Evaluation (Addendum)
Anesthesia Evaluation  Patient identified by MRN, date of birth, ID band Patient awake    Reviewed: Allergy & Precautions, H&P , NPO status , Patient's Chart, lab work & pertinent test results, reviewed documented beta blocker date and time   Airway Mallampati: II TM Distance: >3 FB Neck ROM: full    Dental  (+) Edentulous Upper and Edentulous Lower   Pulmonary neg pulmonary ROS,  breath sounds clear to auscultation  Pulmonary exam normal       Cardiovascular hypertension, Pt. on home beta blockers and Pt. on medications + dysrhythmias Atrial Fibrillation Rhythm:Irregular Rate:Normal  ECG AF   Neuro/Psych Alzheimer's negative psych ROS   GI/Hepatic negative GI ROS, Neg liver ROS,   Endo/Other  negative endocrine ROS  Renal/GU negative Renal ROS  negative genitourinary   Musculoskeletal   Abdominal   Peds  Hematology negative hematology ROS (+)   Anesthesia Other Findings   Reproductive/Obstetrics negative OB ROS                          Anesthesia Physical Anesthesia Plan  ASA: III  Anesthesia Plan: General   Post-op Pain Management:    Induction: Intravenous  Airway Management Planned: LMA  Additional Equipment:   Intra-op Plan:   Post-operative Plan:   Informed Consent: I have reviewed the patients History and Physical, chart, labs and discussed the procedure including the risks, benefits and alternatives for the proposed anesthesia with the patient or authorized representative who has indicated his/her understanding and acceptance.   Dental Advisory Given  Plan Discussed with: CRNA and Surgeon  Anesthesia Plan Comments:         Anesthesia Quick Evaluation

## 2012-06-21 NOTE — ED Provider Notes (Addendum)
History     CSN: [id number]  Arrival date & time 06/20/12  2239   First MD Initiated Contact with Patient 06/20/12 2315      Chief Complaint  Patient presents with  . Hand Pain    (Consider location/radiation/quality/duration/timing/severity/associated sxs/prior treatment) HPI HX per family and nursing home report, lives in ECF, developed L wrist redness and swelling tonight, no known trauma/ fall. No F/C. No h/o same. PT unable to provide any reliable history - level 5 caveat applies  Past Medical History  Diagnosis Date  . Atrial fibrillation   . Unspecified essential hypertension   . Alzheimer disease   . UTI (urinary tract infection)     Past Surgical History  Procedure Laterality Date  . Bladder suspension  1953  . Cesarean section  1961  . Abdominal hysterectomy  1964  . Bladder surgery  1977  . Cataract extraction  2003    left eye    Family History  Problem Relation Age of Onset  . Other Mother     lung and heart problems  . Other Father     pace maker, heart problems  . Hypertension Brother   . Other Brother     CABG x3  . Hypertension Sister     History  Substance Use Topics  . Smoking status: Never Smoker   . Smokeless tobacco: Not on file  . Alcohol Use: No    OB History   Grav Para Term Preterm Abortions TAB SAB Ect Mult Living                  Review of Systems  Unable to perform ROS level 5 caveat  Allergies  Amoxicillin-pot clavulanate; Codeine; Lidocaine; and Nitrofurantoin  Home Medications   Current Outpatient Rx  Name  Route  Sig  Dispense  Refill  . acetaminophen (TYLENOL) 325 MG tablet   Oral   Take 650 mg by mouth every 4 (four) hours as needed (headache).         Marland Kitchen aspirin 81 MG chewable tablet   Oral   Chew 81 mg by mouth daily.         . benazepril (LOTENSIN) 20 MG tablet   Oral   Take 2 tablets (40 mg total) by mouth daily.         . feeding supplement (ENSURE IMMUNE HEALTH) LIQD   Oral   Take 237  mLs by mouth 2 (two) times daily. Vanilla flavor          . guaiFENesin-dextromethorphan (ROBITUSSIN DM) 100-10 MG/5ML syrup   Oral   Take 10 mLs by mouth 3 (three) times daily as needed for cough.         . levothyroxine (SYNTHROID, LEVOTHROID) 75 MCG tablet   Oral   Take 75 mcg by mouth daily.          . metoprolol succinate (TOPROL-XL) 50 MG 24 hr tablet   Oral   Take 2 tablets (100 mg total) by mouth daily.         . bisacodyl (DULCOLAX) 10 MG suppository   Rectal   Place 10 mg rectally daily as needed for constipation.         . ondansetron (ZOFRAN) 4 MG tablet   Oral   Take 4 mg by mouth every 8 (eight) hours as needed for nausea.           BP 168/88  Pulse 70  Temp(Src) 98.1 F (36.7 C) (Oral)  Resp  18  SpO2 98%  Physical Exam  Constitutional: She appears well-developed and well-nourished.  HENT:  Head: Normocephalic and atraumatic.  Eyes: EOM are normal. Pupils are equal, round, and reactive to light.  Neck: Neck supple.  Cardiovascular: Normal rate, regular rhythm and intact distal pulses.   Pulmonary/Chest: Effort normal. No respiratory distress.  Musculoskeletal:  LUE: swelling and tenderness over dorsum of wrist and mild swelling to volar aspect, dorsal erythema and inc warmth, distal cap refill and motor intact  Neurological:  Awake, alert, A/O to self only  Skin: Skin is warm and dry.    ED Course  Procedures (including critical care time)  Labs Reviewed  CBC WITH DIFFERENTIAL - Abnormal; Notable for the following:    WBC 12.5 (*)    HCT 35.5 (*)    Neutro Abs 8.3 (*)    Monocytes Absolute 1.1 (*)    All other components within normal limits  BASIC METABOLIC PANEL - Abnormal; Notable for the following:    Sodium 134 (*)    Glucose, Bld 117 (*)    BUN 24 (*)    GFR calc non Af Amer 58 (*)    GFR calc Af Amer 67 (*)    All other components within normal limits  SEDIMENTATION RATE  URIC ACID   Dg Hand Complete Left  06/20/2012    *RADIOLOGY REPORT*  Clinical Data: Hand pain.  LEFT HAND - COMPLETE 3+ VIEW  Comparison: None.  Findings: Advanced degenerative changes within the left wrist. Degenerative changes in the IP joints.  Diffuse bone demineralization.  No acute fracture, subluxation or dislocation visualized.  Soft tissues are intact.  IMPRESSION: Advanced degenerative changes as above.  Bony mineralization.  No acute findings.   Original Report Authenticated By: Charlett Nose, M.D.    IV ABX  MED consulted - DR Beacher May evaluated bedside  4:02 AM d/w Hand, Dr Izora Ribas will see in am.  PT not cooperative with her dementia, family expresses concern about attempting arthrocentesis in ER. Plan admit IV ABx  MDM  L wrist swelling/ pain  Xray, labs, ABx  MED admit        Sunnie Nielsen, MD 06/21/12 1610  Sunnie Nielsen, MD 06/21/12 [phone]

## 2012-06-22 ENCOUNTER — Encounter (HOSPITAL_COMMUNITY): Payer: Self-pay | Admitting: General Surgery

## 2012-06-22 DIAGNOSIS — I4891 Unspecified atrial fibrillation: Secondary | ICD-10-CM

## 2012-06-22 LAB — CBC
HCT: 37 % (ref 36.0–46.0)
Hemoglobin: 12.3 g/dL (ref 12.0–15.0)
MCH: 29.8 pg (ref 26.0–34.0)
MCHC: 33.2 g/dL (ref 30.0–36.0)
MCV: 89.6 fL (ref 78.0–100.0)
Platelets: 205 10*3/uL (ref 150–400)
RBC: 4.13 MIL/uL (ref 3.87–5.11)
RDW: 13.8 % (ref 11.5–15.5)
WBC: 9.4 10*3/uL (ref 4.0–10.5)

## 2012-06-22 LAB — BASIC METABOLIC PANEL
BUN: 12 mg/dL (ref 6–23)
CO2: 25 mEq/L (ref 19–32)
Calcium: 8.7 mg/dL (ref 8.4–10.5)
Chloride: 104 mEq/L (ref 96–112)
Creatinine, Ser: 0.88 mg/dL (ref 0.50–1.10)
GFR calc Af Amer: 66 mL/min — ABNORMAL LOW (ref >90)
GFR calc non Af Amer: 57 mL/min — ABNORMAL LOW (ref >90)
Glucose, Bld: 109 mg/dL — ABNORMAL HIGH (ref 70–99)
Potassium: 4.4 mEq/L (ref 3.5–5.1)
Sodium: 138 mEq/L (ref 135–145)

## 2012-06-22 LAB — URINE CULTURE

## 2012-06-22 LAB — TROPONIN I: Troponin I: <0.3 ng/mL (ref <0.30)

## 2012-06-22 MED ORDER — ALBUTEROL SULFATE HFA 108 (90 BASE) MCG/ACT IN AERS
2.0000 | INHALATION_SPRAY | RESPIRATORY_TRACT | Status: AC
  Administered 2012-06-22: 2 via RESPIRATORY_TRACT
  Filled 2012-06-22: qty 6.7

## 2012-06-22 NOTE — Progress Notes (Signed)
S:  Pt awake and conversing, nad; no acute events since OR   O:Blood pressure 135/58, pulse 106, temperature 98.3 F (36.8 C), temperature source Axillary, resp. rate 18, height 5\' 1"  (1.549 m), weight 72.167 kg (159 lb 1.6 oz), SpO2 98.00%. Results for orders placed during the hospital encounter of 06/20/12  MRSA PCR SCREENING     Status: None   Collection Time    06/21/12  7:05 AM      Result Value Range Status   MRSA by PCR NEGATIVE  NEGATIVE Final   Comment:            The GeneXpert MRSA Assay (FDA     approved for NASAL specimens     only), is one component of a     comprehensive MRSA colonization     surveillance program. It is not     intended to diagnose MRSA     infection nor to guide or     monitor treatment for     MRSA infections.  URINE CULTURE     Status: None   Collection Time    06/21/12  8:58 AM      Result Value Range Status   Specimen Description URINE, RANDOM   Final   Special Requests NONE   Final   Culture  Setup Time 06/21/2012 13:53   Final   Colony Count NO GROWTH   Final   Culture NO GROWTH   Final   Report Status 06/22/2012 FINAL   Final  BODY FLUID CULTURE     Status: None   Collection Time    06/21/12 12:25 PM      Result Value Range Status   Specimen Description SYNOVIAL LT WRIST   Final   Special Requests NONE   Final   Gram Stain     Final   Value: MODERATE WBC PRESENT,BOTH PMN AND MONONUCLEAR     NO ORGANISMS SEEN   Culture NO GROWTH 1 DAY   Final   Report Status PENDING   Incomplete   L wrist:dressing changed, still with erythema and swelling, less painful with wrist motion, no purulent drainage, good cap refill  A: s/p drainage or wrist joint for presumed septic arthritis     P:  No growth on culture, cont hand elevation, move fingers, splint; would continue BS antibiotics for now, anti-inflammatory for discomfort, swelling

## 2012-06-22 NOTE — Progress Notes (Signed)
Spoke with Dr Izora Ribas after pt's surgery. He relates that pt noted w/ increased rate immediatly post-op (120's) and noted her h/o A-Fib. Wanted to discuss w/ primary team weather we might want to transfer pt's level of care to telemetry prior to transport out of PACU.  Pt reported to be otherwise hemodynamically stable. Cindy, PACU RN confirmed that pt  in A-fib with a rate from 105-120. Order placed for telemetry bed. Approx 30 minutes prior to transfer out of PACU pt converted back to NSR w/ rate in the 70's. There have been no c/o's CP. Spoke w/ [name], RN on 4-east who reports that pt has rested quietly since arrival to floor and has remained in NSR. Will obtain Troponin and continue to monitor closely on telemetry at least overnight.   Leanne Chang, NP-C [facility] Pager [phone]

## 2012-06-22 NOTE — Progress Notes (Signed)
Pt in atrial fib for about an hour and a half, converted back to NSR at 0520.  Cleotis Nipper RN-BC

## 2012-06-22 NOTE — Care Management Note (Addendum)
    Page 1 of 2   06/25/2012     5:53:29 PM   CARE MANAGEMENT NOTE 06/25/2012  Patient:  Kaitlyn Lucero, Kaitlyn Lucero   Account Number:  [id number]  Date Initiated:  06/21/2012  Documentation initiated by:  DAVIS,TYMEEKA  Subjective/Objective Assessment:   79 yo female admitted from ALF with septic arthritis.     Action/Plan:   ALF   Anticipated DC Date:  06/25/2012   Anticipated DC Plan:  ASSISTED LIVING / REST HOME  In-house referral  Clinical Social Worker      DC Planning Services  CM consult      Choice offered to / List presented to:  NA   DME arranged  NA      DME agency  NA     HH arranged  NA      HH agency  NA   Status of service:  Completed, signed off Medicare Important Message given?   (If response is "NO", the following Medicare IM given date fields will be blank) Date Medicare IM given:   Date Additional Medicare IM given:    Discharge Disposition:  ASSISTED LIVING  Per UR Regulation:  Reviewed for med. necessity/level of care/duration of stay  If discussed at Long Length of Stay Meetings, dates discussed:    Comments:  06/25/12 Lizandra Zakrzewski RN,BSN NCM WEEKEND [phone] D/C ALF.  06/22/12 Locke Barrell RN,BSN NCM [phone] PT-NO F/U.  06/21/12 1557 Tymeeka Davis,RN,BSN [phone] Pt from ALF. Awaiting PT/OT eval.

## 2012-06-22 NOTE — Progress Notes (Signed)
[facility] PROGRESS NOTE  Kaitlyn Lucero ZOX:[id number] DOB: Dec 17, 1921 DOA: 06/20/2012 PCP: [name], MD  Assessment/Plan: 1. Septic arthritis of left wrist: - Pt is s/p Arthrocentesis, irrigation of L wrist joint - Ortho on board - Awaiting cultures and sensitivities - Will continue broad spectrum antibiotics at this point.   2. Alzheimer's disease  - chronic and stable, continue home meds  3. HTN  - Stable.  Will continue home meds.  4. PAF: chronic problem.  Currently on B blocker and in sinus rhythm.   - Will continue to monitor closely.   Code Status: full Family Communication: discussed with patient and family member. Disposition Plan: Pending improvement in condition.   Consultants:  Ortho: Dr. Izora Ribas  Procedures:  As listed above  Antibiotics:  Vancomycin and Zosyn  HPI/Subjective: No new problems reported. No acute issues reported overnight.  Objective: Filed Vitals:   06/22/12 0026 06/22/12 0054 06/22/12 0100 06/22/12 0451  BP: 131/53 133/53  133/76  Pulse: 70 68  110  Temp:    97.9 F (36.6 C)  TempSrc:    Axillary  Resp: 18 18  18   Height:      Weight:      SpO2: 92% 91% 96% 97%    Intake/Output Summary (Last 24 hours) at 06/22/12 1309 Last data filed at 06/22/12 0900  Gross per 24 hour  Intake 2069.67 ml  Output      0 ml  Net 2069.67 ml   Filed Weights   06/21/12 0657  Weight: 72.167 kg (159 lb 1.6 oz)    Exam:   General:  Pt in NAD, Alert and Awake  Cardiovascular: RRR, No MRG  Respiratory: CTA BL, no wheezes  Abdomen: soft, NT, ND  Musculoskeletal: L hand guaze in place, no cyanosis   Data Reviewed: Basic Metabolic Panel:  Recent Labs Lab 06/20/12 2350 06/22/12 0525  NA 134* 138  K 4.1 4.4  CL 101 104  CO2 23 25  GLUCOSE 117* 109*  BUN 24* 12  CREATININE 0.87 0.88  CALCIUM 8.9 8.7   Liver Function Tests: No results found for this basename: AST, ALT, ALKPHOS, BILITOT, PROT, ALBUMIN,   in the last 168 hours No results found for this basename: LIPASE, AMYLASE,  in the last 168 hours No results found for this basename: AMMONIA,  in the last 168 hours CBC:  Recent Labs Lab 06/20/12 2350 06/22/12 0525  WBC 12.5* 9.4  NEUTROABS 8.3*  --   HGB 12.1 12.3  HCT 35.5* 37.0  MCV 88.8 89.6  PLT 212 205   Cardiac Enzymes:  Recent Labs Lab 06/22/12 0420  TROPONINI <0.30   BNP (last 3 results) No results found for this basename: PROBNP,  in the last 8760 hours CBG:  Recent Labs Lab 06/21/12 1934  GLUCAP 126*    Recent Results (from the past 240 hour(s))  MRSA PCR SCREENING     Status: None   Collection Time    06/21/12  7:05 AM      Result Value Range Status   MRSA by PCR NEGATIVE  NEGATIVE Final   Comment:            The GeneXpert MRSA Assay (FDA     approved for NASAL specimens     only), is one component of a     comprehensive MRSA colonization     surveillance program. It is not     intended to diagnose MRSA     infection nor to guide or  monitor treatment for     MRSA infections.     Studies: Dg Chest Port 1 View  06/21/2012   *RADIOLOGY REPORT*  Clinical Data: Cough, congestion.  PORTABLE CHEST - 1 VIEW  Comparison: 04/08/2012  Findings: Cardiomegaly. There is hyperinflation of the lungs compatible with COPD.  Calcified granuloma the right lower lung. No acute opacities or effusions.  Chronic increased markings throughout the lungs.  No acute bony abnormality.  IMPRESSION: COPD/chronic changes.  Cardiomegaly.  No acute findings.   Original Report Authenticated By: Charlett Nose, M.D.   Dg Hand Complete Left  06/20/2012   *RADIOLOGY REPORT*  Clinical Data: Hand pain.  LEFT HAND - COMPLETE 3+ VIEW  Comparison: None.  Findings: Advanced degenerative changes within the left wrist. Degenerative changes in the IP joints.  Diffuse bone demineralization.  No acute fracture, subluxation or dislocation visualized.  Soft tissues are intact.  IMPRESSION:  Advanced degenerative changes as above.  Bony mineralization.  No acute findings.   Original Report Authenticated By: Charlett Nose, M.D.    Scheduled Meds: . aspirin  81 mg Oral Daily  . benazepril  40 mg Oral Daily  . feeding supplement  237 mL Oral BID  . heparin  5,000 Units Subcutaneous Q8H  . levothyroxine  75 mcg Oral Daily  . metoprolol succinate  100 mg Oral Daily  . piperacillin-tazobactam (ZOSYN)  IV  3.375 g Intravenous Q8H  . vancomycin  750 mg Intravenous Q12H   Continuous Infusions: . dextrose 5 % and 0.45 % NaCl with KCl 20 mEq/L 20 mL/hr at 06/22/12 0212    Principal Problem:   Septic arthritis of wrist, left Active Problems:   ALZHEIMERS DISEASE   HYPERTENSION    Time spent: > 35 minutes    Kaitlyn Lucero  [facility] Pager [phone]. If 7PM-7AM, please contact night-coverage at [email or web address], password [facility] 06/22/2012, 1:09 PM  LOS: 2 days

## 2012-06-22 NOTE — Progress Notes (Signed)
In and out of atrial fib.  Cleotis Nipper RN-BC

## 2012-06-22 NOTE — Evaluation (Signed)
Physical Therapy Evaluation Patient Details Name: Kaitlyn Lucero MRN: [id number] DOB: [birth year] Today's Date: 06/22/2012 Time: 4098-1191 PT Time Calculation (min): 17 min  PT Assessment / Plan / Recommendation Clinical Impression  79 y.o. female with h/o dementia admitted for L wrist infection, s/p arthrocentesis and irrigation. Pt was independent with mobility PTA at SNF. Pt ambulated 100' with min/guard assist. Pt can return to facility from PT standpoint. No further PT needs, will sign off.     PT Assessment  Patent does not need any further PT services    Follow Up Recommendations  No PT follow up    Does the patient have the potential to tolerate intense rehabilitation      Barriers to Discharge        Equipment Recommendations       Recommendations for Other Services     Frequency      Precautions / Restrictions Precautions Precautions: None Restrictions Weight Bearing Restrictions: No   Pertinent Vitals/Pain *no c/o pain*      Mobility  Bed Mobility Bed Mobility: Supine to Sit Supine to Sit: 4: Min assist Details for Bed Mobility Assistance: min A to pivot hips to EOB Transfers Transfers: Sit to Stand;Stand to Sit Sit to Stand: 4: Min assist;From bed Stand to Sit: 4: Min assist;With armrests;To chair/3-in-1 Details for Transfer Assistance: min A to rise/steady, VCs for hand placement Ambulation/Gait Ambulation/Gait Assistance: 4: Min guard Ambulation Distance (Feet): 100 Feet Assistive device: 1 person hand held assist Gait Pattern: Within Functional Limits General Gait Details: min/guard assist to direct pt 2* dementia    Exercises     PT Diagnosis:    PT Problem List:   PT Treatment Interventions:     PT Goals    Visit Information  Last PT Received On: 06/22/12 Assistance Needed: +1    Subjective Data  Subjective: I couldn't eat the eggs.  Patient Stated Goal: none stated   Prior Functioning  Home Living Available Help at Discharge:  Skilled Nursing Facility Type of Home: Skilled Nursing Facility Prior Function Level of Independence: Needs assistance Needs Assistance: Bathing;Dressing;Meal Prep;Light Housekeeping Comments: walked independently without assistive device PTA Communication Communication: No difficulties    Cognition  Cognition Arousal/Alertness: Awake/alert Behavior During Therapy: WFL for tasks assessed/performed Overall Cognitive Status: History of cognitive impairments - at baseline    Extremity/Trunk Assessment Right Upper Extremity Assessment RUE ROM/Strength/Tone: Within functional levels Left Upper Extremity Assessment LUE ROM/Strength/Tone: Deficits LUE ROM/Strength/Tone Deficits: shoulder/elbow appear to be WFL, L wrist wrapped in dressing Right Lower Extremity Assessment RLE ROM/Strength/Tone: Within functional levels RLE Sensation: WFL - Light Touch RLE Coordination: WFL - gross/fine motor Left Lower Extremity Assessment LLE ROM/Strength/Tone: Within functional levels LLE Sensation: WFL - Light Touch LLE Coordination: WFL - gross/fine motor Trunk Assessment Trunk Assessment: Normal   Balance Balance Balance Assessed: Yes Static Sitting Balance Static Sitting - Balance Support: Right upper extremity supported;Feet supported Static Sitting - Level of Assistance: 5: Stand by assistance Static Sitting - Comment/# of Minutes: 2  End of Session PT - End of Session Activity Tolerance: Patient tolerated treatment well Patient left: in chair;with call bell/phone within reach;with family/visitor present Nurse Communication: Mobility status  GP     Ralene Bathe Kistler 06/22/2012, 10:52 AM [phone]

## 2012-06-22 NOTE — Op Note (Signed)
Kaitlyn Lucero, Kaitlyn Lucero              ACCOUNT NO.:  [id number]  MEDICAL RECORD NO.:  [id number]  LOCATION:  1405                         FACILITY:  [facility]  PHYSICIAN:  Johnette Abraham, MD    DATE OF BIRTH:  1921-09-09  DATE OF PROCEDURE:  06/21/2012 DATE OF DISCHARGE:                              OPERATIVE REPORT   PREOPERATIVE DIAGNOSIS:  Red swollen presumed infection of the left wrist joint.  POSTOPERATIVE DIAGNOSIS:  Red swollen presumed infection of the left wrist joint.  PROCEDURES: 1. Arthrocentesis of the left wrist. 2. Irrigation of the radiocarpal joint.  SURGEON:  Johnette Abraham, MD  ANESTHESIA:  General.  SPECIMENS:  None.  FINDINGS:  Turbid fluid aspirated from the radiocarpal joint.  PATIENT DISPOSITION:  PACU, hemodynamically stable.  INDICATIONS:  [name] presented to the emergency room early morning with red hot swollen wrist, this was aspirated and cell count came back, approximately 6800 Gram stain still pending.  It was decided because of worsening erythema and swelling of the wrist, despite being on vancomycin, that arthrocentesis and joint irrigation be performed for presumed septic wrist.  Risks, benefits, and alternatives were thoroughly discussed with the patient's caregiver.  Consent was obtained and the patient was taken to the surgery.  DESCRIPTION OF PROCEDURE:  The patient was taken to the operating room, placed supine on the operating table.  General anesthesia was administered without difficulty.  The left upper extremity was prepped and draped in normal sterile fashion.  The area of most erythema was radially, dorsally.  The wrist was extended and in-line traction was performed to open up the joint space.  A #14-gauge angiocatheter was inserted into the radiocarpal joint.  Aspiration revealed turbid-colored yellowish fluid as that obtained during aspiration on the floor.  The needle was removed.  The angiocatheter was left in place.   Additional aspiration, to no additional fluid was obtained, was performed.  This was followed by thorough irrigation of the wrist joint with extending and then evacuating at each time, approximately 500 mL of irrigation fluid were instilled and evacuated in 10 mL increments until all the fluid was clear and thorough irrigation was felt performed.  Afterwards, approximately 10 mL of 0.5% plain Marcaine was infiltrated in the wrist joint.  A sterile dressing and wrist splint were applied.  The patient tolerated the procedure well.  Her heart rate was a little bit elevated into the, at times, 120s.  It was felt that the patient may be transitioning into atrial fibrillation which she has had a history for and I am not sure of her compatibility or reliance over her medications this morning.  Hospitalist will be paged and additional arrangements for potentially a monitored bed will be arranged for the patient.     Johnette Abraham, MD     HCC/MEDQ  D:  06/21/2012  T:  06/22/2012  Job:  [id number]

## 2012-06-23 DIAGNOSIS — G9340 Encephalopathy, unspecified: Secondary | ICD-10-CM

## 2012-06-23 MED ORDER — LORAZEPAM 0.5 MG PO TABS
0.5000 mg | ORAL_TABLET | Freq: Four times a day (QID) | ORAL | Status: DC | PRN
  Administered 2012-06-23 – 2012-06-24 (×2): 0.5 mg via ORAL
  Filled 2012-06-23 (×2): qty 1

## 2012-06-23 NOTE — Progress Notes (Signed)
ANTIBIOTIC CONSULT NOTE - follow up  Pharmacy Consult for Vancomycin/Zosyn Indication: Septic Arthritis left wrist  Allergies  Allergen Reactions  . Amoxicillin-Pot Clavulanate Diarrhea  . Codeine Other (See Comments)    Per nursing home mar  . Lidocaine Other (See Comments)    Per nursing home mar  . Nitrofurantoin Other (See Comments)    Per nursing home mar  . Banana Flavor Rash    Patient Measurements: Height: 5\' 1"  (154.9 cm) Weight: 159 lb 1.6 oz (72.167 kg) IBW/kg (Calculated) : 47.8  Vital Signs: Temp: 98.4 F (36.9 C) (05/16 0600) Temp src: Oral (05/16 0600) BP: 132/53 mmHg (05/16 0600) Pulse Rate: 70 (05/16 0600)  Labs:  Recent Labs  06/20/12 2350 06/22/12 0525  WBC 12.5* 9.4  HGB 12.1 12.3  PLT 212 205  CREATININE 0.87 0.88   Estimated Creatinine Clearance: 40.2 ml/min (by C-G formula based on Cr of 0.88). No results found for this basename: Rolm Gala, [place], GENTTROUGH, GENTPEAK, GENTRANDOM, TOBRATROUGH, TOBRAPEAK, TOBRARND, AMIKACINPEAK, AMIKACINTROU, AMIKACIN,  in the last 72 hours   Microbiology: 5/14 MRSA PCR: negative 5/14 UA: neg nitrites, moderate leukocytes, few bacteria, rare squams 5/14 urine: NG F 5/14 Synovial fluid L wrist: NGTD (04/07/12 UCx: viridans strep AND E. Coli (R=amp, cipro, gent, LVQ, Bactrim; I=tobra; S=Ancef, CTX, Macrobid, Zosyn)- treated w CTX x 8 days  5/14 >> vancomycin >> 5/14 >> Zosyn >>   Medical History: Past Medical History  Diagnosis Date  . Atrial fibrillation   . Unspecified essential hypertension   . Alzheimer disease   . UTI (urinary tract infection)    Assessment: 79 yo admitted with hand pain 5/13.   D#3 Vancomycin 750mg  IV q12h and Zosyn extended infusion 3.375g IV q8h for septic arthritis  Cx remain negative to date  Pt afebrile, WBC wnl  Scr wnl and stable  Goal of Therapy:  Vancomycin trough level 15-20 mcg/ml  Plan:  No change to doses No VT yet Await decision  from MD to narrow abx  Gwen Her PharmD  [phone] 06/23/2012 10:24 AM

## 2012-06-23 NOTE — Progress Notes (Signed)
[facility] PROGRESS NOTE  Kaitlyn Lucero WUJ:[id number] DOB: 1921/04/08 DOA: 06/20/2012 PCP: [name], MD  Assessment/Plan: 1. Septic arthritis of left wrist: - Pt is s/p Arthrocentesis, irrigation of L wrist joint - Ortho on board and we will await disposition from their standpoint. - Awaiting cultures and sensitivities although reportedly no growth - Will continue broad spectrum antibiotics at this point. Although if considering oral regimen would cover for gram positive organisms and would consider bactrim, doxy, or clindamycin.  2. Alzheimer's disease  - chronic and stable, continue home meds  3. HTN  - Stable.  Will continue home meds.  4. PAF: chronic problem.  Currently on B blocker and in sinus rhythm.   - Will continue to monitor closely.   Code Status: full Family Communication: discussed with patient and family member. Disposition Plan: Pending improvement in condition. Likely d/c in 1-2 more days   Consultants:  Ortho: Dr. Izora Ribas  Procedures:  As listed above  Antibiotics:  Vancomycin and Zosyn  HPI/Subjective: No new problems reported. No acute issues reported overnight. Patient reports feeling better.  Objective: Filed Vitals:   06/22/12 0451 06/22/12 1342 06/22/12 2200 06/23/12 0600  BP: 133/76 135/58 125/58 132/53  Pulse: 110 106 68 70  Temp: 97.9 F (36.6 C) 98.3 F (36.8 C) 98.3 F (36.8 C) 98.4 F (36.9 C)  TempSrc: Axillary Axillary Oral Oral  Resp: 18 18 18 18   Height:      Weight:      SpO2: 97% 98% 93% 97%    Intake/Output Summary (Last 24 hours) at 06/23/12 1306 Last data filed at 06/23/12 0934  Gross per 24 hour  Intake 652.33 ml  Output      0 ml  Net 652.33 ml   Filed Weights   06/21/12 0657  Weight: 72.167 kg (159 lb 1.6 oz)    Exam:   General:  Pt in NAD, Alert and Awake  Cardiovascular: RRR, No MRG  Respiratory: CTA BL, no wheezes  Abdomen: soft, NT, ND  Musculoskeletal: L hand guaze  in place, no cyanosis   Data Reviewed: Basic Metabolic Panel:  Recent Labs Lab 06/20/12 2350 06/22/12 0525  NA 134* 138  K 4.1 4.4  CL 101 104  CO2 23 25  GLUCOSE 117* 109*  BUN 24* 12  CREATININE 0.87 0.88  CALCIUM 8.9 8.7   Liver Function Tests: No results found for this basename: AST, ALT, ALKPHOS, BILITOT, PROT, ALBUMIN,  in the last 168 hours No results found for this basename: LIPASE, AMYLASE,  in the last 168 hours No results found for this basename: AMMONIA,  in the last 168 hours CBC:  Recent Labs Lab 06/20/12 2350 06/22/12 0525  WBC 12.5* 9.4  NEUTROABS 8.3*  --   HGB 12.1 12.3  HCT 35.5* 37.0  MCV 88.8 89.6  PLT 212 205   Cardiac Enzymes:  Recent Labs Lab 06/22/12 0420  TROPONINI <0.30   BNP (last 3 results) No results found for this basename: PROBNP,  in the last 8760 hours CBG:  Recent Labs Lab 06/21/12 1934  GLUCAP 126*    Recent Results (from the past 240 hour(s))  MRSA PCR SCREENING     Status: None   Collection Time    06/21/12  7:05 AM      Result Value Range Status   MRSA by PCR NEGATIVE  NEGATIVE Final   Comment:            The GeneXpert MRSA Assay (FDA  approved for NASAL specimens     only), is one component of a     comprehensive MRSA colonization     surveillance program. It is not     intended to diagnose MRSA     infection nor to guide or     monitor treatment for     MRSA infections.  URINE CULTURE     Status: None   Collection Time    06/21/12  8:58 AM      Result Value Range Status   Specimen Description URINE, RANDOM   Final   Special Requests NONE   Final   Culture  Setup Time 06/21/2012 13:53   Final   Colony Count NO GROWTH   Final   Culture NO GROWTH   Final   Report Status 06/22/2012 FINAL   Final  BODY FLUID CULTURE     Status: None   Collection Time    06/21/12 12:25 PM      Result Value Range Status   Specimen Description SYNOVIAL LT WRIST   Final   Special Requests NONE   Final   Gram Stain      Final   Value: MODERATE WBC PRESENT,BOTH PMN AND MONONUCLEAR     NO ORGANISMS SEEN   Culture NO GROWTH 1 DAY   Final   Report Status PENDING   Incomplete     Studies: Dg Chest Port 1 View  06/21/2012   *RADIOLOGY REPORT*  Clinical Data: Cough, congestion.  PORTABLE CHEST - 1 VIEW  Comparison: 04/08/2012  Findings: Cardiomegaly. There is hyperinflation of the lungs compatible with COPD.  Calcified granuloma the right lower lung. No acute opacities or effusions.  Chronic increased markings throughout the lungs.  No acute bony abnormality.  IMPRESSION: COPD/chronic changes.  Cardiomegaly.  No acute findings.   Original Report Authenticated By: Charlett Nose, M.D.    Scheduled Meds: . aspirin  81 mg Oral Daily  . benazepril  40 mg Oral Daily  . feeding supplement  237 mL Oral BID  . heparin  5,000 Units Subcutaneous Q8H  . levothyroxine  75 mcg Oral Daily  . metoprolol succinate  100 mg Oral Daily  . piperacillin-tazobactam (ZOSYN)  IV  3.375 g Intravenous Q8H  . vancomycin  750 mg Intravenous Q12H   Continuous Infusions: . dextrose 5 % and 0.45 % NaCl with KCl 20 mEq/L 20 mL/hr at 06/22/12 0212    Principal Problem:   Septic arthritis of wrist, left Active Problems:   ALZHEIMERS DISEASE   HYPERTENSION   PAROXYSMAL ATRIAL FIBRILLATION    Time spent: > 35 minutes    Penny Pia  [facility] Pager [phone]. If 7PM-7AM, please contact night-coverage at [email or web address], password [email or web address] 06/23/2012, 1:06 PM  LOS: 3 days

## 2012-06-23 NOTE — Progress Notes (Signed)
S: in nad, doesn't complain about L wrist hurting, is not keeping elevated  O:Blood pressure 132/53, pulse 70, temperature 98.4 F (36.9 C), temperature source Oral, resp. rate 18, height 5\' 1"  (1.549 m), weight 72.167 kg (159 lb 1.6 oz), SpO2 97.00%. Results for orders placed during the hospital encounter of 06/20/12  MRSA PCR SCREENING     Status: None   Collection Time    06/21/12  7:05 AM      Result Value Range Status   MRSA by PCR NEGATIVE  NEGATIVE Final   Comment:            The GeneXpert MRSA Assay (FDA     approved for NASAL specimens     only), is one component of a     comprehensive MRSA colonization     surveillance program. It is not     intended to diagnose MRSA     infection nor to guide or     monitor treatment for     MRSA infections.  URINE CULTURE     Status: None   Collection Time    06/21/12  8:58 AM      Result Value Range Status   Specimen Description URINE, RANDOM   Final   Special Requests NONE   Final   Culture  Setup Time 06/21/2012 13:53   Final   Colony Count NO GROWTH   Final   Culture NO GROWTH   Final   Report Status 06/22/2012 FINAL   Final  BODY FLUID CULTURE     Status: None   Collection Time    06/21/12 12:25 PM      Result Value Range Status   Specimen Description SYNOVIAL LT WRIST   Final   Special Requests NONE   Final   Gram Stain     Final   Value: MODERATE WBC PRESENT,BOTH PMN AND MONONUCLEAR     NO ORGANISMS SEEN   Culture NO GROWTH 2 DAYS   Final   Report Status PENDING   Incomplete   L wrist: still with some erythema, warmth, swelling although better, no real discomfort with wrist motion, some edema of fingers, limited motion of them  A:s/p irrigation of wrist joint for presumed septic wrist - cx's negative    P: removable splint, wash hand with soap and water, elevate, move fingers, ok with oral BS antibiotics, may f/u in office in 1-2 weeks as needed if wrist no better or worse.

## 2012-06-24 LAB — BODY FLUID CULTURE

## 2012-06-24 MED ORDER — HYDRALAZINE HCL 20 MG/ML IJ SOLN
10.0000 mg | Freq: Four times a day (QID) | INTRAMUSCULAR | Status: DC | PRN
  Administered 2012-06-24: 10 mg via INTRAVENOUS
  Filled 2012-06-24: qty 1

## 2012-06-24 MED ORDER — DOXYCYCLINE HYCLATE 100 MG PO TABS
100.0000 mg | ORAL_TABLET | Freq: Two times a day (BID) | ORAL | Status: DC
  Administered 2012-06-25: 100 mg via ORAL
  Filled 2012-06-24 (×2): qty 1

## 2012-06-24 MED ORDER — TRAMADOL HCL 50 MG PO TABS
50.0000 mg | ORAL_TABLET | Freq: Four times a day (QID) | ORAL | Status: DC | PRN
  Administered 2012-06-24: 50 mg via ORAL
  Filled 2012-06-24: qty 1

## 2012-06-24 MED ORDER — TRAMADOL HCL 50 MG PO TABS: 50.0000 mg | ORAL_TABLET | Freq: Four times a day (QID) | ORAL | Status: DC | PRN

## 2012-06-24 MED ORDER — DOXYCYCLINE HYCLATE 50 MG PO CAPS: 100.0000 mg | ORAL_CAPSULE | Freq: Two times a day (BID) | ORAL | Status: DC

## 2012-06-24 NOTE — Progress Notes (Signed)
Mechele Dawley, LPN from [facility] came to see and assess the pt. Per Mechele Dawley, facility will be able to take pt tomorrow 5/18. Isao Seltzer, Lavone Orn, RN

## 2012-06-24 NOTE — Discharge Summary (Addendum)
Physician Discharge Summary  Kaitlyn Lucero WJX:[id number] DOB: [birth year] DOA: 06/20/2012  PCP: [name], MD  Admit date: 06/20/2012 Discharge date: 06/24/2012  Time spent: > 35 minutes  Recommendations for Outpatient Follow-up:  1. Please be sure to follow up with your primary care physician 2. Also you will need to have your blood pressure rechecked 3. Should pain not be well controlled you are to contact your orthopaedic surgeon for further recommendations.  Discharge Diagnoses:  Principal Problem:   Septic arthritis of wrist, left Active Problems:   ALZHEIMERS DISEASE   HYPERTENSION   PAROXYSMAL ATRIAL FIBRILLATION   Discharge Condition: stable  Diet recommendation: Heart Healthy Addendum: Patient may continue with her feeding supplement prior to admission with ensure 2 cans BID. (please refer to home medication list prior to this admission)  Filed Weights   06/21/12 0657  Weight: 72.167 kg (159 lb 1.6 oz)    History of present illness:  From original HPI: 79 y/o Caucasian female with h/o atrial fibrillation, Alzheimer disease, HTN; who presented to the hospital with L wrist septic arthritis  Hospital Course:  1. Septic arthritis of left wrist: - Pt is s/p Arthrocentesis, irrigation of L wrist joint  - Ortho on board and we will await disposition from their standpoint.  - Cultures and sensitivities reported no growth  - Will discharge on 7 more days of oral antibiotics with doxycycline 100 mg po BID.  Addendum:  Per Ortho's recommendations: A:s/p irrigation of wrist joint for presumed septic wrist - cx's negative  P: removable splint, wash hand with soap and water, elevate, move fingers, ok with oral BS antibiotics, may f/u in office in 1-2 weeks as needed if wrist no better or worse.    2. Alzheimer's disease  - chronic and stable, continue home meds   3. HTN  - Stable. Will continue home meds.  Felt to be uncontrolled during the night prior to  discharge due to discomfort.  4. PAF: chronic problem - Currently on B blocker and in sinus rhythm.  - On aspirin, likely preferred over coumadin due to advanced dementia and risks of fall as well as compliance issues base on history.   Procedures:  As mentioned above  Consultations:  Ortho: Dr. Izora Ribas  Discharge Exam: Filed Vitals:   06/23/12 2055 06/24/12 0527 06/24/12 0604 06/24/12 0700  BP: 143/82 145/107 207/70 161/56  Pulse: 72 65    Temp: 98.3 F (36.8 C) 98.7 F (37.1 C)    TempSrc: Oral Oral    Resp: 18 20    Height:      Weight:      SpO2: 99% 99%      General: Pt in NAD, Alert and Awake. Sitting up smiling Cardiovascular: RRR, No MRG Respiratory: CTA BL, no wheezes  Discharge Instructions  Discharge Orders   Future Orders Complete By Expires     Call MD for:  difficulty breathing, headache or visual disturbances  As directed     Call MD for:  severe uncontrolled pain  As directed     Call MD for:  temperature >100.4  As directed     Diet - low sodium heart healthy  As directed     Discharge instructions  As directed     Comments:      Please be sure to follow up with your orthopaedic surgeon in 1-2 weeks or sooner should any new concerns arise.    Increase activity slowly  As directed  Medication List    TAKE these medications       acetaminophen 325 MG tablet  Commonly known as:  TYLENOL  Take 650 mg by mouth every 4 (four) hours as needed (headache).     aspirin 81 MG chewable tablet  Chew 81 mg by mouth daily.     benazepril 20 MG tablet  Commonly known as:  LOTENSIN  Take 2 tablets (40 mg total) by mouth daily.     bisacodyl 10 MG suppository  Commonly known as:  DULCOLAX  Place 10 mg rectally daily as needed for constipation.     feeding supplement Liqd  Take 237 mLs by mouth 2 (two) times daily. Vanilla flavor     guaiFENesin-dextromethorphan 100-10 MG/5ML syrup  Commonly known as:  ROBITUSSIN DM  Take 10 mLs by mouth 3  (three) times daily as needed for cough.     levothyroxine 75 MCG tablet  Commonly known as:  SYNTHROID, LEVOTHROID  Take 75 mcg by mouth daily.     metoprolol succinate 50 MG 24 hr tablet  Commonly known as:  TOPROL-XL  Take 2 tablets (100 mg total) by mouth daily.     ondansetron 4 MG tablet  Commonly known as:  ZOFRAN  Take 4 mg by mouth every 8 (eight) hours as needed for nausea.     traMADol 50 MG tablet  Commonly known as:  ULTRAM  Take 1 tablet (50 mg total) by mouth every 6 (six) hours as needed for pain.       Allergies  Allergen Reactions  . Amoxicillin-Pot Clavulanate Diarrhea  . Codeine Other (See Comments)    Per nursing home [facility]  . Lidocaine Other (See Comments)    Per nursing home [facility]  . Nitrofurantoin Other (See Comments)    Per nursing home [facility]  . Banana Flavor Rash      The results of significant diagnostics from this hospitalization (including imaging, microbiology, ancillary and laboratory) are listed below for reference.    Significant Diagnostic Studies: Dg Chest Port 1 View  06/21/2012   *RADIOLOGY REPORT*  Clinical Data: Cough, congestion.  PORTABLE CHEST - 1 VIEW  Comparison: 04/08/2012  Findings: Cardiomegaly. There is hyperinflation of the lungs compatible with COPD.  Calcified granuloma the right lower lung. No acute opacities or effusions.  Chronic increased markings throughout the lungs.  No acute bony abnormality.  IMPRESSION: COPD/chronic changes.  Cardiomegaly.  No acute findings.   Original Report Authenticated By: Charlett Nose, M.D.   Dg Hand Complete Left  06/20/2012   *RADIOLOGY REPORT*  Clinical Data: Hand pain.  LEFT HAND - COMPLETE 3+ VIEW  Comparison: None.  Findings: Advanced degenerative changes within the left wrist. Degenerative changes in the IP joints.  Diffuse bone demineralization.  No acute fracture, subluxation or dislocation visualized.  Soft tissues are intact.  IMPRESSION: Advanced degenerative changes as above.  Bony  mineralization.  No acute findings.   Original Report Authenticated By: Charlett Nose, M.D.    Microbiology: Recent Results (from the past 240 hour(s))  MRSA PCR SCREENING     Status: None   Collection Time    06/21/12  7:05 AM      Result Value Range Status   MRSA by PCR NEGATIVE  NEGATIVE Final   Comment:            The GeneXpert MRSA Assay (FDA     approved for NASAL specimens     only), is one component of a  comprehensive MRSA colonization     surveillance program. It is not     intended to diagnose MRSA     infection nor to guide or     monitor treatment for     MRSA infections.  URINE CULTURE     Status: None   Collection Time    06/21/12  8:58 AM      Result Value Range Status   Specimen Description URINE, RANDOM   Final   Special Requests NONE   Final   Culture  Setup Time 06/21/2012 13:53   Final   Colony Count NO GROWTH   Final   Culture NO GROWTH   Final   Report Status 06/22/2012 FINAL   Final  BODY FLUID CULTURE     Status: None   Collection Time    06/21/12 12:25 PM      Result Value Range Status   Specimen Description SYNOVIAL LT WRIST   Final   Special Requests NONE   Final   Gram Stain     Final   Value: MODERATE WBC PRESENT,BOTH PMN AND MONONUCLEAR     NO ORGANISMS SEEN   Culture NO GROWTH 2 DAYS   Final   Report Status PENDING   Incomplete     Labs: Basic Metabolic Panel:  Recent Labs Lab 06/20/12 2350 06/22/12 0525  NA 134* 138  K 4.1 4.4  CL 101 104  CO2 23 25  GLUCOSE 117* 109*  BUN 24* 12  CREATININE 0.87 0.88  CALCIUM 8.9 8.7   Liver Function Tests: No results found for this basename: AST, ALT, ALKPHOS, BILITOT, PROT, ALBUMIN,  in the last 168 hours No results found for this basename: LIPASE, AMYLASE,  in the last 168 hours No results found for this basename: AMMONIA,  in the last 168 hours CBC:  Recent Labs Lab 06/20/12 2350 06/22/12 0525  WBC 12.5* 9.4  NEUTROABS 8.3*  --   HGB 12.1 12.3  HCT 35.5* 37.0  MCV 88.8  89.6  PLT 212 205   Cardiac Enzymes:  Recent Labs Lab 06/22/12 0420  TROPONINI <0.30   BNP: BNP (last 3 results) No results found for this basename: PROBNP,  in the last 8760 hours CBG:  Recent Labs Lab 06/21/12 1934  GLUCAP 126*       Signed:  Penny Pia  [facility] 06/24/2012, 9:51 AM    Addendum: Patient has been evaluated today prior to discharge and is ok for discharge.  Had elevated blood pressures and as a result Imdur was started.  Has tolerated imdur well and on discharge her last blood pressure was 146/58

## 2012-06-24 NOTE — Progress Notes (Signed)
Patient's BP was elevated this AM at 147/107 and then 207/70. MD notified and IV hydralazine given.  BP down to 161/56 about 30 min after meds given. Will continue to monitor. Leeroy Cha

## 2012-06-24 NOTE — Progress Notes (Signed)
Per MD, Pt ready for d/c.  Notified Solicitor at [facility].    Completed FL2 and faxed it, along with d/c summary to [facility].  After going back and forth throughout the day with regard to Pt's wound dressing, [facility] called at the time of this writing and stated that the facility cannot accept the Pt over the weekend; facility will have to re-assess on Monday.  Notified RN.  RN to notify MD.  Providence Crosby, LCSWA Clinical Social Work [phone]

## 2012-06-25 MED ORDER — ISOSORBIDE MONONITRATE 15 MG HALF TABLET: 15.0000 mg | ORAL_TABLET | Freq: Every day | ORAL | Status: DC

## 2012-06-25 MED ORDER — ISOSORBIDE MONONITRATE 15 MG HALF TABLET
15.0000 mg | ORAL_TABLET | Freq: Every day | ORAL | Status: DC
  Administered 2012-06-25: 15 mg via ORAL
  Filled 2012-06-25: qty 1

## 2012-06-25 NOTE — Progress Notes (Addendum)
Spoke with [facility].  [facility] B[facility] stated that she has everything she needs to accept Pt.    Per MD, Pt ready for d/c.  Notified RN, Pt's daughter and facility.  Facility ready to receive Pt.  Providence Crosby, LCSWA Clinical Social Work [phone]

## 2012-06-25 NOTE — Progress Notes (Signed)
Per RN, Pt ready for d/c.  RN to notify RN at [facility].  Arranged ambulance transport.  Pt to be d/c'd.  Providence Crosby, LCSWA Clinical Social Work [phone]

## 2012-06-25 NOTE — Progress Notes (Signed)
Message received from [place] at [facility].  Informed that [name] doesn't get in until around 9.  LM for [name].  Providence Crosby, LCSWA Clinical Social Work [phone]

## 2012-06-25 NOTE — Progress Notes (Signed)
T/c from RN stating that Pt's BP is elevated.  May need to hold d/c for now.  RN to contact CSW when, if Pt ready for d/c today.  Providence Crosby, LCSWA Clinical Social Work [phone]

## 2012-06-25 NOTE — Progress Notes (Signed)
Call from RN stating that AVS had been recently updated and news meds have been added.  Updated FL2.  LM for [facility] alerting her to changes to the FL2.  Faxed updated FL2 to [place].  Providence Crosby, LCSWA Clinical Social Work [phone]

## 2012-06-26 ENCOUNTER — Encounter (HOSPITAL_COMMUNITY): Payer: Self-pay | Admitting: Emergency Medicine

## 2012-06-26 ENCOUNTER — Encounter (HOSPITAL_COMMUNITY): Payer: Self-pay | Admitting: Pharmacy Technician

## 2012-06-26 ENCOUNTER — Emergency Department (HOSPITAL_COMMUNITY)
Admission: EM | Admit: 2012-06-26 | Discharge: 2012-06-26 | Disposition: A | Discharge status: Skilled Nursing Facility | Payer: Medicare Other | Attending: Emergency Medicine | Admitting: Emergency Medicine

## 2012-06-26 DIAGNOSIS — G309 Alzheimer's disease, unspecified: Secondary | ICD-10-CM | POA: Insufficient documentation

## 2012-06-26 DIAGNOSIS — E872 Acidosis, unspecified: Secondary | ICD-10-CM | POA: Insufficient documentation

## 2012-06-26 DIAGNOSIS — Z880 Allergy status to penicillin: Secondary | ICD-10-CM | POA: Insufficient documentation

## 2012-06-26 DIAGNOSIS — F0280 Dementia in other diseases classified elsewhere without behavioral disturbance: Secondary | ICD-10-CM | POA: Insufficient documentation

## 2012-06-26 DIAGNOSIS — I4891 Unspecified atrial fibrillation: Secondary | ICD-10-CM | POA: Insufficient documentation

## 2012-06-26 DIAGNOSIS — Z7982 Long term (current) use of aspirin: Secondary | ICD-10-CM | POA: Insufficient documentation

## 2012-06-26 DIAGNOSIS — Z87440 Personal history of urinary (tract) infections: Secondary | ICD-10-CM | POA: Insufficient documentation

## 2012-06-26 DIAGNOSIS — Z79899 Other long term (current) drug therapy: Secondary | ICD-10-CM | POA: Insufficient documentation

## 2012-06-26 DIAGNOSIS — E860 Dehydration: Secondary | ICD-10-CM | POA: Insufficient documentation

## 2012-06-26 DIAGNOSIS — I10 Essential (primary) hypertension: Secondary | ICD-10-CM | POA: Insufficient documentation

## 2012-06-26 LAB — COMPREHENSIVE METABOLIC PANEL
ALT: 33 U/L (ref 0–35)
AST: 44 U/L — ABNORMAL HIGH (ref 0–37)
Alkaline Phosphatase: 78 U/L (ref 39–117)
CO2: 19 mEq/L (ref 19–32)
Calcium: 8.6 mg/dL (ref 8.4–10.5)
Chloride: 106 mEq/L (ref 96–112)
GFR calc Af Amer: 54 mL/min — ABNORMAL LOW (ref >90)
GFR calc non Af Amer: 47 mL/min — ABNORMAL LOW (ref >90)
Glucose, Bld: 119 mg/dL — ABNORMAL HIGH (ref 70–99)
Sodium: 138 mEq/L (ref 135–145)
Total Bilirubin: 0.3 mg/dL (ref 0.3–1.2)

## 2012-06-26 LAB — URINALYSIS, ROUTINE W REFLEX MICROSCOPIC
Nitrite: NEGATIVE
Specific Gravity, Urine: 1.014 (ref 1.005–1.030)
Urobilinogen, UA: 0.2 mg/dL (ref 0.0–1.0)

## 2012-06-26 LAB — TROPONIN I: Troponin I: <0.3 ng/mL (ref <0.30)

## 2012-06-26 LAB — BASIC METABOLIC PANEL
CO2: 14 mEq/L — ABNORMAL LOW (ref 19–32)
Calcium: 9.1 mg/dL (ref 8.4–10.5)
Chloride: 103 mEq/L (ref 96–112)
GFR calc Af Amer: 61 mL/min — ABNORMAL LOW (ref >90)
GFR calc non Af Amer: 53 mL/min — ABNORMAL LOW (ref >90)
Potassium: 4.5 mEq/L (ref 3.5–5.1)
Potassium: 4 mEq/L (ref 3.5–5.1)
Sodium: 137 mEq/L (ref 135–145)
Sodium: 142 mEq/L (ref 135–145)

## 2012-06-26 LAB — CBC
Hemoglobin: 12 g/dL (ref 12.0–15.0)
Platelets: 167 10*3/uL (ref 150–400)
RBC: 3.91 MIL/uL (ref 3.87–5.11)
WBC: 7.9 10*3/uL (ref 4.0–10.5)

## 2012-06-26 MED ORDER — SODIUM CHLORIDE 0.9 % IV BOLUS (SEPSIS)
1000.0000 mL | Freq: Once | INTRAVENOUS | Status: AC
  Administered 2012-06-26: 1000 mL via INTRAVENOUS

## 2012-06-26 MED ORDER — SODIUM CHLORIDE 0.9 % IV BOLUS (SEPSIS)
500.0000 mL | Freq: Once | INTRAVENOUS | Status: AC
  Administered 2012-06-26: 500 mL via INTRAVENOUS

## 2012-06-26 NOTE — ED Notes (Signed)
Pt daughter [name] called and informed that pt will be discharged back to facility.

## 2012-06-26 NOTE — ED Notes (Signed)
Spoke with mini lab & phlebotomy re: CG4 results, awaiting results

## 2012-06-26 NOTE — ED Provider Notes (Signed)
History     CSN: [id number]  Arrival date & time 06/26/12  1336   First MD Initiated Contact with Patient 06/26/12 1505      Chief Complaint  Patient presents with  . Near Syncope    (Consider location/radiation/quality/duration/timing/severity/associated sxs/prior treatment) The history is provided by a relative.   patient here after having a period of decreased responsiveness today while at lunch. She does have a history of Alzheimer's and is discharged the hospital yesterday after an admission for septic joint. No reported history of fevers. No vomiting or diarrhea. No seizure activity noted. Symptoms began and resolved spontaneously. According to her daughter, she is at her baseline currently. States patient a very large bowel movement and her daughter's concern at that might have been a factor in today's events. There is no urinary incontinence noted. No postictal period. The episode lasted for less than a minute. She was noted to have been hypotensive at the scene but that has resolved.  Past Medical History  Diagnosis Date  . Atrial fibrillation   . Unspecified essential hypertension   . Alzheimer disease   . UTI (urinary tract infection)     Past Surgical History  Procedure Laterality Date  . Bladder suspension  1953  . Cesarean section  1961  . Abdominal hysterectomy  1964  . Bladder surgery  1977  . Cataract extraction  2003    left eye  . Irrigation and debridement abscess Left 06/21/2012    Procedure: irrigation and debridement left wrist;  Surgeon: Johnette Abraham, MD;  Location: [facility];  Service: Plastics;  Laterality: Left;  left wrist    Family History  Problem Relation Age of Onset  . Other Mother     lung and heart problems  . Other Father     pace maker, heart problems  . Hypertension Brother   . Other Brother     CABG x3  . Hypertension Sister     History  Substance Use Topics  . Smoking status: Never Smoker   . Smokeless tobacco: Never Used  .  Alcohol Use: No    OB History   Grav Para Term Preterm Abortions TAB SAB Ect Mult Living                  Review of Systems  All other systems reviewed and are negative.    Allergies  Amoxicillin-pot clavulanate; Codeine; Lidocaine; Nitrofurantoin; and Banana flavor  Home Medications   Current Outpatient Rx  Name  Route  Sig  Dispense  Refill  . acetaminophen (TYLENOL) 325 MG tablet   Oral   Take 650 mg by mouth every 4 (four) hours as needed (headache).         Marland Kitchen aspirin 81 MG chewable tablet   Oral   Chew 81 mg by mouth daily.         . benazepril (LOTENSIN) 40 MG tablet   Oral   Take 40 mg by mouth daily.         . bisacodyl (DULCOLAX) 10 MG suppository   Rectal   Place 10 mg rectally daily as needed for constipation.         Marland Kitchen doxycycline (VIBRAMYCIN) 100 MG capsule   Oral   Take 200 mg by mouth 2 (two) times daily. 7 day course. Started on 06/26/2012         . feeding supplement (ENSURE IMMUNE HEALTH) LIQD   Oral   Take 237 mLs by mouth 2 (two)  times daily. Vanilla flavor          . guaiFENesin-dextromethorphan (ROBITUSSIN DM) 100-10 MG/5ML syrup   Oral   Take 10 mLs by mouth 3 (three) times daily as needed for cough.         . isosorbide mononitrate (IMDUR) 30 MG 24 hr tablet   Oral   Take 15 mg by mouth daily.         Marland Kitchen levothyroxine (SYNTHROID, LEVOTHROID) 75 MCG tablet   Oral   Take 75 mcg by mouth daily.          . metoprolol succinate (TOPROL-XL) 100 MG 24 hr tablet   Oral   Take 100 mg by mouth daily. Take with or immediately following a meal.         . ondansetron (ZOFRAN) 4 MG tablet   Oral   Take 4 mg by mouth every 8 (eight) hours as needed for nausea.         . traMADol (ULTRAM) 50 MG tablet   Oral   Take 1 tablet (50 mg total) by mouth every 6 (six) hours as needed for pain.   45 tablet   0     BP 178/75  Pulse 68  Temp(Src) 95.6 F (35.3 C) (Rectal)  Resp 20  SpO2 99%  Physical Exam  Nursing note  and vitals reviewed. Constitutional: She appears well-developed and well-nourished.  Non-toxic appearance. No distress.  HENT:  Head: Normocephalic and atraumatic.  Eyes: Conjunctivae, EOM and lids are normal. Pupils are equal, round, and reactive to light.  Neck: Normal range of motion. Neck supple. No tracheal deviation present. No mass present.  Cardiovascular: Normal rate, regular rhythm and normal heart sounds.  Exam reveals no gallop.   No murmur heard. Pulmonary/Chest: Effort normal and breath sounds normal. No stridor. No respiratory distress. She has no decreased breath sounds. She has no wheezes. She has no rhonchi. She has no rales.  Abdominal: Soft. Normal appearance and bowel sounds are normal. She exhibits no distension. There is no tenderness. There is no rebound and no CVA tenderness.  Musculoskeletal: Normal range of motion. She exhibits no edema and no tenderness.  Neurological: She is alert. She has normal strength. No cranial nerve deficit or sensory deficit. GCS eye subscore is 4. GCS verbal subscore is 5. GCS motor subscore is 6.  Skin: Skin is warm and dry. No abrasion and no rash noted.  Psychiatric: Her affect is blunt. Her speech is delayed. She is slowed.    ED Course  Procedures (including critical care time)  Labs Reviewed  CBC - Abnormal; Notable for the following:    HCT 35.1 (*)    All other components within normal limits  BASIC METABOLIC PANEL  TROPONIN I  COMPREHENSIVE METABOLIC PANEL   No results found.   No diagnosis found.    MDM  Patient given IV fluids here for suspected dehydration as evidenced by her mild acidosis which is resolved. Hypothermia has resolved here. Patient reassessed multiple times and I have spoken with her daughter at length and she states that she is at her baseline. Will be discharged home  No evidence of sepsis at this time.        Toy Baker, MD 06/26/12 [phone]

## 2012-06-26 NOTE — ED Notes (Signed)
Did in and out cath on patient dark urine in return 

## 2012-06-26 NOTE — ED Notes (Signed)
Phlebotomy notified for BMP redraw post NS infusion

## 2012-06-26 NOTE — ED Notes (Signed)
Freida Busman, MD notified re: rectal temp., instructed to start bear hugger to increase body temp

## 2012-06-26 NOTE — ED Notes (Signed)
Did cbg on patient it was 61 notified Toniann Fail of blood sugar

## 2012-06-26 NOTE — ED Notes (Signed)
Kohut, MD notified re: rectal temp, warm blankets placed on pt

## 2012-06-26 NOTE — ED Notes (Signed)
Freida Busman, MD notified re: Kaitlyn Lucero results

## 2012-06-26 NOTE — ED Notes (Signed)
Onset today near syncope event witnessed while patient sitting down.  At a restaurant with group from nursing home. EMS established IV Left AC 18 given 0.9 NS bolus. BP upright 78/40 and supine 96/60. CBG 144 and Heart rate increased from 50 to 60. Alert to patient normal.

## 2012-06-26 NOTE — ED Notes (Signed)
Checked patient cbg it was 65 notified Theatre stage manager

## 2012-07-02 ENCOUNTER — Inpatient Hospital Stay (HOSPITAL_COMMUNITY)
Admission: EM | Admit: 2012-07-02 | Discharge: 2012-07-08 | DRG: 603 | Disposition: A | Discharge status: Nursing Home (Includes ICF) | Payer: Medicare Other | Attending: Family Medicine | Admitting: Family Medicine

## 2012-07-02 ENCOUNTER — Emergency Department (HOSPITAL_COMMUNITY): Payer: Medicare Other

## 2012-07-02 ENCOUNTER — Encounter (HOSPITAL_COMMUNITY): Payer: Self-pay | Admitting: Emergency Medicine

## 2012-07-02 DIAGNOSIS — F0280 Dementia in other diseases classified elsewhere without behavioral disturbance: Secondary | ICD-10-CM | POA: Diagnosis present

## 2012-07-02 DIAGNOSIS — L03114 Cellulitis of left upper limb: Secondary | ICD-10-CM

## 2012-07-02 DIAGNOSIS — E039 Hypothyroidism, unspecified: Secondary | ICD-10-CM | POA: Diagnosis present

## 2012-07-02 DIAGNOSIS — M009 Pyogenic arthritis, unspecified: Secondary | ICD-10-CM | POA: Diagnosis present

## 2012-07-02 DIAGNOSIS — I498 Other specified cardiac arrhythmias: Secondary | ICD-10-CM | POA: Diagnosis present

## 2012-07-02 DIAGNOSIS — I10 Essential (primary) hypertension: Secondary | ICD-10-CM | POA: Diagnosis present

## 2012-07-02 DIAGNOSIS — G9340 Encephalopathy, unspecified: Secondary | ICD-10-CM

## 2012-07-02 DIAGNOSIS — I480 Paroxysmal atrial fibrillation: Secondary | ICD-10-CM | POA: Diagnosis present

## 2012-07-02 DIAGNOSIS — M25539 Pain in unspecified wrist: Secondary | ICD-10-CM | POA: Diagnosis present

## 2012-07-02 DIAGNOSIS — G309 Alzheimer's disease, unspecified: Secondary | ICD-10-CM | POA: Diagnosis present

## 2012-07-02 DIAGNOSIS — Z7982 Long term (current) use of aspirin: Secondary | ICD-10-CM

## 2012-07-02 DIAGNOSIS — Z79899 Other long term (current) drug therapy: Secondary | ICD-10-CM

## 2012-07-02 DIAGNOSIS — L03119 Cellulitis of unspecified part of limb: Secondary | ICD-10-CM

## 2012-07-02 DIAGNOSIS — N390 Urinary tract infection, site not specified: Secondary | ICD-10-CM

## 2012-07-02 DIAGNOSIS — R5081 Fever presenting with conditions classified elsewhere: Secondary | ICD-10-CM

## 2012-07-02 DIAGNOSIS — I4891 Unspecified atrial fibrillation: Secondary | ICD-10-CM | POA: Diagnosis present

## 2012-07-02 DIAGNOSIS — A498 Other bacterial infections of unspecified site: Secondary | ICD-10-CM | POA: Diagnosis present

## 2012-07-02 DIAGNOSIS — Z66 Do not resuscitate: Secondary | ICD-10-CM | POA: Diagnosis present

## 2012-07-02 DIAGNOSIS — L02519 Cutaneous abscess of unspecified hand: Principal | ICD-10-CM | POA: Diagnosis present

## 2012-07-02 HISTORY — DX: Pyogenic arthritis, unspecified: M00.9

## 2012-07-02 LAB — BASIC METABOLIC PANEL
BUN: 21 mg/dL (ref 6–23)
GFR calc Af Amer: 45 mL/min — ABNORMAL LOW (ref >90)
GFR calc non Af Amer: 39 mL/min — ABNORMAL LOW (ref >90)
Potassium: 4.4 mEq/L (ref 3.5–5.1)
Sodium: 134 mEq/L — ABNORMAL LOW (ref 135–145)

## 2012-07-02 LAB — CBC WITH DIFFERENTIAL/PLATELET
Basophils Absolute: 0 10*3/uL (ref 0.0–0.1)
Basophils Relative: 0 % (ref 0–1)
Hemoglobin: 13.1 g/dL (ref 12.0–15.0)
MCHC: 34.5 g/dL (ref 30.0–36.0)
Monocytes Relative: 10 % (ref 3–12)
Neutro Abs: 10.5 10*3/uL — ABNORMAL HIGH (ref 1.7–7.7)
Neutrophils Relative %: 78 % — ABNORMAL HIGH (ref 43–77)
Platelets: 207 10*3/uL (ref 150–400)
RBC: 4.24 MIL/uL (ref 3.87–5.11)

## 2012-07-02 LAB — URINALYSIS, ROUTINE W REFLEX MICROSCOPIC
Hgb urine dipstick: NEGATIVE
Nitrite: POSITIVE — AB
Specific Gravity, Urine: 1.017 (ref 1.005–1.030)
Urobilinogen, UA: 1 mg/dL (ref 0.0–1.0)

## 2012-07-02 LAB — URINE MICROSCOPIC-ADD ON

## 2012-07-02 LAB — MRSA PCR SCREENING: MRSA by PCR: NEGATIVE

## 2012-07-02 MED ORDER — HEPARIN SODIUM (PORCINE) 5000 UNIT/ML IJ SOLN
5000.0000 [IU] | Freq: Three times a day (TID) | INTRAMUSCULAR | Status: DC
  Administered 2012-07-02 – 2012-07-08 (×18): 5000 [IU] via SUBCUTANEOUS
  Filled 2012-07-02 (×21): qty 1

## 2012-07-02 MED ORDER — HYDROMORPHONE HCL PF 1 MG/ML IJ SOLN
0.5000 mg | INTRAMUSCULAR | Status: DC | PRN
  Administered 2012-07-02 – 2012-07-08 (×9): 0.5 mg via INTRAVENOUS
  Filled 2012-07-02 (×9): qty 1

## 2012-07-02 MED ORDER — ACETAMINOPHEN 650 MG RE SUPP: 650.0000 mg | Freq: Four times a day (QID) | RECTAL | Status: DC | PRN

## 2012-07-02 MED ORDER — ASPIRIN 81 MG PO CHEW
81.0000 mg | CHEWABLE_TABLET | Freq: Every day | ORAL | Status: DC
  Administered 2012-07-03 – 2012-07-08 (×6): 81 mg via ORAL
  Filled 2012-07-02 (×6): qty 1

## 2012-07-02 MED ORDER — SODIUM CHLORIDE 0.9 % IJ SOLN: 3.0000 mL | Freq: Two times a day (BID) | INTRAMUSCULAR | Status: DC

## 2012-07-02 MED ORDER — BENAZEPRIL HCL 40 MG PO TABS
40.0000 mg | ORAL_TABLET | Freq: Every day | ORAL | Status: DC
  Administered 2012-07-03 – 2012-07-08 (×6): 40 mg via ORAL
  Filled 2012-07-02 (×6): qty 1

## 2012-07-02 MED ORDER — IMIPENEM-CILASTATIN 250 MG IV SOLR
250.0000 mg | Freq: Four times a day (QID) | INTRAVENOUS | Status: DC
  Administered 2012-07-02 – 2012-07-03 (×2): 250 mg via INTRAVENOUS
  Filled 2012-07-02 (×7): qty 250

## 2012-07-02 MED ORDER — ACETAMINOPHEN 325 MG PO TABS
650.0000 mg | ORAL_TABLET | Freq: Four times a day (QID) | ORAL | Status: DC | PRN
  Administered 2012-07-03: 650 mg via ORAL
  Filled 2012-07-02: qty 2

## 2012-07-02 MED ORDER — ENSURE IMMUNE HEALTH PO LIQD: 237.0000 mL | Freq: Two times a day (BID) | ORAL | Status: DC

## 2012-07-02 MED ORDER — BISACODYL 10 MG RE SUPP
10.0000 mg | Freq: Every day | RECTAL | Status: DC | PRN
  Administered 2012-07-05: 10 mg via RECTAL
  Filled 2012-07-02: qty 1

## 2012-07-02 MED ORDER — ONDANSETRON HCL 4 MG PO TABS
4.0000 mg | ORAL_TABLET | Freq: Four times a day (QID) | ORAL | Status: DC | PRN
  Filled 2012-07-02: qty 1

## 2012-07-02 MED ORDER — VANCOMYCIN HCL IN DEXTROSE 1-5 GM/200ML-% IV SOLN
1000.0000 mg | INTRAVENOUS | Status: DC
  Administered 2012-07-02: 1000 mg via INTRAVENOUS
  Filled 2012-07-02 (×2): qty 200

## 2012-07-02 MED ORDER — METOPROLOL SUCCINATE ER 50 MG PO TB24
50.0000 mg | ORAL_TABLET | Freq: Every morning | ORAL | Status: DC
  Administered 2012-07-03 – 2012-07-04 (×2): 50 mg via ORAL
  Filled 2012-07-02 (×2): qty 1

## 2012-07-02 MED ORDER — SODIUM CHLORIDE 0.9 % IV SOLN: 250.0000 mL | INTRAVENOUS | Status: DC | PRN

## 2012-07-02 MED ORDER — ISOSORBIDE MONONITRATE 15 MG HALF TABLET
15.0000 mg | ORAL_TABLET | Freq: Every day | ORAL | Status: DC
  Administered 2012-07-03 – 2012-07-08 (×6): 15 mg via ORAL
  Filled 2012-07-02 (×6): qty 1

## 2012-07-02 MED ORDER — SODIUM CHLORIDE 0.9 % IJ SOLN: 3.0000 mL | INTRAMUSCULAR | Status: DC | PRN

## 2012-07-02 MED ORDER — ENSURE COMPLETE PO LIQD
237.0000 mL | Freq: Two times a day (BID) | ORAL | Status: DC
  Administered 2012-07-03 – 2012-07-08 (×10): 237 mL via ORAL

## 2012-07-02 MED ORDER — METOPROLOL SUCCINATE ER 100 MG PO TB24: 100.0000 mg | ORAL_TABLET | Freq: Every morning | ORAL | Status: DC

## 2012-07-02 MED ORDER — LEVOTHYROXINE SODIUM 75 MCG PO TABS
75.0000 ug | ORAL_TABLET | Freq: Every day | ORAL | Status: DC
  Administered 2012-07-03 – 2012-07-08 (×6): 75 ug via ORAL
  Filled 2012-07-02 (×7): qty 1

## 2012-07-02 MED ORDER — ONDANSETRON HCL 4 MG/2ML IJ SOLN: 4.0000 mg | Freq: Four times a day (QID) | INTRAMUSCULAR | Status: DC | PRN

## 2012-07-02 MED ORDER — LIDOCAINE HCL 1 % IJ SOLN
INTRAMUSCULAR | Status: AC
  Filled 2012-07-02: qty 20

## 2012-07-02 MED ORDER — SODIUM CHLORIDE 0.9 % IV SOLN
INTRAVENOUS | Status: DC
  Administered 2012-07-02 – 2012-07-05 (×4): via INTRAVENOUS

## 2012-07-02 MED ORDER — TRAMADOL HCL 50 MG PO TABS
50.0000 mg | ORAL_TABLET | Freq: Four times a day (QID) | ORAL | Status: DC | PRN
  Administered 2012-07-03 – 2012-07-04 (×4): 50 mg via ORAL
  Filled 2012-07-02 (×4): qty 1

## 2012-07-02 NOTE — ED Notes (Signed)
Patient transported to X-ray 

## 2012-07-02 NOTE — ED Provider Notes (Signed)
History     CSN: [id number]  Arrival date & time 07/02/12  1250   First MD Initiated Contact with Patient 07/02/12 1338      Chief Complaint  Patient presents with  . Hand Swelling      The history is provided by the EMS personnel, the nursing home and a relative. The history is limited by the condition of the patient (Hx dementia).  Pt was seen at 1340.  Per EMS, NH report and pt's family, c/o gradual onset and persistence of constant left hand and wrist "pain" and "swelling" since this morning.  Pt's family states she was admitted approx 2 weeks ago for same, and taken to the OR for concern over septic joint.  Pt's family states left hand and wrist redness and swelling were improving after she was discharged from the hospital and was taking doxycycline. Completed the course of abx several days ago.  Family states they saw pt 4 days ago and her hand/wrist "looked ok."  No reported fevers, no reported injury. Pt has significant hx of dementia.     Past Medical History  Diagnosis Date  . Atrial fibrillation   . Unspecified essential hypertension   . Alzheimer disease   . UTI (urinary tract infection)   . Septic arthritis of wrist, left 06/2012    Past Surgical History  Procedure Laterality Date  . Bladder suspension  1953  . Cesarean section  1961  . Abdominal hysterectomy  1964  . Bladder surgery  1977  . Cataract extraction  2003    left eye  . Irrigation and debridement abscess Left 06/21/2012    Procedure: irrigation and debridement left wrist;  Surgeon: Johnette Abraham, MD;  Location: [facility];  Service: Plastics;  Laterality: Left;  left wrist    Family History  Problem Relation Age of Onset  . Other Mother     lung and heart problems  . Other Father     pace maker, heart problems  . Hypertension Brother   . Other Brother     CABG x3  . Hypertension Sister     History  Substance Use Topics  . Smoking status: Never Smoker   . Smokeless tobacco: Never Used  .  Alcohol Use: No      Review of Systems  Unable to perform ROS: Dementia    Allergies  Amoxicillin-pot clavulanate; Nitrofurantoin; Banana flavor; Codeine; and Lidocaine  Home Medications   Current Outpatient Rx  Name  Route  Sig  Dispense  Refill  . acetaminophen (TYLENOL) 325 MG tablet   Oral   Take 650 mg by mouth every 4 (four) hours as needed (headache).         Marland Kitchen aspirin 81 MG chewable tablet   Oral   Chew 81 mg by mouth daily.         . benazepril (LOTENSIN) 40 MG tablet   Oral   Take 40 mg by mouth daily.         . feeding supplement (ENSURE IMMUNE HEALTH) LIQD   Oral   Take 237 mLs by mouth 2 (two) times daily. Vanilla flavor          . guaiFENesin-dextromethorphan (ROBITUSSIN DM) 100-10 MG/5ML syrup   Oral   Take 10 mLs by mouth 3 (three) times daily as needed for cough.         . isosorbide mononitrate (IMDUR) 30 MG 24 hr tablet   Oral   Take 15 mg by mouth daily.         Marland Kitchen  levothyroxine (SYNTHROID, LEVOTHROID) 75 MCG tablet   Oral   Take 75 mcg by mouth daily before breakfast.          . metoprolol succinate (TOPROL-XL) 100 MG 24 hr tablet   Oral   Take 100 mg by mouth every morning. Take with or immediately following a meal.         . ondansetron (ZOFRAN) 4 MG tablet   Oral   Take 4 mg by mouth every 8 (eight) hours as needed for nausea.         . traMADol (ULTRAM) 50 MG tablet   Oral   Take 1 tablet (50 mg total) by mouth every 6 (six) hours as needed for pain.   45 tablet   0   . bisacodyl (DULCOLAX) 10 MG suppository   Rectal   Place 10 mg rectally daily as needed for constipation.           BP 126/53  Pulse 56  Temp(Src) 97.5 F (36.4 C) (Oral)  Resp 18  SpO2 96%  Physical Exam 1345: Physical examination:  Nursing notes reviewed; Vital signs and O2 SAT reviewed;  Constitutional: Thin, frail, In no acute distress; Head:  Normocephalic, atraumatic; Eyes: EOMI, PERRL, No scleral icterus; ENMT: Mouth and pharynx  normal, Mucous membranes dry; Neck: Supple, Full range of motion, No lymphadenopathy; Cardiovascular: Irregular irregular rate and rhythm, No gallop; Respiratory: Breath sounds clear & equal bilaterally, No wheezes.  Speaking full sentences with ease, Normal respiratory effort/excursion; Chest: Nontender, Movement normal; Abdomen: Soft, Nontender, Nondistended, Normal bowel sounds; Genitourinary: No CVA tenderness; Extremities: Pulses normal, No deformity. +TTP left dorsal hand and wrist with warmth, erythema and edema from left fingers to just proximal to left wrist. No streaking up arm. No open wounds, no ecchymosis.; Neuro: Awake, alert, confused re: time, place, events per baseline dementia.  Speech clear. Moves all ext on stretcher spontaneously without apparent gross focal motor deficits.; Skin: Color normal, Warm, Dry.    ED Course  Procedures     MDM  MDM Reviewed: previous chart, nursing note and vitals Reviewed previous: labs Interpretation: labs and x-ray   Results for orders placed during the hospital encounter of 07/02/12  BASIC METABOLIC PANEL      Result Value Range   Sodium 134 (*) 135 - 145 mEq/L   Potassium 4.4  3.5 - 5.1 mEq/L   Chloride 98  96 - 112 mEq/L   CO2 24  19 - 32 mEq/L   Glucose, Bld 132 (*) 70 - 99 mg/dL   BUN 21  6 - 23 mg/dL   Creatinine, Ser 8.65 (*) 0.50 - 1.10 mg/dL   Calcium 9.3  8.4 - 78.4 mg/dL   GFR calc non Af Amer 39 (*) >90 mL/min   GFR calc Af Amer 45 (*) >90 mL/min  CBC WITH DIFFERENTIAL      Result Value Range   WBC 13.5 (*) 4.0 - 10.5 K/uL   RBC 4.24  3.87 - 5.11 MIL/uL   Hemoglobin 13.1  12.0 - 15.0 g/dL   HCT 69.6  29.5 - 28.4 %   MCV 89.6  78.0 - 100.0 fL   MCH 30.9  26.0 - 34.0 pg   MCHC 34.5  30.0 - 36.0 g/dL   RDW 13.2  44.0 - 10.2 %   Platelets 207  150 - 400 K/uL   Neutrophils Relative % 78 (*) 43 - 77 %   Neutro Abs 10.5 (*) 1.7 - 7.7 K/uL  Lymphocytes Relative 12  12 - 46 %   Lymphs Abs 1.7  0.7 - 4.0 K/uL    Monocytes Relative 10  3 - 12 %   Monocytes Absolute 1.3 (*) 0.1 - 1.0 K/uL   Eosinophils Relative 0  0 - 5 %   Eosinophils Absolute 0.0  0.0 - 0.7 K/uL   Basophils Relative 0  0 - 1 %   Basophils Absolute 0.0  0.0 - 0.1 K/uL  URINALYSIS, ROUTINE W REFLEX MICROSCOPIC      Result Value Range   Color, Urine YELLOW  YELLOW   APPearance CLOUDY (*) CLEAR   Specific Gravity, Urine 1.017  1.005 - 1.030   pH 6.5  5.0 - 8.0   Glucose, UA NEGATIVE  NEGATIVE mg/dL   Hgb urine dipstick NEGATIVE  NEGATIVE   Bilirubin Urine NEGATIVE  NEGATIVE   Ketones, ur NEGATIVE  NEGATIVE mg/dL   Protein, ur 30 (*) NEGATIVE mg/dL   Urobilinogen, UA 1.0  0.0 - 1.0 mg/dL   Nitrite POSITIVE (*) NEGATIVE   Leukocytes, UA MODERATE (*) NEGATIVE  LACTIC ACID, PLASMA      Result Value Range   Lactic Acid, Venous 2.3 (*) 0.5 - 2.2 mmol/L  URINE MICROSCOPIC-ADD ON      Result Value Range   WBC, UA 11-20  <3 WBC/hpf   Bacteria, UA MANY (*) RARE   Dg Wrist Complete Left 07/02/2012   *RADIOLOGY REPORT*  Clinical Data: Pain, redness, swelling, heat throughout the hand and wrist.  History of septic arthritis.  LEFT WRIST - COMPLETE 3+ VIEW  Comparison: 06/20/2012  Findings: Bones appear radiolucent. There are marked degenerate changes throughout the carpals, especially involving the first carpometacarpal joint.  No evidence for acute fracture or subluxation.  IMPRESSION: Stable appearance of the wrist.   Original Report Authenticated By: Norva Pavlov, M.D.    Dg Hand Complete Left 07/02/2012   *RADIOLOGY REPORT*  Clinical Data: Pain, swelling, redness, heat in the hand and wrist. History of septic arthritis.  LEFT HAND - COMPLETE 3+ VIEW  Comparison: 06/20/2012  Findings: There are marked degenerative changes in the interphalangeal joints and wrist.  No evidence for acute fracture or subluxation.  No radiopaque foreign body or soft tissue gas.  IMPRESSION:  1.  Significant degenerative changes. 2. No evidence for acute   abnormality.   Original Report Authenticated By: Norva Pavlov, M.D.     [id number]:  Will dose IV vancomycin for cellulitis of left hand/wrist. +UTI, UC pending. Dx and testing d/w pt's family.  Questions answered.  Verb understanding, agreeable to admit.  T/C to Hand Surgeon Dr. Izora Ribas, case discussed, including:  HPI, pertinent PM/SHx, VS/PE, dx testing, ED course and treatment:  Agreeable to consult, requests to admit to hospitalist service, he will see pt later on today. T/C to [facility] Dr. Brien Few, case discussed, including:  HPI, pertinent PM/SHx, VS/PE, dx testing, ED course and treatment:  Agreeable to admit, requests to write temporary orders, obtain medical bed to team 3.           Laray Anger, DO 07/04/12 1506

## 2012-07-02 NOTE — Consult Note (Signed)
Reason for Consult:L ahnd swelling Referring Physician: Hospitalist  Kaitlyn Lucero is an 79 y.o. right handed female.  HPI: Pt known to me from ~10 days ago, re-admitted with recurrent swelling of L hand, wrist swelling.  Please see notes from previous admission, wrist aspirated and washed out in or, negative fluid cultures, had been on Doxycycline po after discharge until recently. Daughter states wrist was getting better.  Currently pt less alert than previously.  Unable to verbally express herself.  Past Medical History  Diagnosis Date  . Atrial fibrillation   . Unspecified essential hypertension   . Alzheimer disease   . UTI (urinary tract infection)   . Septic arthritis of wrist, left 06/2012    Past Surgical History  Procedure Laterality Date  . Bladder suspension  1953  . Cesarean section  1961  . Abdominal hysterectomy  1964  . Bladder surgery  1977  . Cataract extraction  2003    left eye  . Irrigation and debridement abscess Left 06/21/2012    Procedure: irrigation and debridement left wrist;  Surgeon: Johnette Abraham, MD;  Location: [facility];  Service: Plastics;  Laterality: Left;  left wrist    Family History  Problem Relation Age of Onset  . Other Mother     lung and heart problems  . Other Father     pace maker, heart problems  . Hypertension Brother   . Other Brother     CABG x3  . Hypertension Sister     Social History:  reports that she has never smoked. She has never used smokeless tobacco. She reports that she does not drink alcohol or use illicit drugs.  Allergies:  Allergies  Allergen Reactions  . Amoxicillin-Pot Clavulanate Diarrhea  . Nitrofurantoin Diarrhea    Family and Per nursing home mar  . Banana Flavor Rash  . Codeine Palpitations    Family states not a true allergy, Per nursing home mar  . Lidocaine Palpitations    Family states not a true allergy, Per nursing home mar    Medications: I have reviewed the patient's current  medications.  Results for orders placed during the hospital encounter of 07/02/12 (from the past 48 hour(s))  URINALYSIS, ROUTINE W REFLEX MICROSCOPIC     Status: Abnormal   Collection Time    07/02/12  2:43 PM      Result Value Range   Color, Urine YELLOW  YELLOW   APPearance CLOUDY (*) CLEAR   Specific Gravity, Urine 1.017  1.005 - 1.030   pH 6.5  5.0 - 8.0   Glucose, UA NEGATIVE  NEGATIVE mg/dL   Hgb urine dipstick NEGATIVE  NEGATIVE   Bilirubin Urine NEGATIVE  NEGATIVE   Ketones, ur NEGATIVE  NEGATIVE mg/dL   Protein, ur 30 (*) NEGATIVE mg/dL   Urobilinogen, UA 1.0  0.0 - 1.0 mg/dL   Nitrite POSITIVE (*) NEGATIVE   Leukocytes, UA MODERATE (*) NEGATIVE  URINE MICROSCOPIC-ADD ON     Status: Abnormal   Collection Time    07/02/12  2:43 PM      Result Value Range   WBC, UA 11-20  <3 WBC/hpf   Comment: WITH CLUMPS   Bacteria, UA MANY (*) RARE  BASIC METABOLIC PANEL     Status: Abnormal   Collection Time    07/02/12  2:59 PM      Result Value Range   Sodium 134 (*) 135 - 145 mEq/L   Potassium 4.4  3.5 - 5.1 mEq/L  Chloride 98  96 - 112 mEq/L   CO2 24  19 - 32 mEq/L   Glucose, Bld 132 (*) 70 - 99 mg/dL   BUN 21  6 - 23 mg/dL   Creatinine, Ser 1.61 (*) 0.50 - 1.10 mg/dL   Calcium 9.3  8.4 - 09.6 mg/dL   GFR calc non Af Amer 39 (*) >90 mL/min   GFR calc Af Amer 45 (*) >90 mL/min   Comment:            The eGFR has been calculated     using the CKD EPI equation.     This calculation has not been     validated in all clinical     situations.     eGFR's persistently     <90 mL/min signify     possible Chronic Kidney Disease.  CBC WITH DIFFERENTIAL     Status: Abnormal   Collection Time    07/02/12  2:59 PM      Result Value Range   WBC 13.5 (*) 4.0 - 10.5 K/uL   RBC 4.24  3.87 - 5.11 MIL/uL   Hemoglobin 13.1  12.0 - 15.0 g/dL   HCT 04.5  40.9 - 81.1 %   MCV 89.6  78.0 - 100.0 fL   MCH 30.9  26.0 - 34.0 pg   MCHC 34.5  30.0 - 36.0 g/dL   RDW 91.4  78.2 - 95.6 %    Platelets 207  150 - 400 K/uL   Neutrophils Relative % 78 (*) 43 - 77 %   Neutro Abs 10.5 (*) 1.7 - 7.7 K/uL   Lymphocytes Relative 12  12 - 46 %   Lymphs Abs 1.7  0.7 - 4.0 K/uL   Monocytes Relative 10  3 - 12 %   Monocytes Absolute 1.3 (*) 0.1 - 1.0 K/uL   Eosinophils Relative 0  0 - 5 %   Eosinophils Absolute 0.0  0.0 - 0.7 K/uL   Basophils Relative 0  0 - 1 %   Basophils Absolute 0.0  0.0 - 0.1 K/uL  LACTIC ACID, PLASMA     Status: Abnormal   Collection Time    07/02/12  2:59 PM      Result Value Range   Lactic Acid, Venous 2.3 (*) 0.5 - 2.2 mmol/L    Dg Wrist Complete Left  07/02/2012   *RADIOLOGY REPORT*  Clinical Data: Pain, redness, swelling, heat throughout the hand and wrist.  History of septic arthritis.  LEFT WRIST - COMPLETE 3+ VIEW  Comparison: 06/20/2012  Findings: Bones appear radiolucent. There are marked degenerate changes throughout the carpals, especially involving the first carpometacarpal joint.  No evidence for acute fracture or subluxation.  IMPRESSION: Stable appearance of the wrist.   Original Report Authenticated By: Norva Pavlov, M.D.   Dg Hand Complete Left  07/02/2012   *RADIOLOGY REPORT*  Clinical Data: Pain, swelling, redness, heat in the hand and wrist. History of septic arthritis.  LEFT HAND - COMPLETE 3+ VIEW  Comparison: 06/20/2012  Findings: There are marked degenerative changes in the interphalangeal joints and wrist.  No evidence for acute fracture or subluxation.  No radiopaque foreign body or soft tissue gas.  IMPRESSION:  1.  Significant degenerative changes. 2. No evidence for acute  abnormality.   Original Report Authenticated By: Norva Pavlov, M.D.    Pertinent items are noted in HPI. Temp:  [97.5 F (36.4 C)] 97.5 F (36.4 C) (05/25 1653) Pulse Rate:  [55-56] 55 (05/25  1653) Resp:  [15-18] 15 (05/25 1653) BP: (126-139)/(53-55) 139/55 mmHg (05/25 1653) SpO2:  [96 %-97 %] 97 % (05/25 1653) General appearance: resting quitely in bed,  not oriented RUE: evidence of arthritis, no erythema; LUE: mild dorsal hand, wrist swelling / erythema, hurts pt to move wrist   Assessment/Plan: Recurrent L wrist swelling/erythema/pain - inflammatory vs infective UTI Multiple Medical Problems Plan: Wrist aspirated at bedside, no appreciable fluid to be sent; would continue BS abx, anti-inflammatories as last visit wrist fluid cultures were negative!  Gentle elevate wrist, splint.  Tayvien Kane CHRISTOPHER 07/02/2012, 6:05 PM

## 2012-07-02 NOTE — Progress Notes (Addendum)
ANTIBIOTIC CONSULT NOTE - INITIAL  Pharmacy Consult for Vancomycin Indication: wrist infection  Allergies  Allergen Reactions  . Amoxicillin-Pot Clavulanate Diarrhea  . Nitrofurantoin Diarrhea    Family and Per nursing home mar  . Banana Flavor Rash  . Codeine Palpitations    Family states not a true allergy, Per nursing home mar  . Lidocaine Palpitations    Family states not a true allergy, Per nursing home mar    Patient Measurements:    Vital Signs: Temp: 97.5 F (36.4 C) (05/25 1309) Temp src: Oral (05/25 1309) BP: 126/53 mmHg (05/25 1309) Pulse Rate: 56 (05/25 1309) Intake/Output from previous day:   Intake/Output from this shift: Total I/O In: -  Out: 50 [Urine:50]  Labs:  Recent Labs  07/02/12 1459  WBC 13.5*  HGB 13.1  PLT 207  CREATININE 1.21*   The CrCl is unknown because both a height and weight (above a minimum accepted value) are required for this calculation. No results found for this basename: VANCOTROUGH, Leodis Binet, VANCORANDOM, GENTTROUGH, GENTPEAK, GENTRANDOM, TOBRATROUGH, TOBRAPEAK, TOBRARND, AMIKACINPEAK, AMIKACINTROU, AMIKACIN,  in the last 72 hours   Microbiology: Recent Results (from the past 720 hour(s))  MRSA PCR SCREENING     Status: None   Collection Time    06/21/12  7:05 AM      Result Value Range Status   MRSA by PCR NEGATIVE  NEGATIVE Final   Comment:            The GeneXpert MRSA Assay (FDA     approved for NASAL specimens     only), is one component of a     comprehensive MRSA colonization     surveillance program. It is not     intended to diagnose MRSA     infection nor to guide or     monitor treatment for     MRSA infections.  URINE CULTURE     Status: None   Collection Time    06/21/12  8:58 AM      Result Value Range Status   Specimen Description URINE, RANDOM   Final   Special Requests NONE   Final   Culture  Setup Time 06/21/2012 13:53   Final   Colony Count NO GROWTH   Final   Culture NO GROWTH   Final   Report Status 06/22/2012 FINAL   Final  BODY FLUID CULTURE     Status: None   Collection Time    06/21/12 12:25 PM      Result Value Range Status   Specimen Description SYNOVIAL LT WRIST   Final   Special Requests NONE   Final   Gram Stain     Final   Value: MODERATE WBC PRESENT,BOTH PMN AND MONONUCLEAR     NO ORGANISMS SEEN   Culture NO GROWTH 3 DAYS   Final   Report Status 06/24/2012 FINAL   Final    Medical History: Past Medical History  Diagnosis Date  . Atrial fibrillation   . Unspecified essential hypertension   . Alzheimer disease   . UTI (urinary tract infection)   . Septic arthritis of wrist, left 06/2012    Medications:   (Not in a hospital admission)  Assessment: 79yo F from NH with recent diagnosis of septic arthritis of the L wrist, s/p arthrocentesis and irrigation on 5/14, presents with worsening erythema. Pharmacy asked to dose Vancomycin.  Synovial fluid culture from 5/14 was negative.  SCr 1.2, CrCl ~36  Goal of Therapy:  Vancomycin  trough level 15-20 mcg/ml  Plan:   Vancomycin 1g IV q24h.  Measure Vanc trough at steady state.  Follow up renal fxn and culture results.  Charolotte Eke, PharmD, pager [phone]. 07/02/2012,4:37 PM.  Addendum:  MD adding Primaxin to broaden-coverage for wrist infection and for suspected UTI. Will start with 250mg  IV q6h.  Charolotte Eke, PharmD, pager [phone]. 07/02/2012,5:38 PM.

## 2012-07-02 NOTE — H&P (Addendum)
PCP:   [name], MD   Chief Complaint:  Progressive pain, swelling and redness left hand.  HPI: This is an 79 year old female nursing facility resident, with known history of Alzheimer's dementia, HTN, paroxysmal atrial fibrillation, hypothyroidism, who was hospitalized 06/20/12-06/24/12, for left wrist septic arthritis, managed by Dr Izora Ribas, hand surgeon, with arthrocentesis and irrigation of wrist joint on 06/20/12. Cultures were negative. She was subsequently discharged on a further 7 days of oral Doxycycline, which was concluded on 07/01/12. Per patient's daughter/HPOA, Kaitlyn Lucero, who was in the ED, Patient's hand appeared obviously improved as of 06/26/12, but overnight 07/01/12, appeared suddenly worse, with increased swelling, pain and redness, very evident this AM. Brought to the ED, accordingly.      Allergies:   Allergies  Allergen Reactions  . Amoxicillin-Pot Clavulanate Diarrhea  . Nitrofurantoin Diarrhea    Family and Per nursing home mar  . Banana Flavor Rash  . Codeine Palpitations    Family states not a true allergy, Per nursing home mar  . Lidocaine Palpitations    Family states not a true allergy, Per nursing home mar      Past Medical History  Diagnosis Date  . Atrial fibrillation   . Unspecified essential hypertension   . Alzheimer disease   . UTI (urinary tract infection)   . Septic arthritis of wrist, left 06/2012    Past Surgical History  Procedure Laterality Date  . Bladder suspension  1953  . Cesarean section  1961  . Abdominal hysterectomy  1964  . Bladder surgery  1977  . Cataract extraction  2003    left eye  . Irrigation and debridement abscess Left 06/21/2012    Procedure: irrigation and debridement left wrist;  Surgeon: Johnette Abraham, MD;  Location: [facility];  Service: Plastics;  Laterality: Left;  left wrist    Prior to Admission medications   Medication Sig Start Date End Date Taking? Authorizing Provider  acetaminophen (TYLENOL)  325 MG tablet Take 650 mg by mouth every 4 (four) hours as needed (headache).   Yes Historical Provider, MD  aspirin 81 MG chewable tablet Chew 81 mg by mouth daily.   Yes Historical Provider, MD  benazepril (LOTENSIN) 40 MG tablet Take 40 mg by mouth daily.   Yes Historical Provider, MD  feeding supplement (ENSURE IMMUNE HEALTH) LIQD Take 237 mLs by mouth 2 (two) times daily. Vanilla flavor    Yes Historical Provider, MD  guaiFENesin-dextromethorphan (ROBITUSSIN DM) 100-10 MG/5ML syrup Take 10 mLs by mouth 3 (three) times daily as needed for cough.   Yes Historical Provider, MD  isosorbide mononitrate (IMDUR) 30 MG 24 hr tablet Take 15 mg by mouth daily.   Yes Historical Provider, MD  levothyroxine (SYNTHROID, LEVOTHROID) 75 MCG tablet Take 75 mcg by mouth daily before breakfast.    Yes Historical Provider, MD  metoprolol succinate (TOPROL-XL) 100 MG 24 hr tablet Take 100 mg by mouth every morning. Take with or immediately following a meal.   Yes Historical Provider, MD  ondansetron (ZOFRAN) 4 MG tablet Take 4 mg by mouth every 8 (eight) hours as needed for nausea.   Yes Historical Provider, MD  traMADol (ULTRAM) 50 MG tablet Take 1 tablet (50 mg total) by mouth every 6 (six) hours as needed for pain. 06/24/12  Yes Penny Pia, MD  bisacodyl (DULCOLAX) 10 MG suppository Place 10 mg rectally daily as needed for constipation.    Historical Provider, MD    Social History: Patient has never smoked.  She has never used smokeless tobacco. She reports that she does not drink alcohol or use illicit drugs.  Family History  Problem Relation Age of Onset  . Other Mother     lung and heart problems  . Other Father     pace maker, heart problems  . Hypertension Brother   . Other Brother     CABG x3  . Hypertension Sister     Review of Systems:  As per HPI and chief complaint. Unobtainable form patient, given dementia, but per daughter, she has had no vomiting, abdominal pain, vomiting or diarrhea, no  chest pain, cough, shortness of breath, hematochezia, lower extremity swelling, pain, or redness.  Physical Exam:  General:  Patient is somnolent, but easily rousable, although she drifts off again, soon after. Does not appear to be in obvious acute distress, not short of breath at rest.  HEENT:  No clinical pallor, no jaundice, no conjunctival injection or discharge. Hydration appears fair.  NECK:  Supple, JVP not seen, no carotid bruits, no palpable lymphadenopathy, no palpable goiter. CHEST:  Clinically clear to auscultation, no wheezes, no crackles. HEART:  Sounds 1 and 2 heard, normal, regular, no murmurs. ABDOMEN:  Full, soft, non-tender, no palpable organomegaly, no palpable masses, normal bowel sounds. GENITALIA:  Not examined. LOWER EXTREMITIES:  No pitting edema, palpable peripheral pulses. MUSCULOSKELETAL SYSTEM:  Patient has swelling/erythema of the left hand, wrist and up to upper third of left forearm, with increased local temperature and tenderness, particularly on flexion/extension. Otherwise, patient has generalized osteoarthritic changes. CENTRAL NERVOUS SYSTEM:  No focal neurologic deficit on gross examination.  Labs on Admission:  Results for orders placed during the hospital encounter of 07/02/12 (from the past 48 hour(s))  URINALYSIS, ROUTINE W REFLEX MICROSCOPIC     Status: Abnormal   Collection Time    07/02/12  2:43 PM      Result Value Range   Color, Urine YELLOW  YELLOW   APPearance CLOUDY (*) CLEAR   Specific Gravity, Urine 1.017  1.005 - 1.030   pH 6.5  5.0 - 8.0   Glucose, UA NEGATIVE  NEGATIVE mg/dL   Hgb urine dipstick NEGATIVE  NEGATIVE   Bilirubin Urine NEGATIVE  NEGATIVE   Ketones, ur NEGATIVE  NEGATIVE mg/dL   Protein, ur 30 (*) NEGATIVE mg/dL   Urobilinogen, UA 1.0  0.0 - 1.0 mg/dL   Nitrite POSITIVE (*) NEGATIVE   Leukocytes, UA MODERATE (*) NEGATIVE  URINE MICROSCOPIC-ADD ON     Status: Abnormal   Collection Time    07/02/12  2:43 PM       Result Value Range   WBC, UA 11-20  <3 WBC/hpf   Comment: WITH CLUMPS   Bacteria, UA MANY (*) RARE  BASIC METABOLIC PANEL     Status: Abnormal   Collection Time    07/02/12  2:59 PM      Result Value Range   Sodium 134 (*) 135 - 145 mEq/L   Potassium 4.4  3.5 - 5.1 mEq/L   Chloride 98  96 - 112 mEq/L   CO2 24  19 - 32 mEq/L   Glucose, Bld 132 (*) 70 - 99 mg/dL   BUN 21  6 - 23 mg/dL   Creatinine, Ser 1.91 (*) 0.50 - 1.10 mg/dL   Calcium 9.3  8.4 - 47.8 mg/dL   GFR calc non Af Amer 39 (*) >90 mL/min   GFR calc Af Amer 45 (*) >90 mL/min   Comment:  The eGFR has been calculated     using the CKD EPI equation.     This calculation has not been     validated in all clinical     situations.     eGFR's persistently     <90 mL/min signify     possible Chronic Kidney Disease.  CBC WITH DIFFERENTIAL     Status: Abnormal   Collection Time    07/02/12  2:59 PM      Result Value Range   WBC 13.5 (*) 4.0 - 10.5 K/uL   RBC 4.24  3.87 - 5.11 MIL/uL   Hemoglobin 13.1  12.0 - 15.0 g/dL   HCT 16.1  09.6 - 04.5 %   MCV 89.6  78.0 - 100.0 fL   MCH 30.9  26.0 - 34.0 pg   MCHC 34.5  30.0 - 36.0 g/dL   RDW 40.9  81.1 - 91.4 %   Platelets 207  150 - 400 K/uL   Neutrophils Relative % 78 (*) 43 - 77 %   Neutro Abs 10.5 (*) 1.7 - 7.7 K/uL   Lymphocytes Relative 12  12 - 46 %   Lymphs Abs 1.7  0.7 - 4.0 K/uL   Monocytes Relative 10  3 - 12 %   Monocytes Absolute 1.3 (*) 0.1 - 1.0 K/uL   Eosinophils Relative 0  0 - 5 %   Eosinophils Absolute 0.0  0.0 - 0.7 K/uL   Basophils Relative 0  0 - 1 %   Basophils Absolute 0.0  0.0 - 0.1 K/uL  LACTIC ACID, PLASMA     Status: Abnormal   Collection Time    07/02/12  2:59 PM      Result Value Range   Lactic Acid, Venous 2.3 (*) 0.5 - 2.2 mmol/L    Radiological Exams on Admission: Dg Wrist Complete Left  07/02/2012   *RADIOLOGY REPORT*  Clinical Data: Pain, redness, swelling, heat throughout the hand and wrist.  History of septic arthritis.   LEFT WRIST - COMPLETE 3+ VIEW  Comparison: 06/20/2012  Findings: Bones appear radiolucent. There are marked degenerate changes throughout the carpals, especially involving the first carpometacarpal joint.  No evidence for acute fracture or subluxation.  IMPRESSION: Stable appearance of the wrist.   Original Report Authenticated By: Norva Pavlov, M.D.   Dg Hand Complete Left  07/02/2012   *RADIOLOGY REPORT*  Clinical Data: Pain, swelling, redness, heat in the hand and wrist. History of septic arthritis.  LEFT HAND - COMPLETE 3+ VIEW  Comparison: 06/20/2012  Findings: There are marked degenerative changes in the interphalangeal joints and wrist.  No evidence for acute fracture or subluxation.  No radiopaque foreign body or soft tissue gas.  IMPRESSION:  1.  Significant degenerative changes. 2. No evidence for acute  abnormality.   Original Report Authenticated By: Norva Pavlov, M.D.    Assessment/Plan Active Problems:    1. Cellulitis of left hand: Patient is s/p hospitalization 06/20/12-06/24/12, for left wrist septic arthritis, managed with arthrocentesis/irrigation of wrist joint on 06/20/12, a swell as antibiotics, concluded on 07/01/12. Cultures were negative. She now represents with progressive swelling, pain, redness and tenderness of of the same hand/forearm, as well as restricted ROM of the wrist. Wcc is elevated at 13.5, she is afebrile, and X-rays have revealed no acute bony findings. We shall manage with elevation, analgesics, and broad spectrum antibiotic coverage with iv Vancomycin and Primaxin. Blood cultures will be sent off. Dr Izora Ribas, hand surgeon is familiar with patient, and  has already been consulted by ED MD. We shall manage as recommended.  2. HTN (hypertension): BP is controlled on pre-admission antihypertensives, which we shall continue.  3. PAF (paroxysmal atrial fibrillation): Patient is currently in SR, and is even mildly bradycardic, with HR of 55-56/min. Not a candidate for  anticoagulation, due to dementia and high fall risk. Will continue pre-admission Metoprolol XL, but reduce dose to 50 mg daily (was on 100 mg daily).  4. Hypothyroidism: Continued on pre-admission thyroxine replacement therapy. Check TSH.  5. Alzheimer's dementia: Stable. 6. UTI: Urinalysis revealed a positive sediment, with pyuria and bacteriuria. Adequately covered with Primaxin, pending culture.   Further management will de[pend on clinical course.  Comment: Have clarified Code Status with HPOA, and she has confirmed that patient is DNR. Marland Kitchen   Note: Daughter/HPOA is Kaitlyn Lucero: Tel: (H): [phone] or (C): [phone].  Time Spent on Admission: 1 hour.  Onesha Krebbs,CHRISTOPHER 07/02/2012, 5:02 PM

## 2012-07-02 NOTE — ED Notes (Signed)
Per EMS: Pt has hx of "joint infection" in left hand.  Went to surgery for it about a week and a half ago.  Hand became swollen, red, and hot to touch today.

## 2012-07-03 ENCOUNTER — Encounter (HOSPITAL_COMMUNITY): Payer: Self-pay

## 2012-07-03 DIAGNOSIS — E039 Hypothyroidism, unspecified: Secondary | ICD-10-CM

## 2012-07-03 DIAGNOSIS — N390 Urinary tract infection, site not specified: Secondary | ICD-10-CM | POA: Diagnosis present

## 2012-07-03 LAB — CBC
Hemoglobin: 11 g/dL — ABNORMAL LOW (ref 12.0–15.0)
MCH: 29.7 pg (ref 26.0–34.0)
MCHC: 33.3 g/dL (ref 30.0–36.0)
Platelets: 250 10*3/uL (ref 150–400)
RDW: 13.5 % (ref 11.5–15.5)

## 2012-07-03 LAB — BASIC METABOLIC PANEL
BUN: 20 mg/dL (ref 6–23)
CO2: 22 mEq/L (ref 19–32)
Chloride: 103 mEq/L (ref 96–112)
Creatinine, Ser: 0.93 mg/dL (ref 0.50–1.10)
Glucose, Bld: 102 mg/dL — ABNORMAL HIGH (ref 70–99)
Sodium: 136 mEq/L (ref 135–145)

## 2012-07-03 MED ORDER — VANCOMYCIN HCL 10 G IV SOLR
1250.0000 mg | INTRAVENOUS | Status: DC
  Administered 2012-07-03 – 2012-07-05 (×3): 1250 mg via INTRAVENOUS
  Filled 2012-07-03 (×4): qty 1250

## 2012-07-03 MED ORDER — SODIUM CHLORIDE 0.9 % IV SOLN
500.0000 mg | Freq: Three times a day (TID) | INTRAVENOUS | Status: DC
  Administered 2012-07-03 – 2012-07-04 (×4): 500 mg via INTRAVENOUS
  Filled 2012-07-03 (×5): qty 500

## 2012-07-03 MED ORDER — POTASSIUM CHLORIDE CRYS ER 20 MEQ PO TBCR
40.0000 meq | EXTENDED_RELEASE_TABLET | Freq: Once | ORAL | Status: AC
  Administered 2012-07-03: 40 meq via ORAL
  Filled 2012-07-03: qty 2

## 2012-07-03 MED ORDER — IBUPROFEN 200 MG PO TABS
200.0000 mg | ORAL_TABLET | Freq: Three times a day (TID) | ORAL | Status: AC
  Administered 2012-07-03 – 2012-07-06 (×9): 200 mg via ORAL
  Filled 2012-07-03 (×10): qty 1

## 2012-07-03 NOTE — Progress Notes (Signed)
ANTIBIOTIC CONSULT NOTE - FOLLOW UP  Pharmacy Consult for Vancomycin, Primaxin Indication: hand/wrist infection, r/o UTI  Allergies  Allergen Reactions  . Amoxicillin-Pot Clavulanate Diarrhea  . Nitrofurantoin Diarrhea    Family and Per nursing home mar  . Banana Flavor Rash  . Codeine Palpitations    Family states not a true allergy, Per nursing home mar  . Lidocaine Palpitations    Family states not a true allergy, Per nursing home mar    Patient Measurements: Height: 5' 1.02" (155 cm) (as documented 06/21/12) Weight: 159 lb 2.8 oz (72.2 kg) (as documented 06/21/12) IBW/kg (Calculated) : 47.86   Vital Signs: Temp: 97.6 F (36.4 C) (05/26 0456) Temp src: Oral (05/26 0456) BP: 177/51 mmHg (05/26 0456) Pulse Rate: 66 (05/26 0456)  Labs:  Recent Labs  07/02/12 1459 07/03/12 0500  WBC 13.5* 10.5  HGB 13.1 11.0*  PLT 207 250  CREATININE 1.21* 0.93   Estimated Creatinine Clearance: 38 ml/min (by C-G formula based on Cr of 0.93). No results found for this basename: Rolm Gala, VANCORANDOM, GENTTROUGH, GENTPEAK, GENTRANDOM, TOBRATROUGH, TOBRAPEAK, TOBRARND, AMIKACINPEAK, AMIKACINTROU, AMIKACIN,  in the last 72 hours    Assessment: 79yo F from NH with recent diagnosis of septic arthritis of the L wrist, s/p arthrocentesis and irrigation on 5/13 (culture negative) and discharged on 7 more days of Doxycycline (ended 5/24) presented 5/25 with worsened erythema, pain, swelling. X-ray with no acute bony findings.  Also suspecting UTI, MD ordered Vanc and Primaxin.    Today is D#2 of abx therapy, pt is afebrile, WBC wnl, Scr improved to 0.93, CG 38 m/min, normalized CrCl 48 ml/min. Wrist aspirated at bedside 5/25, no appreciable fluid to be sent for cx.  Urine and blood cultures from 5/25 pending  Goal of Therapy:  Vancomycin trough level 15-20 mcg/ml (aim for higher trough given wrist involvement)  Plan:   Change Primaxin to 500 mg Iv q8h  Change Vancomycin to  1250 mg IV q24h  Pharmacy will f/u  Geoffry Paradise, PharmD, BCPS Pager: [phone] 12:03 PM Pharmacy #: [id number]

## 2012-07-03 NOTE — Progress Notes (Signed)
07-03-12 0630  Pt remains confused, now trying to get oob, pulling on tubes, not keeping her left arm elevated.  Dtr had warned me that pt has a history of becoming increasingly confused while in the hospital.  Pain, elimination and teaching have been addressed.  We called dtr from bedside so she could talk to Mother without any improvement.  Dtr unable to come to hospital for a couple of hours, states she would understand and approve of restraints if necessary but is unsure if mother would escalate further with that safety measure.  We have arrange a safety sitter to be at bedside currently b/c pt needs one on one constant observation and care for safety.  VSS, left upper extremety is no longer deep red it is more of a deep pink this AM and the edema has been reduced.

## 2012-07-03 NOTE — Progress Notes (Signed)
[facility] PROGRESS NOTE  Kaitlyn Lucero YNW:[id number] DOB: 04/09/1921 DOA: 07/02/2012 PCP: [name], MD  Assessment/Plan: Active Problems:   Cellulitis of left hand   HTN (hypertension)   PAF (paroxysmal atrial fibrillation)   Hypothyroidism   Alzheimer's dementia    1. Cellulitis of left hand: Patient is s/p hospitalization 06/20/12-06/24/12, for left wrist septic arthritis, managed with arthrocentesis/irrigation of wrist joint on 06/20/12, a swell as antibiotics, concluded on 07/01/12. Cultures were negative. She now represents with progressive swelling, pain, redness and tenderness of of the same hand/forearm, as well as restricted ROM of the wrist. Wcc was elevated at 13.5, she was afebrile, and X-rays have revealed no acute bony findings. Managing with elevation, analgesics, and broad spectrum antibiotic coverage with iv Vancomycin and Primaxin, now day #2. Blood cultures are pending. Dr Izora Ribas, hand surgeon is familiar with patient, provided a consultation, and performed a bedside joint aspiration on 07/02/12, with no appreciable fluid obtained. He has recommended continue current treatment , as well as NSAIDS. Local inflammatory phenomena have already improved.  2. HTN (hypertension): BP is controlled on pre-admission antihypertensives. Continued.  3. PAF (paroxysmal atrial fibrillation): Patient is currently in SR, and is even mildly bradycardic, with HR of 55-56/min. Not a candidate for anticoagulation, due to dementia and high fall risk. Continued on Metoprolol XL, in reduced dose of 50 mg daily (was on 100 mg daily). Today, HR is in the 70s. 4. Hypothyroidism: Continued on pre-admission thyroxine replacement therapy. Check TSH.  5. Alzheimer's dementia: Stable.  6. UTI: Urinalysis revealed a positive sediment, with pyuria and bacteriuria. Adequately covered with Primaxin. Culture revealed E. Coli. Sensitivities are pending.    Code Status: DNR.  Family  Communication: Disposition Plan: To be determined.    Brief narrative: This is an 79 year old female nursing facility resident, with known history of Alzheimer's dementia, HTN, paroxysmal atrial fibrillation, hypothyroidism, who was hospitalized 06/20/12-06/24/12, for left wrist septic arthritis, managed by Dr Izora Ribas, hand surgeon, with arthrocentesis and irrigation of wrist joint on 06/20/12. Cultures were negative. She was subsequently discharged on a further 7 days of oral Doxycycline, which was concluded on 07/01/12. Per patient's daughter/HPOA, [name], who was in the ED, Patient's hand appeared obviously improved as of 06/26/12, but overnight 07/01/12, appeared suddenly worse, with increased swelling, pain and redness, very evident this AM. Brought to the ED, accordingly.    Consultants:  Dr Izora Ribas, Hydrographic surveyor.   Procedures:  X-Rays left hand and wrist   Antibiotics:  Vancomycin 07/02/12>>>  Primaxin 07/02/12>>>  HPI/Subjective: Confused.   Objective: Vital signs in last 24 hours: Temp:  [97.5 F (36.4 C)-98.3 F (36.8 C)] 98.3 F (36.8 C) (05/26 1418) Pulse Rate:  [55-72] 71 (05/26 1418) Resp:  [15-20] 20 (05/26 1418) BP: (118-187)/(51-80) 187/63 mmHg (05/26 1418) SpO2:  [97 %-99 %] 99 % (05/26 1418) Weight:  [72.2 kg (159 lb 2.8 oz)] 72.2 kg (159 lb 2.8 oz) (05/26 1100) Weight change:  Last BM Date:  (unknown)  Intake/Output from previous day: 05/25 0701 - 05/26 0700 In: 1300 [P.O.:300; I.V.:900; IV Piggyback:100] Out: 50 [Urine:50] Total I/O In: 180 [P.O.:180] Out: 150 [Urine:150]   Physical Exam: General: Confused. Does not appear to be in obvious acute distress, not short of breath at rest.  HEENT: No clinical pallor, no jaundice, no conjunctival injection or discharge. Hydration appears fair.  NECK: Supple, JVP not seen, no carotid bruits, no palpable lymphadenopathy, no palpable goiter.  CHEST: Clinically clear to auscultation, no wheezes, no crackles.  HEART: Sounds 1 and 2 heard, normal, regular, no murmurs.  ABDOMEN: Full, soft, non-tender, no palpable organomegaly, no palpable masses, normal bowel sounds.  GENITALIA: Not examined.  LOWER EXTREMITIES: No pitting edema, palpable peripheral pulses.  MUSCULOSKELETAL SYSTEM: Swelling/erythema of the left hand and wrist hve considerably ameliorated overnight, with improved ROM. Otherwise, patient has generalized osteoarthritic changes.  CENTRAL NERVOUS SYSTEM: No focal neurologic deficit on gross examination.  Lab Results:  Recent Labs  07/02/12 1459 07/03/12 0500  WBC 13.5* 10.5  HGB 13.1 11.0*  HCT 38.0 33.0*  PLT 207 250    Recent Labs  07/02/12 1459 07/03/12 0500  NA 134* 136  K 4.4 3.3*  CL 98 103  CO2 24 22  GLUCOSE 132* 102*  BUN 21 20  CREATININE 1.21* 0.93  CALCIUM 9.3 8.6   Recent Results (from the past 240 hour(s))  URINE CULTURE     Status: None   Collection Time    07/02/12  2:43 PM      Result Value Range Status   Specimen Description URINE, CATHETERIZED   Final   Special Requests NONE   Final   Culture  Setup Time 07/02/2012 18:44   Final   Colony Count >=100,000 COLONIES/ML   Final   Culture ESCHERICHIA COLI   Final   Report Status PENDING   Incomplete  MRSA PCR SCREENING     Status: None   Collection Time    07/02/12  7:21 PM      Result Value Range Status   MRSA by PCR NEGATIVE  NEGATIVE Final   Comment:            The GeneXpert MRSA Assay (FDA     approved for NASAL specimens     only), is one component of a     comprehensive MRSA colonization     surveillance program. It is not     intended to diagnose MRSA     infection nor to guide or     monitor treatment for     MRSA infections.     Studies/Results: Dg Wrist Complete Left  07/02/2012   *RADIOLOGY REPORT*  Clinical Data: Pain, redness, swelling, heat throughout the hand and wrist.  History of septic arthritis.  LEFT WRIST - COMPLETE 3+ VIEW  Comparison: 06/20/2012  Findings: Bones  appear radiolucent. There are marked degenerate changes throughout the carpals, especially involving the first carpometacarpal joint.  No evidence for acute fracture or subluxation.  IMPRESSION: Stable appearance of the wrist.   Original Report Authenticated By: Norva Pavlov, M.D.   Dg Hand Complete Left  07/02/2012   *RADIOLOGY REPORT*  Clinical Data: Pain, swelling, redness, heat in the hand and wrist. History of septic arthritis.  LEFT HAND - COMPLETE 3+ VIEW  Comparison: 06/20/2012  Findings: There are marked degenerative changes in the interphalangeal joints and wrist.  No evidence for acute fracture or subluxation.  No radiopaque foreign body or soft tissue gas.  IMPRESSION:  1.  Significant degenerative changes. 2. No evidence for acute  abnormality.   Original Report Authenticated By: Norva Pavlov, M.D.    Medications: Scheduled Meds: . aspirin  81 mg Oral Daily  . benazepril  40 mg Oral Daily  . feeding supplement  237 mL Oral BID  . heparin  5,000 Units Subcutaneous Q8H  . imipenem-cilastatin  500 mg Intravenous Q8H  . isosorbide mononitrate  15 mg Oral Daily  . levothyroxine  75 mcg Oral QAC breakfast  . metoprolol succinate  50 mg Oral q morning - 10a  . potassium chloride  40 mEq Oral Once  . sodium chloride  3 mL Intravenous Q12H  . sodium chloride  3 mL Intravenous Q12H  . vancomycin  1,250 mg Intravenous Q24H   Continuous Infusions: . sodium chloride 75 mL/hr at 07/02/12 1433   PRN Meds:.sodium chloride, acetaminophen, acetaminophen, bisacodyl, HYDROmorphone (DILAUDID) injection, ondansetron (ZOFRAN) IV, ondansetron, sodium chloride, traMADol    LOS: 1 day   Takumi Din,CHRISTOPHER  [facility] Pager [phone]. If 8PM-8AM, please contact night-coverage at [email or web address], password [facility] 07/03/2012, 4:32 PM  LOS: 1 day

## 2012-07-04 ENCOUNTER — Telehealth (HOSPITAL_COMMUNITY): Payer: Self-pay | Admitting: Emergency Medicine

## 2012-07-04 DIAGNOSIS — I10 Essential (primary) hypertension: Secondary | ICD-10-CM

## 2012-07-04 LAB — CBC
HCT: 32 % — ABNORMAL LOW (ref 36.0–46.0)
Hemoglobin: 10.5 g/dL — ABNORMAL LOW (ref 12.0–15.0)
MCHC: 32.8 g/dL (ref 30.0–36.0)
MCV: 90.4 fL (ref 78.0–100.0)

## 2012-07-04 LAB — URINE CULTURE

## 2012-07-04 LAB — BASIC METABOLIC PANEL
BUN: 16 mg/dL (ref 6–23)
Creatinine, Ser: 0.83 mg/dL (ref 0.50–1.10)
GFR calc non Af Amer: 61 mL/min — ABNORMAL LOW (ref >90)
Glucose, Bld: 97 mg/dL (ref 70–99)
Potassium: 4.5 mEq/L (ref 3.5–5.1)

## 2012-07-04 MED ORDER — AMLODIPINE BESYLATE 5 MG PO TABS
5.0000 mg | ORAL_TABLET | Freq: Every day | ORAL | Status: DC
  Administered 2012-07-04 – 2012-07-08 (×5): 5 mg via ORAL
  Filled 2012-07-04 (×5): qty 1

## 2012-07-04 MED ORDER — FOSFOMYCIN TROMETHAMINE 3 G PO PACK
3.0000 g | PACK | Freq: Once | ORAL | Status: AC
  Administered 2012-07-04: 3 g via ORAL
  Filled 2012-07-04: qty 3

## 2012-07-04 NOTE — Progress Notes (Addendum)
[facility] PROGRESS NOTE  Kaitlyn Lucero EAV:[id number] DOB: [birth year] DOA: 07/02/2012 PCP: Kaitlyn Alken, MD  Assessment/Plan: Active Problems:   Cellulitis of left hand   HTN (hypertension)   PAF (paroxysmal atrial fibrillation)   Hypothyroidism   Alzheimer's dementia   UTI (urinary tract infection)    1. Cellulitis of left hand: Patient is s/p hospitalization 06/20/12-06/24/12, for left wrist septic arthritis, managed with arthrocentesis/irrigation of wrist joint on 06/20/12, a swell as antibiotics, concluded on 07/01/12. Cultures were negative. She now represents with progressive swelling, pain, redness and tenderness of of the same hand/forearm, as well as restricted ROM of the wrist. Wcc was elevated at 13.5, she was afebrile, and X-rays have revealed no acute bony findings. Managing with elevation, analgesics, and broad spectrum antibiotic coverage with iv Vancomycin and Primaxin, now day #2. Blood cultures are pending. Dr Kaitlyn Lucero, hand surgeon is familiar with patient, provided a consultation, and performed a bedside joint aspiration on 07/02/12, with no appreciable fluid obtained. He has recommended continue current treatment , as well as NSAIDS. Local inflammatory phenomena have dramatically improved with only minimal erythema as of 07/04/12. Will DC Motrin after a total 3-day course, in view of low GFR. Primaxin discontinued today.  2. HTN (hypertension): BP is sub-optimally controlled on pre-admission antihypertensives (Metoprolol XL was halved due to bradycardia on admission). Have added Norvasc today.  3. PAF (paroxysmal atrial fibrillation): Patient is currently in SR, and is even mildly bradycardic, with HR of 55-56/min. Not a candidate for anticoagulation, due to dementia and high fall risk. Continued on Metoprolol XL, in reduced dose of 50 mg daily (was on 100 mg daily). Today, HR is now stable in the 70s. 4. Hypothyroidism: Continued on pre-admission thyroxine  replacement therapy. Check TSH.  5. Alzheimer's dementia: Stable.  6. UTI: Urinalysis revealed a positive sediment, with pyuria and bacteriuria, felt adequately covered with Primaxin. Culture however, revealed E. Coli. Sensitivities demonstrated ESBL. Administered a single dose of Fosfomycin today, to conclude UTI treatment. Daughter is very concerned about recurrent UTIs, and the feeling is that patient will likely benefit from long-term prophylactic antibiotic, possibly Bactrim.    Code Status: DNR.  Family Communication: Disposition Plan: To be determined.    Brief narrative: This is an 79 year old female nursing facility resident, with known history of Alzheimer's dementia, HTN, paroxysmal atrial fibrillation, hypothyroidism, who was hospitalized 06/20/12-06/24/12, for left wrist septic arthritis, managed by Dr Kaitlyn Lucero, hand surgeon, with arthrocentesis and irrigation of wrist joint on 06/20/12. Cultures were negative. She was subsequently discharged on a further 7 days of oral Doxycycline, which was concluded on 07/01/12. Per patient's daughter/HPOA, Kaitlyn Lucero, who was in the ED, Patient's hand appeared obviously improved as of 06/26/12, but overnight 07/01/12, appeared suddenly worse, with increased swelling, pain and redness, very evident this AM. Brought to the ED, accordingly.    Consultants:  Dr Kaitlyn Lucero, Hydrographic surveyor.   Procedures:  X-Rays left hand and wrist   Antibiotics:  Vancomycin 07/02/12>>>  Primaxin 07/02/12>>>  HPI/Subjective: Much better. Mental status is at baseline today.   Objective: Vital signs in last 24 hours: Temp:  [97.7 F (36.5 C)-98.5 F (36.9 C)] 97.7 F (36.5 C) (05/27 0700) Pulse Rate:  [70-78] 74 (05/27 1026) Resp:  [18-20] 18 (05/27 0700) BP: (160-188)/(61-66) 175/66 mmHg (05/27 1026) SpO2:  [97 %-99 %] 97 % (05/27 0700) Weight change:  Last BM Date:  (unknown)  Intake/Output from previous day: 05/26 0701 - 05/27 0700 In: 2008.3 [P.O.:740;  I.V.:1268.3] Out: 150 [Urine:150]  Total I/O In: 540 [P.O.:240; IV Piggyback:300] Out: 75 [Urine:75]   Physical Exam: General: Confused. Does not appear to be in obvious acute distress, not short of breath at rest.  HEENT: No clinical pallor, no jaundice, no conjunctival injection or discharge. Hydration appears fair.  NECK: Supple, JVP not seen, no carotid bruits, no palpable lymphadenopathy, no palpable goiter.  CHEST: Clinically clear to auscultation, no wheezes, no crackles.  HEART: Sounds 1 and 2 heard, normal, regular, no murmurs.  ABDOMEN: Full, soft, non-tender, no palpable organomegaly, no palpable masses, normal bowel sounds.  GENITALIA: Not examined.  LOWER EXTREMITIES: No pitting edema, palpable peripheral pulses.  MUSCULOSKELETAL SYSTEM: Swelling/erythema of the left hand and wrist have dramatically improved. Otherwise, patient has generalized osteoarthritic changes.  CENTRAL NERVOUS SYSTEM: No focal neurologic deficit on gross examination.  Lab Results:  Recent Labs  07/03/12 0500 07/04/12 0459  WBC 10.5 8.9  HGB 11.0* 10.5*  HCT 33.0* 32.0*  PLT 250 231    Recent Labs  07/03/12 0500 07/04/12 0459  NA 136 135  K 3.3* 4.5  CL 103 105  CO2 22 21  GLUCOSE 102* 97  BUN 20 16  CREATININE 0.93 0.83  CALCIUM 8.6 8.5   Recent Results (from the past 240 hour(s))  URINE CULTURE     Status: None   Collection Time    07/02/12  2:43 PM      Result Value Range Status   Specimen Description URINE, CATHETERIZED   Final   Special Requests NONE   Final   Culture  Setup Time 07/02/2012 18:44   Final   Colony Count >=100,000 COLONIES/ML   Final   Culture ESCHERICHIA COLI   Final   Report Status PENDING   Incomplete  CULTURE, BLOOD (ROUTINE X 2)     Status: None   Collection Time    07/02/12  7:20 PM      Result Value Range Status   Specimen Description BLOOD RIGHT HAND   Final   Special Requests BOTTLES DRAWN AEROBIC ONLY 1CC   Final   Culture  Setup Time  07/03/2012 11:07   Final   Culture     Final   Value:        BLOOD CULTURE RECEIVED NO GROWTH TO DATE CULTURE WILL BE HELD FOR 5 DAYS BEFORE ISSUING A FINAL NEGATIVE REPORT   Report Status PENDING   Incomplete  MRSA PCR SCREENING     Status: None   Collection Time    07/02/12  7:21 PM      Result Value Range Status   MRSA by PCR NEGATIVE  NEGATIVE Final   Comment:            The GeneXpert MRSA Assay (FDA     approved for NASAL specimens     only), is one component of a     comprehensive MRSA colonization     surveillance program. It is not     intended to diagnose MRSA     infection nor to guide or     monitor treatment for     MRSA infections.  CULTURE, BLOOD (ROUTINE X 2)     Status: None   Collection Time    07/02/12  7:26 PM      Result Value Range Status   Specimen Description BLOOD RIGHT HAND   Final   Special Requests BOTTLES DRAWN AEROBIC ONLY 1CC   Final   Culture  Setup Time 07/03/2012 11:07   Final  Culture     Final   Value:        BLOOD CULTURE RECEIVED NO GROWTH TO DATE CULTURE WILL BE HELD FOR 5 DAYS BEFORE ISSUING A FINAL NEGATIVE REPORT   Report Status PENDING   Incomplete     Studies/Results: Dg Wrist Complete Left  07/02/2012   *RADIOLOGY REPORT*  Clinical Data: Pain, redness, swelling, heat throughout the hand and wrist.  History of septic arthritis.  LEFT WRIST - COMPLETE 3+ VIEW  Comparison: 06/20/2012  Findings: Bones appear radiolucent. There are marked degenerate changes throughout the carpals, especially involving the first carpometacarpal joint.  No evidence for acute fracture or subluxation.  IMPRESSION: Stable appearance of the wrist.   Original Report Authenticated By: Norva Pavlov, M.D.   Dg Hand Complete Left  07/02/2012   *RADIOLOGY REPORT*  Clinical Data: Pain, swelling, redness, heat in the hand and wrist. History of septic arthritis.  LEFT HAND - COMPLETE 3+ VIEW  Comparison: 06/20/2012  Findings: There are marked degenerative changes in the  interphalangeal joints and wrist.  No evidence for acute fracture or subluxation.  No radiopaque foreign body or soft tissue gas.  IMPRESSION:  1.  Significant degenerative changes. 2. No evidence for acute  abnormality.   Original Report Authenticated By: Norva Pavlov, M.D.    Medications: Scheduled Meds: . aspirin  81 mg Oral Daily  . benazepril  40 mg Oral Daily  . feeding supplement  237 mL Oral BID  . heparin  5,000 Units Subcutaneous Q8H  . ibuprofen  200 mg Oral TID WC  . imipenem-cilastatin  500 mg Intravenous Q8H  . isosorbide mononitrate  15 mg Oral Daily  . levothyroxine  75 mcg Oral QAC breakfast  . metoprolol succinate  50 mg Oral q morning - 10a  . sodium chloride  3 mL Intravenous Q12H  . sodium chloride  3 mL Intravenous Q12H  . vancomycin  1,250 mg Intravenous Q24H   Continuous Infusions: . sodium chloride 50 mL/hr at 07/03/12 2146   PRN Meds:.sodium chloride, acetaminophen, acetaminophen, bisacodyl, HYDROmorphone (DILAUDID) injection, ondansetron (ZOFRAN) IV, ondansetron, sodium chloride, traMADol    LOS: 2 days   Everet Flagg,CHRISTOPHER  [facility] Pager [phone]. If 8PM-8AM, please contact night-coverage at [email or web address], password [facility] 07/04/2012, 1:47 PM  LOS: 2 days

## 2012-07-05 DIAGNOSIS — R5081 Fever presenting with conditions classified elsewhere: Secondary | ICD-10-CM

## 2012-07-05 LAB — BASIC METABOLIC PANEL
BUN: 10 mg/dL (ref 6–23)
CO2: 20 mEq/L (ref 19–32)
Chloride: 102 mEq/L (ref 96–112)
Creatinine, Ser: 0.74 mg/dL (ref 0.50–1.10)
GFR calc Af Amer: 85 mL/min — ABNORMAL LOW (ref >90)

## 2012-07-05 LAB — CBC
HCT: 39 % (ref 36.0–46.0)
MCHC: 34.6 g/dL (ref 30.0–36.0)
MCV: 87.6 fL (ref 78.0–100.0)
RDW: 13.3 % (ref 11.5–15.5)

## 2012-07-05 MED ORDER — METOPROLOL TARTRATE 1 MG/ML IV SOLN
5.0000 mg | Freq: Four times a day (QID) | INTRAVENOUS | Status: DC | PRN
  Administered 2012-07-06: 5 mg via INTRAVENOUS
  Filled 2012-07-05: qty 5

## 2012-07-05 MED ORDER — METOPROLOL SUCCINATE ER 100 MG PO TB24
100.0000 mg | ORAL_TABLET | Freq: Every morning | ORAL | Status: DC
  Administered 2012-07-05 – 2012-07-08 (×4): 100 mg via ORAL
  Filled 2012-07-05 (×4): qty 1

## 2012-07-05 MED ORDER — DILTIAZEM HCL 25 MG/5ML IV SOLN
5.0000 mg | Freq: Once | INTRAVENOUS | Status: AC
  Administered 2012-07-05: 5 mg via INTRAVENOUS
  Filled 2012-07-05: qty 5

## 2012-07-05 NOTE — Progress Notes (Signed)
ANTIBIOTIC CONSULT NOTE - FOLLOW UP  Pharmacy Consult for Vancomycin Indication: hand/wrist infection, r/o UTI  Allergies  Allergen Reactions  . Amoxicillin-Pot Clavulanate Diarrhea  . Nitrofurantoin Diarrhea    Family and Per nursing home mar  . Banana Flavor Rash  . Codeine Palpitations    Family states not a true allergy, Per nursing home mar  . Lidocaine Palpitations    Family states not a true allergy, Per nursing home mar    Patient Measurements: Height: 5' 1.02" (155 cm) (as documented 06/21/12) Weight: 159 lb 2.8 oz (72.2 kg) (as documented 06/21/12) IBW/kg (Calculated) : 47.86   Vital Signs: Temp: 97.9 F (36.6 C) (05/28 1325) Temp src: Oral (05/28 1325) BP: 147/69 mmHg (05/28 1325) Pulse Rate: 79 (05/28 1325)  Labs:  Recent Labs  07/03/12 0500 07/04/12 0459 07/05/12 0451 07/05/12 0700  WBC 10.5 8.9  --  9.3  HGB 11.0* 10.5*  --  13.5  PLT 250 231  --  297  CREATININE 0.93 0.83 0.74  --    Estimated Creatinine Clearance: 44.2 ml/min (by C-G formula based on Cr of 0.74).  Recent Labs  07/05/12 1551  VANCOTROUGH 14.9      Assessment: 79yo F from NH with recent diagnosis of septic arthritis of the L wrist, s/p arthrocentesis and irrigation on 5/13 (culture negative) and discharged on 7 more days of Doxycycline (ended 5/24) presented 5/25 with worsened erythema, pain, swelling. X-ray with no acute bony findings.  Also suspecting UTI, MD ordered Vanc and Primaxin.    Today is D#4 of abx therapy, pt is afebrile, WBC wnl, Scr stable CG 44 m/min. Wrist aspirated at bedside 5/25, no appreciable fluid to be sent for cx.  Urine with ESBL Ecoli - 1 dose of fosomycin  Vanc level just below goal at 14.9  Goal of Therapy:  Vancomycin trough level 15-20 mcg/ml (aim for higher trough given wrist involvement)  Plan:  1) Continue current dosing of vancomycin 1250mg  IV q24 with level just below goal of 15-20 and fact that vanc is not quite at steady state 2)  Recheck trough as needed   Hessie Knows, PharmD, BCPS Pager [phone] 07/05/2012 4:58 PM

## 2012-07-05 NOTE — Progress Notes (Signed)
Received notification from CCMD earlier in the shift that pt was in and out of A. Fib and SR.  This RN received messages from CCMD around 0000 that pt was tachycardic and in A. Fib.  HR in the 140's.  Obtained 12 lead EKG and showed A. Fib with RVR.  Pt's HR jumped as high as 154. BP 163/102.  Pt lying in bed at time with occasional moaning.  Notified Donnamarie Poag, NP on call and received orders for Cardizem 5mg  IV once. Will call K. Kirby back with pt's HR once IV Cardizem given.

## 2012-07-05 NOTE — Progress Notes (Signed)
[facility] PROGRESS NOTE  Kaitlyn Lucero WUJ:[id number] DOB: [birth year] DOA: 07/02/2012 PCP: [name], MD  Assessment/Plan: 1. Cellulitis of left hand: Patient is s/p hospitalization 06/20/12-06/24/12, for left wrist septic arthritis, managed with arthrocentesis/irrigation of wrist joint on 06/20/12, a swell as antibiotics, concluded on 07/01/12. Cultures were negative. She now represents with progressive swelling, pain, redness and tenderness of of the same hand/forearm, as well as restricted ROM of the wrist. Wcc was elevated at 13.5, she was afebrile, and X-rays have revealed no acute bony findings. Managing with elevation, analgesics, and broad spectrum antibiotic coverage with iv Vancomycin and Primaxin. Blood cultures are pending and show no growth. Dr Izora Ribas, hand surgeon is familiar with patient, provided a consultation, and performed a bedside joint aspiration on 07/02/12, with no appreciable fluid obtained. He has recommended continue current treatment , as well as NSAIDS. Local inflammatory phenomena have dramatically improved with only minimal erythema as of 07/04/12. Will DC Motrin after a total 3-day course, in view of low GFR. Primaxin discontinued  2. HTN (hypertension): BP is sub-optimally controlled on pre-admission antihypertensives (Metoprolol XL was halved due to bradycardia on admission). Norvasc recently added as well with improved control in blood pressures.  - continue to monitor and adjust pending further BP values.  3. PAF (paroxysmal atrial fibrillation): Patient is currently in SR, and is even mildly bradycardic, with HR of 55-56/min. Not a candidate for anticoagulation, due to dementia and high fall risk. Continued on Metoprolol XL, in reduced dose of 50 mg daily (was on 100 mg daily). Today, HR is now stable in the 70s.    4. Hypothyroidism: Continued on pre-admission thyroxine replacement therapy. - TSH within normal limits.   5. Alzheimer's dementia:   - stable but most likely contributing to sun downing and agitation.  6. UTI: Urinalysis revealed a positive sediment, with pyuria and bacteriuria, felt adequately covered with Primaxin. Culture however, revealed E. Coli. Sensitivities demonstrated ESBL. Administered a single dose of Fosfomycin to conclude UTI treatment.   Code Status: DNR.  Family Communication: No family at bedside Disposition Plan: Pending clinical condition   Consultants:  Dr. Knute Neu  Procedures:  R wrist aspiration  Antibiotics:  Vancomycin  HPI/Subjective: Patient reportedly has been more agitated today.  No new complaints otherwise.  Objective: Filed Vitals:   07/05/12 0108 07/05/12 0616 07/05/12 1011 07/05/12 1325  BP: 156/89 174/80 160/69 147/69  Pulse: 103 73 78 79  Temp:  98 F (36.7 C)  97.9 F (36.6 C)  TempSrc:  Axillary  Oral  Resp:  20  20  Height:      Weight:      SpO2:  99%  98%    Intake/Output Summary (Last 24 hours) at 07/05/12 1910 Last data filed at 07/05/12 1729  Gross per 24 hour  Intake 2814.17 ml  Output      0 ml  Net 2814.17 ml   Filed Weights   07/03/12 1100  Weight: 72.2 kg (159 lb 2.8 oz)    Exam:   General:  Pt in NAD, Alert and awake  Cardiovascular: normal s1 and s2, no murmurs  Respiratory: no wheezes, breath sounds bl  Abdomen: nd, nt  Musculoskeletal: right wrist wrapped in gauze, no active bleeding.   Data Reviewed: Basic Metabolic Panel:  Recent Labs Lab 07/02/12 1459 07/03/12 0500 07/04/12 0459 07/05/12 0451  NA 134* 136 135 135  K 4.4 3.3* 4.5 4.3  CL 98 103 105 102  CO2 24 22 21  20  GLUCOSE 132* 102* 97 102*  BUN 21 20 16 10   CREATININE 1.21* 0.93 0.83 0.74  CALCIUM 9.3 8.6 8.5 8.7   Liver Function Tests: No results found for this basename: AST, ALT, ALKPHOS, BILITOT, PROT, ALBUMIN,  in the last 168 hours No results found for this basename: LIPASE, AMYLASE,  in the last 168 hours No results found for this basename:  AMMONIA,  in the last 168 hours CBC:  Recent Labs Lab 07/02/12 1459 07/03/12 0500 07/04/12 0459 07/05/12 0700  WBC 13.5* 10.5 8.9 9.3  NEUTROABS 10.5*  --   --   --   HGB 13.1 11.0* 10.5* 13.5  HCT 38.0 33.0* 32.0* 39.0  MCV 89.6 89.2 90.4 87.6  PLT 207 250 231 297   Cardiac Enzymes: No results found for this basename: CKTOTAL, CKMB, CKMBINDEX, TROPONINI,  in the last 168 hours BNP (last 3 results) No results found for this basename: PROBNP,  in the last 8760 hours CBG: No results found for this basename: GLUCAP,  in the last 168 hours  Recent Results (from the past 240 hour(s))  URINE CULTURE     Status: None   Collection Time    07/02/12  2:43 PM      Result Value Range Status   Specimen Description URINE, CATHETERIZED   Final   Special Requests NONE   Final   Culture  Setup Time 07/02/2012 18:44   Final   Colony Count >=100,000 COLONIES/ML   Final   Culture     Final   Value: ESCHERICHIA COLI     Note: Confirmed Extended Spectrum Beta-Lactamase Producer (ESBL) CRITICAL RESULT CALLED TO, READ BACK BY AND VERIFIED WITH: ANNIE @2PM  ON 5.27 BY BUONO   Report Status 07/04/2012 FINAL   Final   Organism ID, Bacteria ESCHERICHIA COLI   Final  CULTURE, BLOOD (ROUTINE X 2)     Status: None   Collection Time    07/02/12  7:20 PM      Result Value Range Status   Specimen Description BLOOD RIGHT HAND   Final   Special Requests BOTTLES DRAWN AEROBIC ONLY 1CC   Final   Culture  Setup Time 07/03/2012 11:07   Final   Culture     Final   Value:        BLOOD CULTURE RECEIVED NO GROWTH TO DATE CULTURE WILL BE HELD FOR 5 DAYS BEFORE ISSUING A FINAL NEGATIVE REPORT   Report Status PENDING   Incomplete  MRSA PCR SCREENING     Status: None   Collection Time    07/02/12  7:21 PM      Result Value Range Status   MRSA by PCR NEGATIVE  NEGATIVE Final   Comment:            The GeneXpert MRSA Assay (FDA     approved for NASAL specimens     only), is one component of a     comprehensive  MRSA colonization     surveillance program. It is not     intended to diagnose MRSA     infection nor to guide or     monitor treatment for     MRSA infections.  CULTURE, BLOOD (ROUTINE X 2)     Status: None   Collection Time    07/02/12  7:26 PM      Result Value Range Status   Specimen Description BLOOD RIGHT HAND   Final   Special Requests BOTTLES DRAWN AEROBIC ONLY 1CC  Final   Culture  Setup Time 07/03/2012 11:07   Final   Culture     Final   Value:        BLOOD CULTURE RECEIVED NO GROWTH TO DATE CULTURE WILL BE HELD FOR 5 DAYS BEFORE ISSUING A FINAL NEGATIVE REPORT   Report Status PENDING   Incomplete     Studies: No results found.  Scheduled Meds: . amLODipine  5 mg Oral Daily  . aspirin  81 mg Oral Daily  . benazepril  40 mg Oral Daily  . feeding supplement  237 mL Oral BID  . heparin  5,000 Units Subcutaneous Q8H  . ibuprofen  200 mg Oral TID WC  . isosorbide mononitrate  15 mg Oral Daily  . levothyroxine  75 mcg Oral QAC breakfast  . metoprolol succinate  100 mg Oral q morning - 10a  . sodium chloride  3 mL Intravenous Q12H  . sodium chloride  3 mL Intravenous Q12H  . vancomycin  1,250 mg Intravenous Q24H   Continuous Infusions: . sodium chloride 50 mL/hr at 07/05/12 1731    Active Problems:   Cellulitis of left hand   HTN (hypertension)   PAF (paroxysmal atrial fibrillation)   Hypothyroidism   Alzheimer's dementia   UTI (urinary tract infection)    Time spent: > 35 minutes    Penny Pia  [facility] Pager [phone]. If 7PM-7AM, please contact night-coverage at [email or web address], password [facility] 07/05/2012, 7:10 PM  LOS: 3 days

## 2012-07-05 NOTE — Progress Notes (Signed)
Patient last void at 11am, and very small spot of urine noted on the pad, bladder scan for 237, notified Dr. Cena Benton, no new order given, continue to monitor patient.

## 2012-07-06 DIAGNOSIS — M009 Pyogenic arthritis, unspecified: Secondary | ICD-10-CM

## 2012-07-06 MED ORDER — SULFAMETHOXAZOLE-TMP DS 800-160 MG PO TABS
1.0000 | ORAL_TABLET | Freq: Two times a day (BID) | ORAL | Status: DC
  Administered 2012-07-06 – 2012-07-08 (×5): 1 via ORAL
  Filled 2012-07-06 (×8): qty 1

## 2012-07-06 NOTE — Progress Notes (Signed)
[facility] PROGRESS NOTE  Kaitlyn Lucero VWU:[id number] DOB: [birth year] DOA: 07/02/2012 PCP: [name], MD  Assessment/Plan: 1. Cellulitis of left hand: Patient is s/p hospitalization 06/20/12-06/24/12, for left wrist septic arthritis, managed with arthrocentesis/irrigation of wrist joint on 06/20/12, a swell as antibiotics, concluded on 07/01/12. Cultures were negative. She now represents with progressive swelling, pain, redness and tenderness of of the same hand/forearm, as well as restricted ROM of the wrist. Wcc was elevated at 13.5, she was afebrile, and X-rays have revealed no acute bony findings. Managing with elevation, analgesics, and broad spectrum antibiotic coverage with iv Vancomycin and Primaxin. Blood cultures are pending and show no growth.   - Start patient on Bactrim given continued improvement in erythema and tenderness. - Also obtain further lab test to rule out or consider other causes of wrist pain.  ANA, ESR, CRP, Uric acid levels  2. HTN (hypertension): BP is sub-optimally controlled on pre-admission antihypertensives (Metoprolol XL was halved due to bradycardia on admission). Norvasc recently added as well with improved control in blood pressures.  - continue to monitor and adjust pending further BP values.  3. PAF (paroxysmal atrial fibrillation): Patient is currently in SR, and is even mildly bradycardic, with HR of 55-56/min. Not a candidate for anticoagulation, due to dementia and high fall risk. Continued on Metoprolol XL, in reduced dose of 50 mg daily (was on 100 mg daily). Today, HR is now stable in the 70s.    4. Hypothyroidism: Continued on pre-admission thyroxine replacement therapy. - TSH within normal limits.   5. Alzheimer's dementia:  - stable but most likely contributing to sun downing and agitation.  6. UTI: Urinalysis revealed a positive sediment, with pyuria and bacteriuria, felt adequately covered with Primaxin. Culture however,  revealed E. Coli. Sensitivities demonstrated ESBL. Administered a single dose of Fosfomycin to conclude UTI treatment.   Code Status: DNR.  Family Communication: No family at bedside Disposition Plan: Pending clinical condition   Consultants:  Dr. Knute Neu  Procedures:  R wrist aspiration  Antibiotics:  Vancomycin  HPI/Subjective: Patient has no new complaints today.   Objective: Filed Vitals:   07/05/12 2151 07/06/12 0600 07/06/12 0928 07/06/12 1400  BP: 163/65 174/92 175/90 150/85  Pulse: 70 82 75 77  Temp: 97.9 F (36.6 C) 98.7 F (37.1 C)  98.7 F (37.1 C)  TempSrc: Axillary Axillary  Axillary  Resp: 20 20  21   Height:      Weight:      SpO2: 98% 96%  98%    Intake/Output Summary (Last 24 hours) at 07/06/12 1613 Last data filed at 07/06/12 0320  Gross per 24 hour  Intake 999.17 ml  Output      0 ml  Net 999.17 ml   Filed Weights   07/03/12 1100  Weight: 72.2 kg (159 lb 2.8 oz)    Exam:   General:  Pt in NAD, Alert and awake  Cardiovascular: normal s1 and s2, no murmurs  Respiratory: no wheezes, breath sounds bl  Abdomen: nd, nt  Musculoskeletal: right wrist wrapped in gauze, no active bleeding.   Data Reviewed: Basic Metabolic Panel:  Recent Labs Lab 07/02/12 1459 07/03/12 0500 07/04/12 0459 07/05/12 0451  NA 134* 136 135 135  K 4.4 3.3* 4.5 4.3  CL 98 103 105 102  CO2 24 22 21 20   GLUCOSE 132* 102* 97 102*  BUN 21 20 16 10   CREATININE 1.21* 0.93 0.83 0.74  CALCIUM 9.3 8.6 8.5 8.7   Liver Function Tests:  No results found for this basename: AST, ALT, ALKPHOS, BILITOT, PROT, ALBUMIN,  in the last 168 hours No results found for this basename: LIPASE, AMYLASE,  in the last 168 hours No results found for this basename: AMMONIA,  in the last 168 hours CBC:  Recent Labs Lab 07/02/12 1459 07/03/12 0500 07/04/12 0459 07/05/12 0700  WBC 13.5* 10.5 8.9 9.3  NEUTROABS 10.5*  --   --   --   HGB 13.1 11.0* 10.5* 13.5  HCT 38.0  33.0* 32.0* 39.0  MCV 89.6 89.2 90.4 87.6  PLT 207 250 231 297   Cardiac Enzymes: No results found for this basename: CKTOTAL, CKMB, CKMBINDEX, TROPONINI,  in the last 168 hours BNP (last 3 results) No results found for this basename: PROBNP,  in the last 8760 hours CBG: No results found for this basename: GLUCAP,  in the last 168 hours  Recent Results (from the past 240 hour(s))  URINE CULTURE     Status: None   Collection Time    07/02/12  2:43 PM      Result Value Range Status   Specimen Description URINE, CATHETERIZED   Final   Special Requests NONE   Final   Culture  Setup Time 07/02/2012 18:44   Final   Colony Count >=100,000 COLONIES/ML   Final   Culture     Final   Value: ESCHERICHIA COLI     Note: Confirmed Extended Spectrum Beta-Lactamase Producer (ESBL) CRITICAL RESULT CALLED TO, READ BACK BY AND VERIFIED WITH: ANNIE @2PM  ON 5.27 BY BUONO   Report Status 07/04/2012 FINAL   Final   Organism ID, Bacteria ESCHERICHIA COLI   Final  CULTURE, BLOOD (ROUTINE X 2)     Status: None   Collection Time    07/02/12  7:20 PM      Result Value Range Status   Specimen Description BLOOD RIGHT HAND   Final   Special Requests BOTTLES DRAWN AEROBIC ONLY 1CC   Final   Culture  Setup Time 07/03/2012 11:07   Final   Culture     Final   Value:        BLOOD CULTURE RECEIVED NO GROWTH TO DATE CULTURE WILL BE HELD FOR 5 DAYS BEFORE ISSUING A FINAL NEGATIVE REPORT   Report Status PENDING   Incomplete  MRSA PCR SCREENING     Status: None   Collection Time    07/02/12  7:21 PM      Result Value Range Status   MRSA by PCR NEGATIVE  NEGATIVE Final   Comment:            The GeneXpert MRSA Assay (FDA     approved for NASAL specimens     only), is one component of a     comprehensive MRSA colonization     surveillance program. It is not     intended to diagnose MRSA     infection nor to guide or     monitor treatment for     MRSA infections.  CULTURE, BLOOD (ROUTINE X 2)     Status: None    Collection Time    07/02/12  7:26 PM      Result Value Range Status   Specimen Description BLOOD RIGHT HAND   Final   Special Requests BOTTLES DRAWN AEROBIC ONLY 1CC   Final   Culture  Setup Time 07/03/2012 11:07   Final   Culture     Final   Value:  BLOOD CULTURE RECEIVED NO GROWTH TO DATE CULTURE WILL BE HELD FOR 5 DAYS BEFORE ISSUING A FINAL NEGATIVE REPORT   Report Status PENDING   Incomplete     Studies: No results found.  Scheduled Meds: . amLODipine  5 mg Oral Daily  . aspirin  81 mg Oral Daily  . benazepril  40 mg Oral Daily  . feeding supplement  237 mL Oral BID  . heparin  5,000 Units Subcutaneous Q8H  . isosorbide mononitrate  15 mg Oral Daily  . levothyroxine  75 mcg Oral QAC breakfast  . metoprolol succinate  100 mg Oral q morning - 10a  . sodium chloride  3 mL Intravenous Q12H  . sodium chloride  3 mL Intravenous Q12H  . sulfamethoxazole-trimethoprim  1 tablet Oral Q12H   Continuous Infusions: . sodium chloride 50 mL/hr at 07/05/12 1731    Active Problems:   Cellulitis of left hand   HTN (hypertension)   PAF (paroxysmal atrial fibrillation)   Hypothyroidism   Alzheimer's dementia   UTI (urinary tract infection)    Time spent: > 35 minutes    Kaitlyn Lucero  [facility] Pager [phone]. If 7PM-7AM, please contact night-coverage at [email or web address], password [facility] 07/06/2012, 4:13 PM  LOS: 4 days

## 2012-07-06 NOTE — Progress Notes (Signed)
Clinical Social Work Department BRIEF PSYCHOSOCIAL ASSESSMENT 07/06/2012  Patient:  Kaitlyn Lucero, Kaitlyn Lucero     Account Number:  [id number]     Admit date:  07/02/2012  Clinical Social Worker:  Orpah Greek  Date/Time:  07/06/2012 04:00 PM  Referred by:  Physician  Date Referred:  07/06/2012 Referred for  Other - See comment   Other Referral:   Admitted from: [facility]   Interview type:  Family Other interview type:    PSYCHOSOCIAL DATA Living Status:  FACILITY Admitted from facility:  [facility] Level of care:  Assisted Living Primary support name:  Mariselda Badalamenti (daughter) h#: [phone] c#: [phone] Primary support relationship to patient:  CHILD, ADULT Degree of support available:   good    CURRENT CONCERNS Current Concerns  Post-Acute Placement   Other Concerns:    SOCIAL WORK ASSESSMENT / PLAN CSW spoke with patient's daughter, [name] re: discharge planning. Patient was admitted from [facility]F and hopes to return there at discharge.   Assessment/plan status:  Information/Referral to [facility] Other assessment/ plan:   Information/referral to community resources:   CSW completed FL2 and faxed information to [facility] @ [facility] - awaiting call back to see if she needs to come to the hospital to assess patient for readmission or if she feels patient is ok to return.    PATIENT'S/FAMILY'S RESPONSE TO PLAN OF CARE: Patient's daughter states that patient has been a resident @ [address] for about 3 years now and has never been to a SNF, therefore if hoping that patient is ok to return to ALF at discharge.       Unice Bailey, LCSW [facility] Clinical Social Worker cell #: [phone]

## 2012-07-06 NOTE — Evaluation (Signed)
Physical Therapy Evaluation Patient Details Name: Kaitlyn Lucero MRN: [id number] DOB: [birth year] Today's Date: 07/06/2012 Time: 4098-1191 PT Time Calculation (min): 9 min  PT Assessment / Plan / Recommendation Clinical Impression  Pt is an 79 year old female admitted for L hand cellulitis with recent admission for left wrist septic arthritis, managed with arthrocentesis/irrigation of wrist joint on 06/20/12.  Pt has hx of Alzheimer's dementia and from SNF.  Pt nonsensical today and had difficultly following commands so deemed unsafe to ambulate today without +2 assist.  Per previous admission PT note, pt was ambulatory at SNF without assistive device so will follow in acute care to safely return pt to baseline mobility in preparation for d/c.    PT Assessment  Patient needs continued PT services    Follow Up Recommendations  Other (comment);SNF (f/u with SNF)    Does the patient have the potential to tolerate intense rehabilitation      Barriers to Discharge        Equipment Recommendations  None recommended by PT    Recommendations for Other Services     Frequency Min 3X/week    Precautions / Restrictions Precautions Precautions: Fall   Pertinent Vitals/Pain N/a, did not appear in distress      Mobility  Bed Mobility Bed Mobility: Supine to Sit;Sit to Supine Supine to Sit: 4: Min assist Sit to Supine: 3: Mod assist Details for Bed Mobility Assistance: assist for trunk upright and then LEs onto bed, verbal cues for technique due to poor cognition Transfers Transfers: Sit to Stand;Stand to Sit;Stand Pivot Transfers Sit to Stand: 4: Min assist;From bed;With upper extremity assist Stand to Sit: 4: Min assist;To bed Details for Transfer Assistance: min assist to rise and control descent as well as steady, verbal cues for technique as well as manual cues due to cognition esp manual cue for hip flexion as pt unable to sit down with verbal cues (thus did not sit on BSC so pivoted  back to bed for pt safety, no +2 available) Ambulation/Gait Ambulation/Gait Assistance: Not tested (comment)    Exercises     PT Diagnosis: Difficulty walking  PT Problem List: Decreased mobility;Decreased safety awareness;Decreased knowledge of use of DME;Decreased balance PT Treatment Interventions: DME instruction;Gait training;Functional mobility training;Balance training;Therapeutic activities;Therapeutic exercise;Patient/family education   PT Goals Acute Rehab PT Goals PT Goal Formulation: Patient unable to participate in goal setting Time For Goal Achievement: 07/20/12 Potential to Achieve Goals: Good Pt will go Supine/Side to Sit: with supervision PT Goal: Supine/Side to Sit - Progress: Goal set today Pt will go Sit to Supine/Side: with supervision PT Goal: Sit to Supine/Side - Progress: Goal set today Pt will go Sit to Stand: with supervision PT Goal: Sit to Stand - Progress: Goal set today Pt will go Stand to Sit: with supervision PT Goal: Stand to Sit - Progress: Goal set today Pt will Ambulate: 51 - 150 feet;with supervision;with least restrictive assistive device PT Goal: Ambulate - Progress: Goal set today  Visit Information  Last PT Received On: 07/06/12 Assistance Needed: +2    Subjective Data  Subjective: nonsensical  Patient Stated Goal: unable to state   Prior Functioning  Home Living Available Help at Discharge: Skilled Nursing Facility Type of Home: Skilled Nursing Facility Prior Function Level of Independence: Needs assistance Needs Assistance: Bathing;Dressing;Meal Prep;Light Housekeeping Comments: walked independently without assistive device PTA (per note from previous admission) Communication Communication: No difficulties    Cognition  Cognition Arousal/Alertness: Awake/alert Behavior During Therapy: [facility] for tasks  assessed/performed Overall Cognitive Status: No family/caregiver present to determine baseline cognitive functioning (hx Alz  dementia, nonsensical today)    Extremity/Trunk Assessment Right Upper Extremity Assessment RUE ROM/Strength/Tone: [facility] for tasks assessed Left Upper Extremity Assessment LUE ROM/Strength/Tone: WFL for tasks assessed (appears WFL, poor cognition unable to test) Right Lower Extremity Assessment RLE ROM/Strength/Tone: Unable to fully assess;Due to impaired cognition;Deficits RLE ROM/Strength/Tone Deficits: assist for mobility required however seemed to be more cognitively related vs due to muscle strength Left Lower Extremity Assessment LLE ROM/Strength/Tone: Unable to fully assess;Due to impaired cognition;Deficits LLE ROM/Strength/Tone Deficits: assist for mobility required however seemed to be more cognitively related vs due to muscle strength   Balance    End of Session PT - End of Session Activity Tolerance: Patient tolerated treatment well Patient left: in bed;with call bell/phone within reach;with bed alarm set Nurse Communication: Mobility status  GP     Harnoor Reta,KATHrine E 07/06/2012, 3:51 PM Zenovia Jarred, PT, DPT 07/06/2012 Pager: [phone]

## 2012-07-07 LAB — ANTI-NUCLEAR AB-TITER (ANA TITER): ANA Titer 1: NEGATIVE

## 2012-07-07 MED ORDER — HYDRALAZINE HCL 20 MG/ML IJ SOLN
10.0000 mg | Freq: Four times a day (QID) | INTRAMUSCULAR | Status: DC | PRN
  Administered 2012-07-07: 10 mg via INTRAVENOUS
  Filled 2012-07-07: qty 1

## 2012-07-07 NOTE — Progress Notes (Signed)
CSW left multiple messages for [facility] @ [facility] re: patient returning. CSW faxed updated clinicals & FL2 - awaiting call back from [facility].   Unice Bailey, LCSW [facility] Clinical Social Worker cell #: [phone]

## 2012-07-07 NOTE — Progress Notes (Signed)
[facility] PROGRESS NOTE  Kaitlyn Lucero ZOX:[id number] DOB: 1921-09-20 DOA: 07/02/2012 PCP: [name], MD  Assessment/Plan: 1. Cellulitis of left hand: Patient is s/p hospitalization 06/20/12-06/24/12, for left wrist septic arthritis, managed with arthrocentesis/irrigation of wrist joint on 06/20/12, a swell as antibiotics, concluded on 07/01/12. Cultures were negative. She now represents with progressive swelling, pain, redness and tenderness of of the same hand/forearm, as well as restricted ROM of the wrist. Wcc was elevated at 13.5, she was afebrile, and X-rays have revealed no acute bony findings. Managing with elevation, analgesics, and broad spectrum antibiotic coverage with iv Vancomycin and Primaxin. Blood cultures are pending and show no growth.   - Start patient on Bactrim given continued improvement in erythema and tenderness. - Also obtain further lab test to rule out or consider other causes of wrist pain.   - CRP and ESR elevated - Uric acid within normal limits - ANA is elevated, patient's condition is improved currently.  2. HTN (hypertension): BP is sub-optimally controlled on pre-admission antihypertensives (Metoprolol XL was halved due to bradycardia on admission). Norvasc recently added as well with improved control in blood pressures.  - continue to monitor and adjust pending further BP values.  3. PAF (paroxysmal atrial fibrillation): Patient is currently in SR, and is even mildly bradycardic, with HR of 55-56/min. Not a candidate for anticoagulation, due to dementia and high fall risk. Continued on Metoprolol XL  4. Hypothyroidism: Continued on pre-admission thyroxine replacement therapy. - TSH within normal limits.   5. Alzheimer's dementia:  - stable but most likely contributing to sun downing and agitation.  6. UTI: Urinalysis revealed a positive sediment, with pyuria and bacteriuria, felt adequately covered with Primaxin. Culture however,  revealed E. Coli. Sensitivities demonstrated ESBL. Administered a single dose of Fosfomycin to conclude UTI treatment.   Code Status: DNR.  Family Communication: No family at bedside Disposition Plan: Pending clinical condition   Consultants:  Dr. Knute Neu  Procedures:  R wrist aspiration  Antibiotics:  Vancomycin  HPI/Subjective: Patient has no new complaints today.   Objective: Filed Vitals:   07/07/12 0025 07/07/12 0214 07/07/12 0518 07/07/12 1321  BP: 201/68 152/45 168/74 156/75  Pulse: 65  70 97  Temp:   97.4 F (36.3 C) 98.3 F (36.8 C)  TempSrc:   Axillary Axillary  Resp:   20 22  Height:      Weight:      SpO2:   97% 99%    Intake/Output Summary (Last 24 hours) at 07/07/12 1523 Last data filed at 07/07/12 0700  Gross per 24 hour  Intake 771.67 ml  Output      0 ml  Net 771.67 ml   Filed Weights   07/03/12 1100  Weight: 72.2 kg (159 lb 2.8 oz)    Exam:   General:  Pt in NAD, Alert and awake  Cardiovascular: normal s1 and s2, no murmurs  Respiratory: no wheezes, breath sounds bl  Abdomen: nd, nt  Musculoskeletal: right wrist wrapped in gauze, no active bleeding.   Data Reviewed: Basic Metabolic Panel:  Recent Labs Lab 07/02/12 1459 07/03/12 0500 07/04/12 0459 07/05/12 0451  NA 134* 136 135 135  K 4.4 3.3* 4.5 4.3  CL 98 103 105 102  CO2 24 22 21 20   GLUCOSE 132* 102* 97 102*  BUN 21 20 16 10   CREATININE 1.21* 0.93 0.83 0.74  CALCIUM 9.3 8.6 8.5 8.7   Liver Function Tests: No results found for this basename: AST, ALT, ALKPHOS, BILITOT,  PROT, ALBUMIN,  in the last 168 hours No results found for this basename: LIPASE, AMYLASE,  in the last 168 hours No results found for this basename: AMMONIA,  in the last 168 hours CBC:  Recent Labs Lab 07/02/12 1459 07/03/12 0500 07/04/12 0459 07/05/12 0700  WBC 13.5* 10.5 8.9 9.3  NEUTROABS 10.5*  --   --   --   HGB 13.1 11.0* 10.5* 13.5  HCT 38.0 33.0* 32.0* 39.0  MCV 89.6 89.2  90.4 87.6  PLT 207 250 231 297   Cardiac Enzymes: No results found for this basename: CKTOTAL, CKMB, CKMBINDEX, TROPONINI,  in the last 168 hours BNP (last 3 results) No results found for this basename: PROBNP,  in the last 8760 hours CBG: No results found for this basename: GLUCAP,  in the last 168 hours  Recent Results (from the past 240 hour(s))  URINE CULTURE     Status: None   Collection Time    07/02/12  2:43 PM      Result Value Range Status   Specimen Description URINE, CATHETERIZED   Final   Special Requests NONE   Final   Culture  Setup Time 07/02/2012 18:44   Final   Colony Count >=100,000 COLONIES/ML   Final   Culture     Final   Value: ESCHERICHIA COLI     Note: Confirmed Extended Spectrum Beta-Lactamase Producer (ESBL) CRITICAL RESULT CALLED TO, READ BACK BY AND VERIFIED WITH: ANNIE @2PM  ON 5.27 BY BUONO   Report Status 07/04/2012 FINAL   Final   Organism ID, Bacteria ESCHERICHIA COLI   Final  CULTURE, BLOOD (ROUTINE X 2)     Status: None   Collection Time    07/02/12  7:20 PM      Result Value Range Status   Specimen Description BLOOD RIGHT HAND   Final   Special Requests BOTTLES DRAWN AEROBIC ONLY 1CC   Final   Culture  Setup Time 07/03/2012 11:07   Final   Culture     Final   Value:        BLOOD CULTURE RECEIVED NO GROWTH TO DATE CULTURE WILL BE HELD FOR 5 DAYS BEFORE ISSUING A FINAL NEGATIVE REPORT   Report Status PENDING   Incomplete  MRSA PCR SCREENING     Status: None   Collection Time    07/02/12  7:21 PM      Result Value Range Status   MRSA by PCR NEGATIVE  NEGATIVE Final   Comment:            The GeneXpert MRSA Assay (FDA     approved for NASAL specimens     only), is one component of a     comprehensive MRSA colonization     surveillance program. It is not     intended to diagnose MRSA     infection nor to guide or     monitor treatment for     MRSA infections.  CULTURE, BLOOD (ROUTINE X 2)     Status: None   Collection Time    07/02/12   7:26 PM      Result Value Range Status   Specimen Description BLOOD RIGHT HAND   Final   Special Requests BOTTLES DRAWN AEROBIC ONLY 1CC   Final   Culture  Setup Time 07/03/2012 11:07   Final   Culture     Final   Value:        BLOOD CULTURE RECEIVED NO GROWTH TO DATE CULTURE  WILL BE HELD FOR 5 DAYS BEFORE ISSUING A FINAL NEGATIVE REPORT   Report Status PENDING   Incomplete     Studies: No results found.  Scheduled Meds: . amLODipine  5 mg Oral Daily  . aspirin  81 mg Oral Daily  . benazepril  40 mg Oral Daily  . feeding supplement  237 mL Oral BID  . heparin  5,000 Units Subcutaneous Q8H  . isosorbide mononitrate  15 mg Oral Daily  . levothyroxine  75 mcg Oral QAC breakfast  . metoprolol succinate  100 mg Oral q morning - 10a  . sodium chloride  3 mL Intravenous Q12H  . sodium chloride  3 mL Intravenous Q12H  . sulfamethoxazole-trimethoprim  1 tablet Oral Q12H   Continuous Infusions: . sodium chloride 50 mL/hr at 07/05/12 1731    Active Problems:   Cellulitis of left hand   HTN (hypertension)   PAF (paroxysmal atrial fibrillation)   Hypothyroidism   Alzheimer's dementia   UTI (urinary tract infection)    Time spent: > 35 minutes    Penny Pia  [facility] Pager [phone]. If 7PM-7AM, please contact night-coverage at [email or web address], password [facility] 07/07/2012, 3:23 PM  LOS: 5 days

## 2012-07-07 NOTE — Progress Notes (Signed)
nickeah from [facility] here and states they need fl2 with med list and need order a w/c due to her not walking at present. States they can not take her this weekend due to this. Need order for pt and ot there.Marland Kitchen

## 2012-07-08 MED ORDER — SULFAMETHOXAZOLE-TMP DS 800-160 MG PO TABS: 1.0000 | ORAL_TABLET | Freq: Two times a day (BID) | ORAL | Status: DC

## 2012-07-08 MED ORDER — AMLODIPINE BESYLATE 10 MG PO TABS: 10.0000 mg | ORAL_TABLET | Freq: Every day | ORAL | Status: AC

## 2012-07-08 NOTE — Progress Notes (Signed)
Per MD, Pt ready for d/c.  Notified RN, Pt, family and facility.  Sent d/c summary and FL2.  Confirmed receipt of d/c summary and FL2.  Facility ready to receive Pt.  Arranged for transportation.  Amanda Jediah Horger, LCSWA Clinical Social Work [phone]   

## 2012-07-08 NOTE — Discharge Summary (Addendum)
Physician Discharge Summary  Alvie Speltz UJW:[id number] DOB: 08-24-1944 DOA: 07/02/2012  PCP: [name], MD  Admit date: 07/02/2012 Discharge date: 07/08/2012  Time spent: > 35 minutes  Recommendations for Outpatient Follow-up:  1. Please consider working patient up for rheumatological condition given her elevated ANA and wrist discomfort 2. Also continue to monitor wrist and ensure that patient continues improvement and consider extending bactrim antibiotic if clinical condition warrants it. 3. Monitor blood pressures and adjust antihypertensive medication as needed.  Discharge Diagnoses:  Active Problems:   Cellulitis of left hand   HTN (hypertension)   PAF (paroxysmal atrial fibrillation)   Hypothyroidism   Alzheimer's dementia   UTI (urinary tract infection)   Discharge Condition: Stable   Diet recommendation: low sodium/heart healthy. Patient is on feeding supplement Ensure 237 ml po BID  Filed Weights   07/03/12 1100  Weight: 72.2 kg (159 lb 2.8 oz)    History of present illness:  79 y/o CF with history of alzheimer's dementia, htn, PAF who presented to the ED complaining of progressive pain and redness of left hand  Hospital Course:   1. Cellulitis of left hand: Patient is s/p hospitalization 06/20/12-06/24/12, for left wrist septic arthritis, managed with arthrocentesis/irrigation of wrist joint on 06/20/12, a swell as antibiotics, concluded on 07/01/12. Cultures were negative. She now represents with progressive swelling, pain, redness and tenderness of of the same hand/forearm, as well as restricted ROM of the wrist. Wcc was elevated at 13.5, she was afebrile, and X-rays have revealed no acute bony findings. Managing with elevation, analgesics, and broad spectrum antibiotic coverage with iv Vancomycin and Primaxin. Blood cultures are pending and show no growth.  - Started patient on Bactrim given continued improvement in erythema and tenderness. Will  discharge with script for Bactrim for 5 more days of treatment.  (has had 7 days of treatment while in house) - Also obtain further lab test to rule out or consider other causes of wrist pain.  - CRP and ESR elevated  - Uric acid within normal limits  - ANA is elevated, patient's condition is improved currently. Would recommend further work up as outpatient.  2. HTN (hypertension): BP is sub-optimally controlled on pre-admission antihypertensives  - continue to monitor and adjust pending further BP values.  - norvasc added recently but I will have to increase dose as patient's blood pressure was still suboptimally controlled.  3. PAF (paroxysmal atrial fibrillation): Not a candidate for anticoagulation, due to dementia and high fall risk. Continued on Metoprolol XL   4. Hypothyroidism: Continued on pre-admission thyroxine replacement therapy.  - TSH within normal limits.   5. Alzheimer's dementia:  - stable but most likely contributing to sun downing and agitation.   6. UTI: Grew out E coli - Has been treated adequately while in house   Procedures:  None  Consultations:  Ortho  Discharge Exam: Filed Vitals:   07/07/12 1321 07/07/12 2145 07/08/12 0529 07/08/12 0708  BP: 156/75 168/74 205/69 175/58  Pulse: 97 79 72 64  Temp: 98.3 F (36.8 C) 98.1 F (36.7 C) 97.3 F (36.3 C)   TempSrc: Axillary Axillary Axillary   Resp: 22 20 22    Height:      Weight:      SpO2: 99% 99% 97%     General: Pt in NAD, pleasantly confused Cardiovascular: normal s1 and s2, no mrg Respiratory: CTA BL, no wheezes  Discharge Instructions  Discharge Orders   Future Orders Complete By Expires  Call MD for:  persistant nausea and vomiting  As directed     Call MD for:  redness, tenderness, or signs of infection (pain, swelling, redness, odor or green/yellow discharge around incision site)  As directed     Call MD for:  severe uncontrolled pain  As directed     Call MD for:  temperature  >100.4  As directed     Diet - low sodium heart healthy  As directed     Discharge instructions  As directed     Comments:      Please have patient follow up with PCP at facility and evaluate wrist.  Patient will need continued monitoring of wrist.    Increase activity slowly  As directed         Medication List    TAKE these medications       acetaminophen 325 MG tablet  Commonly known as:  TYLENOL  Take 650 mg by mouth every 4 (four) hours as needed (headache).     amLODipine 10 MG tablet  Commonly known as:  NORVASC  Take 1 tablet (10 mg total) by mouth daily.     aspirin 81 MG chewable tablet  Chew 81 mg by mouth daily.     benazepril 40 MG tablet  Commonly known as:  LOTENSIN  Take 40 mg by mouth daily.     bisacodyl 10 MG suppository  Commonly known as:  DULCOLAX  Place 10 mg rectally daily as needed for constipation.     feeding supplement Liqd  Take 237 mLs by mouth 2 (two) times daily. Vanilla flavor     guaiFENesin-dextromethorphan 100-10 MG/5ML syrup  Commonly known as:  ROBITUSSIN DM  Take 10 mLs by mouth 3 (three) times daily as needed for cough.     isosorbide mononitrate 30 MG 24 hr tablet  Commonly known as:  IMDUR  Take 15 mg by mouth daily.     levothyroxine 75 MCG tablet  Commonly known as:  SYNTHROID, LEVOTHROID  Take 75 mcg by mouth daily before breakfast.     metoprolol succinate 100 MG 24 hr tablet  Commonly known as:  TOPROL-XL  Take 100 mg by mouth every morning. Take with or immediately following a meal.     ondansetron 4 MG tablet  Commonly known as:  ZOFRAN  Take 4 mg by mouth every 8 (eight) hours as needed for nausea.     sulfamethoxazole-trimethoprim 800-160 MG per tablet  Commonly known as:  BACTRIM DS  Take 1 tablet by mouth every 12 (twelve) hours.     traMADol 50 MG tablet  Commonly known as:  ULTRAM  Take 1 tablet (50 mg total) by mouth every 6 (six) hours as needed for pain.       Allergies  Allergen Reactions  .  Amoxicillin-Pot Clavulanate Diarrhea  . Nitrofurantoin Diarrhea    Family and Per nursing home mar  . Banana Flavor Rash  . Codeine Palpitations    Family states not a true allergy, Per nursing home mar  . Lidocaine Palpitations    Family states not a true allergy, Per nursing home mar      The results of significant diagnostics from this hospitalization (including imaging, microbiology, ancillary and laboratory) are listed below for reference.    Significant Diagnostic Studies: Dg Wrist Complete Left  07/14/2012   *RADIOLOGY REPORT*  Clinical Data: Pain, redness, swelling, heat throughout the hand and wrist.  History of septic arthritis.  LEFT WRIST - COMPLETE  3+ VIEW  Comparison: 06/20/2012  Findings: Bones appear radiolucent. There are marked degenerate changes throughout the carpals, especially involving the first carpometacarpal joint.  No evidence for acute fracture or subluxation.  IMPRESSION: Stable appearance of the wrist.   Original Report Authenticated By: Norva Pavlov, M.D.   Dg Chest Port 1 View  06/21/2012   *RADIOLOGY REPORT*  Clinical Data: Cough, congestion.  PORTABLE CHEST - 1 VIEW  Comparison: 04/08/2012  Findings: Cardiomegaly. There is hyperinflation of the lungs compatible with COPD.  Calcified granuloma the right lower lung. No acute opacities or effusions.  Chronic increased markings throughout the lungs.  No acute bony abnormality.  IMPRESSION: COPD/chronic changes.  Cardiomegaly.  No acute findings.   Original Report Authenticated By: Charlett Nose, M.D.   Dg Hand Complete Left  07/02/2012   *RADIOLOGY REPORT*  Clinical Data: Pain, swelling, redness, heat in the hand and wrist. History of septic arthritis.  LEFT HAND - COMPLETE 3+ VIEW  Comparison: 06/20/2012  Findings: There are marked degenerative changes in the interphalangeal joints and wrist.  No evidence for acute fracture or subluxation.  No radiopaque foreign body or soft tissue gas.  IMPRESSION:  1.   Significant degenerative changes. 2. No evidence for acute  abnormality.   Original Report Authenticated By: Norva Pavlov, M.D.   Dg Hand Complete Left  06/20/2012   *RADIOLOGY REPORT*  Clinical Data: Hand pain.  LEFT HAND - COMPLETE 3+ VIEW  Comparison: None.  Findings: Advanced degenerative changes within the left wrist. Degenerative changes in the IP joints.  Diffuse bone demineralization.  No acute fracture, subluxation or dislocation visualized.  Soft tissues are intact.  IMPRESSION: Advanced degenerative changes as above.  Bony mineralization.  No acute findings.   Original Report Authenticated By: Charlett Nose, M.D.    Microbiology: Recent Results (from the past 240 hour(s))  URINE CULTURE     Status: None   Collection Time    07/02/12  2:43 PM      Result Value Range Status   Specimen Description URINE, CATHETERIZED   Final   Special Requests NONE   Final   Culture  Setup Time 07/02/2012 18:44   Final   Colony Count >=100,000 COLONIES/ML   Final   Culture     Final   Value: ESCHERICHIA COLI     Note: Confirmed Extended Spectrum Beta-Lactamase Producer (ESBL) CRITICAL RESULT CALLED TO, READ BACK BY AND VERIFIED WITH: ANNIE @2PM  ON 5.27 BY BUONO   Report Status 07/04/2012 FINAL   Final   Organism ID, Bacteria ESCHERICHIA COLI   Final  CULTURE, BLOOD (ROUTINE X 2)     Status: None   Collection Time    07/02/12  7:20 PM      Result Value Range Status   Specimen Description BLOOD RIGHT HAND   Final   Special Requests BOTTLES DRAWN AEROBIC ONLY 1CC   Final   Culture  Setup Time 07/03/2012 11:07   Final   Culture     Final   Value:        BLOOD CULTURE RECEIVED NO GROWTH TO DATE CULTURE WILL BE HELD FOR 5 DAYS BEFORE ISSUING A FINAL NEGATIVE REPORT   Report Status PENDING   Incomplete  MRSA PCR SCREENING     Status: None   Collection Time    07/02/12  7:21 PM      Result Value Range Status   MRSA by PCR NEGATIVE  NEGATIVE Final   Comment:  The GeneXpert MRSA Assay  (FDA     approved for NASAL specimens     only), is one component of a     comprehensive MRSA colonization     surveillance program. It is not     intended to diagnose MRSA     infection nor to guide or     monitor treatment for     MRSA infections.  CULTURE, BLOOD (ROUTINE X 2)     Status: None   Collection Time    07/02/12  7:26 PM      Result Value Range Status   Specimen Description BLOOD RIGHT HAND   Final   Special Requests BOTTLES DRAWN AEROBIC ONLY 1CC   Final   Culture  Setup Time 07/03/2012 11:07   Final   Culture     Final   Value:        BLOOD CULTURE RECEIVED NO GROWTH TO DATE CULTURE WILL BE HELD FOR 5 DAYS BEFORE ISSUING A FINAL NEGATIVE REPORT   Report Status PENDING   Incomplete     Labs: Basic Metabolic Panel:  Recent Labs Lab 07/02/12 1459 07/03/12 0500 07/04/12 0459 07/05/12 0451  NA 134* 136 135 135  K 4.4 3.3* 4.5 4.3  CL 98 103 105 102  CO2 24 22 21 20   GLUCOSE 132* 102* 97 102*  BUN 21 20 16 10   CREATININE 1.21* 0.93 0.83 0.74  CALCIUM 9.3 8.6 8.5 8.7   Liver Function Tests: No results found for this basename: AST, ALT, ALKPHOS, BILITOT, PROT, ALBUMIN,  in the last 168 hours No results found for this basename: LIPASE, AMYLASE,  in the last 168 hours No results found for this basename: AMMONIA,  in the last 168 hours CBC:  Recent Labs Lab 07/02/12 1459 07/03/12 0500 07/04/12 0459 07/05/12 0700  WBC 13.5* 10.5 8.9 9.3  NEUTROABS 10.5*  --   --   --   HGB 13.1 11.0* 10.5* 13.5  HCT 38.0 33.0* 32.0* 39.0  MCV 89.6 89.2 90.4 87.6  PLT 207 250 231 297   Cardiac Enzymes: No results found for this basename: CKTOTAL, CKMB, CKMBINDEX, TROPONINI,  in the last 168 hours BNP: BNP (last 3 results) No results found for this basename: PROBNP,  in the last 8760 hours CBG: No results found for this basename: GLUCAP,  in the last 168 hours     Signed:  Penny Pia  [facility] 07/08/2012, 12:53 PM

## 2012-07-09 LAB — CULTURE, BLOOD (ROUTINE X 2): Culture: NO GROWTH

## 2012-08-07 ENCOUNTER — Emergency Department (HOSPITAL_COMMUNITY): Payer: Medicare Other

## 2012-08-07 ENCOUNTER — Encounter (HOSPITAL_COMMUNITY): Payer: Self-pay | Admitting: *Deleted

## 2012-08-07 ENCOUNTER — Inpatient Hospital Stay (HOSPITAL_COMMUNITY)
Admission: EM | Admit: 2012-08-07 | Discharge: 2012-08-09 | DRG: 871 | Disposition: A | Discharge status: Skilled Nursing Facility | Payer: Medicare Other | Attending: Internal Medicine | Admitting: Internal Medicine

## 2012-08-07 DIAGNOSIS — Z66 Do not resuscitate: Secondary | ICD-10-CM | POA: Diagnosis present

## 2012-08-07 DIAGNOSIS — E039 Hypothyroidism, unspecified: Secondary | ICD-10-CM

## 2012-08-07 DIAGNOSIS — M009 Pyogenic arthritis, unspecified: Secondary | ICD-10-CM

## 2012-08-07 DIAGNOSIS — G9340 Encephalopathy, unspecified: Secondary | ICD-10-CM | POA: Diagnosis present

## 2012-08-07 DIAGNOSIS — Z888 Allergy status to other drugs, medicaments and biological substances status: Secondary | ICD-10-CM

## 2012-08-07 DIAGNOSIS — N179 Acute kidney failure, unspecified: Secondary | ICD-10-CM | POA: Diagnosis present

## 2012-08-07 DIAGNOSIS — D72829 Elevated white blood cell count, unspecified: Secondary | ICD-10-CM

## 2012-08-07 DIAGNOSIS — G309 Alzheimer's disease, unspecified: Secondary | ICD-10-CM | POA: Diagnosis present

## 2012-08-07 DIAGNOSIS — A419 Sepsis, unspecified organism: Secondary | ICD-10-CM | POA: Diagnosis present

## 2012-08-07 DIAGNOSIS — L03114 Cellulitis of left upper limb: Secondary | ICD-10-CM

## 2012-08-07 DIAGNOSIS — I10 Essential (primary) hypertension: Secondary | ICD-10-CM | POA: Diagnosis present

## 2012-08-07 DIAGNOSIS — I4891 Unspecified atrial fibrillation: Secondary | ICD-10-CM | POA: Diagnosis present

## 2012-08-07 DIAGNOSIS — I480 Paroxysmal atrial fibrillation: Secondary | ICD-10-CM

## 2012-08-07 DIAGNOSIS — Z87440 Personal history of urinary (tract) infections: Secondary | ICD-10-CM

## 2012-08-07 DIAGNOSIS — Z885 Allergy status to narcotic agent status: Secondary | ICD-10-CM

## 2012-08-07 DIAGNOSIS — N390 Urinary tract infection, site not specified: Secondary | ICD-10-CM | POA: Diagnosis present

## 2012-08-07 DIAGNOSIS — Z881 Allergy status to other antibiotic agents status: Secondary | ICD-10-CM

## 2012-08-07 DIAGNOSIS — F0280 Dementia in other diseases classified elsewhere without behavioral disturbance: Secondary | ICD-10-CM | POA: Diagnosis present

## 2012-08-07 DIAGNOSIS — R4182 Altered mental status, unspecified: Secondary | ICD-10-CM

## 2012-08-07 DIAGNOSIS — R5081 Fever presenting with conditions classified elsewhere: Secondary | ICD-10-CM

## 2012-08-07 DIAGNOSIS — E869 Volume depletion, unspecified: Secondary | ICD-10-CM | POA: Diagnosis present

## 2012-08-07 LAB — COMPREHENSIVE METABOLIC PANEL
Alkaline Phosphatase: 74 U/L (ref 39–117)
BUN: 21 mg/dL (ref 6–23)
CO2: 18 mEq/L — ABNORMAL LOW (ref 19–32)
Chloride: 103 mEq/L (ref 96–112)
GFR calc Af Amer: 37 mL/min — ABNORMAL LOW (ref >90)
GFR calc non Af Amer: 32 mL/min — ABNORMAL LOW (ref >90)
Glucose, Bld: 110 mg/dL — ABNORMAL HIGH (ref 70–99)
Potassium: 4.4 mEq/L (ref 3.5–5.1)
Total Bilirubin: 0.7 mg/dL (ref 0.3–1.2)

## 2012-08-07 LAB — URINALYSIS, ROUTINE W REFLEX MICROSCOPIC
Bilirubin Urine: NEGATIVE
Ketones, ur: NEGATIVE mg/dL
Nitrite: POSITIVE — AB
Protein, ur: NEGATIVE mg/dL

## 2012-08-07 LAB — CBC WITH DIFFERENTIAL/PLATELET
Eosinophils Absolute: 0 10*3/uL (ref 0.0–0.7)
Hemoglobin: 12.5 g/dL (ref 12.0–15.0)
Lymphocytes Relative: 13 % (ref 12–46)
Lymphs Abs: 1.6 10*3/uL (ref 0.7–4.0)
MCH: 30.5 pg (ref 26.0–34.0)
Monocytes Relative: 5 % (ref 3–12)
Neutro Abs: 9.9 10*3/uL — ABNORMAL HIGH (ref 1.7–7.7)
Neutrophils Relative %: 82 % — ABNORMAL HIGH (ref 43–77)
RBC: 4.1 MIL/uL (ref 3.87–5.11)

## 2012-08-07 LAB — URINE MICROSCOPIC-ADD ON

## 2012-08-07 MED ORDER — SODIUM CHLORIDE 0.9 % IV BOLUS (SEPSIS)
1000.0000 mL | Freq: Once | INTRAVENOUS | Status: AC
  Administered 2012-08-07: 1000 mL via INTRAVENOUS

## 2012-08-07 MED ORDER — DEXTROSE 5 % IV SOLN
1.0000 g | Freq: Once | INTRAVENOUS | Status: DC
  Administered 2012-08-07: 1 g via INTRAVENOUS
  Filled 2012-08-07: qty 10

## 2012-08-07 MED ORDER — LORAZEPAM 2 MG/ML IJ SOLN: 0.2500 mg | Freq: Two times a day (BID) | INTRAMUSCULAR | Status: DC | PRN

## 2012-08-07 MED ORDER — LORAZEPAM 0.5 MG PO TABS: 0.2500 mg | ORAL_TABLET | Freq: Two times a day (BID) | ORAL | Status: DC | PRN

## 2012-08-07 MED ORDER — ENOXAPARIN SODIUM 30 MG/0.3ML ~~LOC~~ SOLN
30.0000 mg | SUBCUTANEOUS | Status: DC
  Administered 2012-08-07 – 2012-08-08 (×2): 30 mg via SUBCUTANEOUS
  Filled 2012-08-07 (×3): qty 0.3

## 2012-08-07 MED ORDER — SODIUM CHLORIDE 0.9 % IV SOLN
Freq: Once | INTRAVENOUS | Status: AC
  Administered 2012-08-07: 18:00:00 via INTRAVENOUS

## 2012-08-07 MED ORDER — AMLODIPINE BESYLATE 10 MG PO TABS
10.0000 mg | ORAL_TABLET | Freq: Every day | ORAL | Status: DC
  Administered 2012-08-07 – 2012-08-09 (×3): 10 mg via ORAL
  Filled 2012-08-07 (×3): qty 1

## 2012-08-07 MED ORDER — ONDANSETRON HCL 4 MG/2ML IJ SOLN: 4.0000 mg | Freq: Four times a day (QID) | INTRAMUSCULAR | Status: DC | PRN

## 2012-08-07 MED ORDER — ONDANSETRON HCL 4 MG PO TABS: 4.0000 mg | ORAL_TABLET | Freq: Four times a day (QID) | ORAL | Status: DC | PRN

## 2012-08-07 MED ORDER — ISOSORBIDE MONONITRATE 15 MG HALF TABLET
15.0000 mg | ORAL_TABLET | Freq: Every day | ORAL | Status: DC
  Administered 2012-08-07 – 2012-08-08 (×2): 15 mg via ORAL
  Filled 2012-08-07 (×3): qty 1

## 2012-08-07 MED ORDER — HYDRALAZINE HCL 20 MG/ML IJ SOLN: 5.0000 mg | INTRAMUSCULAR | Status: DC | PRN

## 2012-08-07 MED ORDER — ACETAMINOPHEN 325 MG PO TABS: 650.0000 mg | ORAL_TABLET | Freq: Four times a day (QID) | ORAL | Status: DC | PRN

## 2012-08-07 MED ORDER — DEXTROSE 5 % IV SOLN
1.0000 g | INTRAVENOUS | Status: DC
  Administered 2012-08-08: 1 g via INTRAVENOUS
  Filled 2012-08-07 (×2): qty 10

## 2012-08-07 MED ORDER — ENSURE COMPLETE PO LIQD
237.0000 mL | Freq: Two times a day (BID) | ORAL | Status: DC
  Administered 2012-08-07 – 2012-08-09 (×4): 237 mL via ORAL

## 2012-08-07 MED ORDER — SODIUM CHLORIDE 0.9 % IV SOLN: INTRAVENOUS | Status: DC

## 2012-08-07 MED ORDER — METOPROLOL SUCCINATE ER 100 MG PO TB24
100.0000 mg | ORAL_TABLET | Freq: Every day | ORAL | Status: DC
  Filled 2012-08-07 (×3): qty 1

## 2012-08-07 MED ORDER — LEVOTHYROXINE SODIUM 75 MCG PO TABS
75.0000 ug | ORAL_TABLET | Freq: Every day | ORAL | Status: DC
  Administered 2012-08-08 – 2012-08-09 (×2): 75 ug via ORAL
  Filled 2012-08-07 (×3): qty 1

## 2012-08-07 MED ORDER — ACETAMINOPHEN 650 MG RE SUPP: 650.0000 mg | Freq: Four times a day (QID) | RECTAL | Status: DC | PRN

## 2012-08-07 MED ORDER — ASPIRIN 81 MG PO CHEW
81.0000 mg | CHEWABLE_TABLET | Freq: Every day | ORAL | Status: DC
  Administered 2012-08-07 – 2012-08-09 (×3): 81 mg via ORAL
  Filled 2012-08-07 (×5): qty 1

## 2012-08-07 MED ORDER — SODIUM CHLORIDE 0.9 % IV SOLN
INTRAVENOUS | Status: DC
  Administered 2012-08-07: 23:00:00 via INTRAVENOUS
  Administered 2012-08-08: 1000 mL via INTRAVENOUS
  Administered 2012-08-09: 06:00:00 via INTRAVENOUS

## 2012-08-07 NOTE — ED Notes (Signed)
Per facility patient was sitting outside and when staff went to check on her she was extremely weak and was not following commands, patient initially not verbal with staff, upon EMS arrival patient able to move more on her own, patient lives on alzheimer's unit and cannot cooperate with questions or assessment

## 2012-08-07 NOTE — ED Notes (Signed)
Spoke with Dr. Melodie Bouillon regarding daughter, [name]'s request to NOT PLACE pt in telemetry bed. Dr. Melodie Bouillon to change bed request to nontelemetry bed.

## 2012-08-07 NOTE — ED Notes (Signed)
X-ray at bedside

## 2012-08-07 NOTE — ED Notes (Signed)
Patient transported to CT 

## 2012-08-07 NOTE — ED Notes (Signed)
Returned from ct 

## 2012-08-07 NOTE — ED Notes (Signed)
Please call [name] [phone] or cell phone [phone]

## 2012-08-07 NOTE — Progress Notes (Signed)
Pt arrived from ed via stretcher. Pt wa put in bed. Vs have been taken.Pt is very confused. RN attempted to orient pt to room.

## 2012-08-07 NOTE — ED Notes (Signed)
Per EMS 120

## 2012-08-07 NOTE — ED Notes (Addendum)
Daughter, who is Medical POA states that patient is a full DNR and does not want the pt to go to a telemetry floor if pt gets admitted.  Also, other daughter is dual POA and may be told pf pt's medical status.

## 2012-08-07 NOTE — ED Provider Notes (Signed)
History    CSN: [id number] Arrival date & time 08/07/12  1258  First MD Initiated Contact with Patient 08/07/12 1314     Chief Complaint  Patient presents with  . Shortness of Breath  . Fatigue   (Consider location/radiation/quality/duration/timing/severity/associated sxs/prior Treatment) HPI  Kaitlyn Lucero Is an 79 year old female with Alzheimer's dementia.  There is a level V caveat because of patient altered mental status.  Patient is brought in by EMS from her assisted living facility.  Per nursing notes the patient was sitting outside when the staff checked on her she stood up and became extremely weak.  She stated that she was very short of breath.  The patient became nonverbal.  Staff states this is different from her baseline dementia and confusion.  EMS was called and patient transported here to the emergency department for evaluation.  Patient was recently admitted Past Medical History  Diagnosis Date  . Atrial fibrillation   . Unspecified essential hypertension   . Alzheimer disease   . UTI (urinary tract infection)   . Septic arthritis of wrist, left 06/2012   Past Surgical History  Procedure Laterality Date  . Bladder suspension  1953  . Cesarean section  1961  . Abdominal hysterectomy  1964  . Bladder surgery  1977  . Cataract extraction  2003    left eye  . Irrigation and debridement abscess Left 06/21/2012    Procedure: irrigation and debridement left wrist;  Surgeon: Johnette Abraham, MD;  Location: [facility];  Service: Plastics;  Laterality: Left;  left wrist   Family History  Problem Relation Age of Onset  . Other Mother     lung and heart problems  . Other Father     pace maker, heart problems  . Hypertension Brother   . Other Brother     CABG x3  . Hypertension Sister    History  Substance Use Topics  . Smoking status: Never Smoker   . Smokeless tobacco: Never Used  . Alcohol Use: No   OB History   Grav Para Term Preterm Abortions TAB SAB Ect  Mult Living                 Review of Systems  Unable to perform ROS   Allergies  Amoxicillin-pot clavulanate; Nitrofurantoin; Banana flavor; Codeine; and Lidocaine  Home Medications   Current Outpatient Rx  Name  Route  Sig  Dispense  Refill  . amLODipine (NORVASC) 10 MG tablet   Oral   Take 1 tablet (10 mg total) by mouth daily.   30 tablet   0   . aspirin 81 MG chewable tablet   Oral   Chew 81 mg by mouth daily.         . benazepril (LOTENSIN) 40 MG tablet   Oral   Take 40 mg by mouth daily.         . feeding supplement (ENSURE IMMUNE HEALTH) LIQD   Oral   Take 237 mLs by mouth 2 (two) times daily. Vanilla flavor          . isosorbide mononitrate (IMDUR) 30 MG 24 hr tablet   Oral   Take 15 mg by mouth daily.         Marland Kitchen levothyroxine (SYNTHROID, LEVOTHROID) 75 MCG tablet   Oral   Take 75 mcg by mouth daily before breakfast.          . metoprolol succinate (TOPROL-XL) 100 MG 24 hr tablet   Oral  Take 100 mg by mouth every morning. Take with or immediately following a meal.          BP 112/47  Pulse 59  Resp 24  SpO2 98% Physical Exam Physical Exam  Nursing note and vitals reviewed. Constitutional: elderly female in NAD  HENT:  Head: Normocephalic and atraumatic.  Eyes: Conjunctivae normal and EOM are normal. Pupils are equal, round, and reactive to light. No scleral icterus.  Neck: Normal range of motion.  Cardiovascular: Normal rate, regular rhythm and normal heart sounds.  Exam reveals no gallop and no friction rub.   No murmur heard. Pulmonary/Chest: Effort normal and breath sounds normal. No respiratory distress.  Abdominal: Soft. Bowel sounds are normal. She exhibits no distension and no mass. There is no tenderness. There is no guarding.  Neurological: She is alert and intermittently answers questions. Skin: Skin is warm and dry. She is not diaphoretic.    ED Course  Procedures (including critical care time) Labs Reviewed  CBC  WITH DIFFERENTIAL - Abnormal; Notable for the following:    WBC 12.1 (*)    HCT 35.4 (*)    Neutrophils Relative % 82 (*)    Neutro Abs 9.9 (*)    All other components within normal limits  COMPREHENSIVE METABOLIC PANEL - Abnormal; Notable for the following:    CO2 18 (*)    Glucose, Bld 110 (*)    Creatinine, Ser 1.41 (*)    GFR calc non Af Amer 32 (*)    GFR calc Af Amer 37 (*)    All other components within normal limits  URINALYSIS, ROUTINE W REFLEX MICROSCOPIC - Abnormal; Notable for the following:    Color, Urine AMBER (*)    APPearance CLOUDY (*)    Nitrite POSITIVE (*)    Leukocytes, UA MODERATE (*)    All other components within normal limits  URINE MICROSCOPIC-ADD ON - Abnormal; Notable for the following:    Bacteria, UA MANY (*)    All other components within normal limits  URINE CULTURE  POCT I-STAT TROPONIN I   Ct Head Wo Contrast  08/07/2012   *RADIOLOGY REPORT*  Clinical Data: Severe headache.  Altered mental status  CT HEAD WITHOUT CONTRAST  Technique:  Contiguous axial images were obtained from the base of the skull through the vertex without contrast.  Comparison: CT 04/07/2012  Findings: Image quality degraded by significant motion.  Multiple images were repeated due to motion.  Moderate atrophy.  Chronic microvascular ischemic change in the white matter.  Chronic lacunar infarction in the left lateral basal ganglia is unchanged.  Allowing for motion, no acute infarct hemorrhage or mass lesion identified.  No focal skull lesion.  IMPRESSION: Image quality degraded by motion.  No acute abnormality is identified.   Original Report Authenticated By: Janeece Riggers, M.D.   Dg Chest Port 1 View  08/07/2012   *RADIOLOGY REPORT*  Clinical Data: 79 year old female shortness of breath weakness confusion.  PORTABLE CHEST - 1 VIEW  Comparison: 06/21/2012 and earlier.  Findings: AP portable semi upright view at 1355 hours.  Stable lung volumes.  Stable cardiac size and mediastinal  contours. Breast attenuation artifact projecting over the central lower mediastinum. No pneumothorax or pulmonary edema.  No pleural effusion or consolidation.  IMPRESSION: Breast attenuation artifact felt responsible for the increased density over the lower mediastinum. No acute cardiopulmonary abnormality identified.   Original Report Authenticated By: Erskine Speed, M.D.   1. UTI (lower urinary tract infection)   2.  AKI (acute kidney injury)   3. Altered mental state   4. Leukocytosis     MDM  5:31 PM BP 166/89  Pulse 79  Resp 23  SpO2 99% Patient with UTI,  Creatinine has doubled Elevated white count. I will begin with rocephin IV.  Patient wil be admitted to tele floor by [email or web address] for obs.  Arthor Captain, PA-C 08/07/12 1752

## 2012-08-07 NOTE — H&P (Signed)
[facility] History and Physical  Tonita Bills AVW:[id number] DOB: 27-Jun-1921 DOA: 08/07/2012  Referring physician: Dr Denton Lank PCP: [name], MD  Specialists: none  Chief Complaint: AMS  HPI: Kaitlyn Lucero is a 79 y.o. female ALF/[facility] resident with past medical history as listed below including recurrent UTIs, Alzheimer's dementia paroxysmal Atrial fibrillation presents with above complaints. History is obtained from her daughter and chart reviewed. It is reported that patient had been sitting outside in the heat, and when staff checked on her and set her up she became very weak and stated that she felt short of breath. They report that she is usually talkative, but following that they state that she became less talkative which is different from her baseline of dementia/confusion. In the ED patient was initially hypotensive 96/46 with a pulse of 59 but on followup check she had a blood pressure of 174/106 with a pulse of 81. Urinalysis was consistent with a UTI, white cell count of 12.1. She was started on empiric antibiotics and is admitted for further evaluation and management . As documented per RN note on Epic daughter who is POA states the patient is a DO NOT RESUSCITATE and she does not want to the patient going to a telemetry bed.   Review of Systems: As per history of present illness, otherwise unable to obtain secondary to mental status  Past Medical History  Diagnosis Date  . Atrial fibrillation   . Unspecified essential hypertension   . Alzheimer disease   . UTI (urinary tract infection)   . Septic arthritis of wrist, left 06/2012   Past Surgical History  Procedure Laterality Date  . Bladder suspension  1953  . Cesarean section  1961  . Abdominal hysterectomy  1964  . Bladder surgery  1977  . Cataract extraction  2003    left eye  . Irrigation and debridement abscess Left 06/21/2012    Procedure: irrigation and debridement left wrist;   Surgeon: Johnette Abraham, MD;  Location: [facility];  Service: Plastics;  Laterality: Left;  left wrist   Social History:  reports that she has never smoked. She has never used smokeless tobacco. She reports that she does not drink alcohol or use illicit drugs.  where does patient live-ALF   Allergies  Allergen Reactions  . Amoxicillin-Pot Clavulanate Diarrhea  . Nitrofurantoin Diarrhea    Family and Per nursing home mar  . Banana Flavor Rash  . Codeine Palpitations    Family states not a true allergy, Per nursing home mar  . Lidocaine Palpitations    Family states not a true allergy, Per nursing home mar    Family History  Problem Relation Age of Onset  . Other Mother     lung and heart problems  . Other Father     pace maker, heart problems  . Hypertension Brother   . Other Brother     CABG x3  . Hypertension Sister    Prior to Admission medications   Medication Sig Start Date End Date Taking? Authorizing Provider  amLODipine (NORVASC) 10 MG tablet Take 1 tablet (10 mg total) by mouth daily. 07/08/12  Yes Penny Pia, MD  aspirin 81 MG chewable tablet Chew 81 mg by mouth daily.   Yes Historical Provider, MD  benazepril (LOTENSIN) 40 MG tablet Take 40 mg by mouth daily.   Yes Historical Provider, MD  feeding supplement (ENSURE IMMUNE HEALTH) LIQD Take 237 mLs by mouth 2 (two) times daily. Vanilla flavor  Yes Historical Provider, MD  isosorbide mononitrate (IMDUR) 30 MG 24 hr tablet Take 15 mg by mouth daily.   Yes Historical Provider, MD  levothyroxine (SYNTHROID, LEVOTHROID) 75 MCG tablet Take 75 mcg by mouth daily before breakfast.    Yes Historical Provider, MD  metoprolol succinate (TOPROL-XL) 100 MG 24 hr tablet Take 100 mg by mouth every morning. Take with or immediately following a meal.   Yes Historical Provider, MD   Physical Exam: Filed Vitals:   08/07/12 1645 08/07/12 1700 08/07/12 1730 08/07/12 1826  BP:  166/89 197/148 174/106  Pulse: 121 79 81   Resp: 19 23 21  20   SpO2: 87% 99% 96% 96%    Constitutional: Vital signs reviewed.  Patient is a well-developed and well-nourished in no acute distress and cooperative with exam. Alert, refused, she says a word or 2  intermittently but not in answer to questions asked  Head: Normocephalic and atraumatic Nose: No erythema or drainage noted.   Mouth: no erythema or exudates, dry MM Eyes: PERRL, EOMI, conjunctivae normal, No scleral icterus.  Neck: Supple, Trachea midline normal ROM, No JVD, mass, thyromegaly, or carotid bruit present.  Cardiovascular: RRR, S1 normal, S2 normal, no MRG, pulses symmetric and intact bilaterally Pulmonary/Chest: normal respiratory effort, CTAB, no wheezes, rales, or rhonchi Abdominal: Soft. Non-tender, non-distended, bowel sounds are normal, no masses, organomegaly, or guarding present.  GU: no CVA tenderness  extremities: No cyanosis and no edema  Neurological: Alert, disoriented, moves all extremities grossly Skin: Warm, dry and intact. No rash.    Labs on Admission:  Basic Metabolic Panel:  Recent Labs Lab 08/07/12 1622  NA 135  K 4.4  CL 103  CO2 18*  GLUCOSE 110*  BUN 21  CREATININE 1.41*  CALCIUM 9.3   Liver Function Tests:  Recent Labs Lab 08/07/12 1622  AST 20  ALT 13  ALKPHOS 74  BILITOT 0.7  PROT 7.0  ALBUMIN 3.7   No results found for this basename: LIPASE, AMYLASE,  in the last 168 hours No results found for this basename: AMMONIA,  in the last 168 hours CBC:  Recent Labs Lab 08/07/12 1622  WBC 12.1*  NEUTROABS 9.9*  HGB 12.5  HCT 35.4*  MCV 86.3  PLT 203   Cardiac Enzymes: No results found for this basename: CKTOTAL, CKMB, CKMBINDEX, TROPONINI,  in the last 168 hours  BNP (last 3 results) No results found for this basename: PROBNP,  in the last 8760 hours CBG: No results found for this basename: GLUCAP,  in the last 168 hours  Radiological Exams on Admission: Ct Head Wo Contrast  08/07/2012   *RADIOLOGY REPORT*  Clinical  Data: Severe headache.  Altered mental status  CT HEAD WITHOUT CONTRAST  Technique:  Contiguous axial images were obtained from the base of the skull through the vertex without contrast.  Comparison: CT 04/07/2012  Findings: Image quality degraded by significant motion.  Multiple images were repeated due to motion.  Moderate atrophy.  Chronic microvascular ischemic change in the white matter.  Chronic lacunar infarction in the left lateral basal ganglia is unchanged.  Allowing for motion, no acute infarct hemorrhage or mass lesion identified.  No focal skull lesion.  IMPRESSION: Image quality degraded by motion.  No acute abnormality is identified.   Original Report Authenticated By: Janeece Riggers, M.D.   Dg Chest Port 1 View  08/07/2012   *RADIOLOGY REPORT*  Clinical Data: 79 year old female shortness of breath weakness confusion.  PORTABLE CHEST - 1 VIEW  Comparison: 06/21/2012 and earlier.  Findings: AP portable semi upright view at 1355 hours.  Stable lung volumes.  Stable cardiac size and mediastinal contours. Breast attenuation artifact projecting over the central lower mediastinum. No pneumothorax or pulmonary edema.  No pleural effusion or consolidation.  IMPRESSION: Breast attenuation artifact felt responsible for the increased density over the lower mediastinum. No acute cardiopulmonary abnormality identified.   Original Report Authenticated By: Erskine Speed, M.D.    EKG: Independently reviewed. NSR at rate of 64  Assessment/Plan Active Problems: UTI (lower urinary tract infection) with sepsis syndrome -As discussed above patient presenting with a AMS, UA consistent with UTI, leukocytosis,  and initially hypotensive -Obtain urine cultures, and lactic acid, continue empiric antibiotics with Rocephin follow and further treat accordingly -Per daughter states she's had recurrent urinary tract infections and would like her to be referred to a urologist when she is discharged from the hospital.     Volume depletion -Hydrate follow and recheck AKI (acute kidney injury) -Her last creatinine in May 2014 was 0.74>>1.4 today -Likely prerenal, will hydrate follow ad trend   Toxic encephalopathy -Likely secondary to above in patient with baseline dementia -Treat as above and follow    HYPERTENSION -Continue outpatient medications with hold parameters, except for ACE inhibitor for now(due to AKI)   PAROXYSMAL ATRIAL FIBRILLATION Alzheimer's dementia      Code Status: DNR Family Communication: daughter [name] at ext [phone](ultrasound tech At [facility]) or cell [phone] Disposition Plan: admit to medical floor  Time spent: >23mins  Kela Millin [facility] Pager [phone]  If 7PM-7AM, please contact night-coverage [email or web address] Password [email or web address] 08/07/2012, 7:07 PM

## 2012-08-08 DIAGNOSIS — R5081 Fever presenting with conditions classified elsewhere: Secondary | ICD-10-CM

## 2012-08-08 LAB — URINE CULTURE: Colony Count: >=100000

## 2012-08-08 LAB — BASIC METABOLIC PANEL
Calcium: 8.5 mg/dL (ref 8.4–10.5)
Creatinine, Ser: 1.08 mg/dL (ref 0.50–1.10)
GFR calc Af Amer: 52 mL/min — ABNORMAL LOW (ref >90)
GFR calc non Af Amer: 44 mL/min — ABNORMAL LOW (ref >90)
Sodium: 138 mEq/L (ref 135–145)

## 2012-08-08 LAB — CBC
Platelets: 188 10*3/uL (ref 150–400)
RBC: 3.94 MIL/uL (ref 3.87–5.11)
RDW: 13.9 % (ref 11.5–15.5)
WBC: 8.3 10*3/uL (ref 4.0–10.5)

## 2012-08-08 LAB — MRSA PCR SCREENING: MRSA by PCR: POSITIVE — AB

## 2012-08-08 MED ORDER — CHLORHEXIDINE GLUCONATE CLOTH 2 % EX PADS
6.0000 | MEDICATED_PAD | Freq: Every day | CUTANEOUS | Status: DC
  Administered 2012-08-08 – 2012-08-09 (×2): 6 via TOPICAL

## 2012-08-08 MED ORDER — MUPIROCIN 2 % EX OINT
1.0000 "application " | TOPICAL_OINTMENT | Freq: Two times a day (BID) | CUTANEOUS | Status: DC
  Administered 2012-08-09: 1 via NASAL
  Filled 2012-08-08: qty 22

## 2012-08-08 MED ORDER — POTASSIUM CHLORIDE CRYS ER 20 MEQ PO TBCR
20.0000 meq | EXTENDED_RELEASE_TABLET | Freq: Two times a day (BID) | ORAL | Status: AC
  Administered 2012-08-08 (×2): 20 meq via ORAL
  Filled 2012-08-08 (×2): qty 1

## 2012-08-08 NOTE — Progress Notes (Signed)
Addendum  Patient seen and examined, chart and data base reviewed.  I agree with the above assessment and plan.  For full details please see Mrs. Algis Downs PA note.  UTI with sepsis.   Clint Lipps, MD [facility] Pager: [phone] 08/08/2012, 1:46 PM

## 2012-08-08 NOTE — Progress Notes (Signed)
[facility] PROGRESS NOTE  Kaitlyn Lucero AVW:[id number] DOB: 12-Aug-1921 DOA: 08/07/2012 PCP: [name], MD  Assessment/Plan:  UTI with Sepsis Sepsis now resolved Culture shows E-Coli On ceftriaxone Await sensitivities History of recurrent UTIs.  Family requests referral to Urologist at D/C  Acute renal failure Resolved with IVF and antibiotics  Dementia Per notes patient is normally quite talkative Today she gives short (inappropriate) responses to questions May need memory care at discharge.  Hypertension Stable on amlodipine, imdur, and metoprolol Benazepril will be resumed at discharge.  PAfib On low dose aspirin. Not on chronic anticoagulation.   DVT Prophylaxis:  lovenox  Code Status: DNR Family Communication:  Disposition Plan: Hopeful to d/c tomorrow to appropriate level of care pending PT/OT eval.   Consultants:    Procedures:  Antibiotics:  Rocephin  HPI/Subjective: Kaitlyn Lucero is a 79 y.o. female ALF/[facility] resident with past medical history as listed below including recurrent UTIs, Alzheimer's dementia paroxysmal Atrial fibrillation presents with above complaints. History is obtained from her daughter and chart reviewed. It is reported that patient had been sitting outside in the heat, and when staff checked on her and set her up she became very weak and stated that she felt short of breath. They report that she is usually talkative, but following that they state that she became less talkative which is different from her baseline of dementia/confusion. In the ED patient was initially hypotensive 96/46 with a pulse of 59 but on followup check she had a blood pressure of 174/106 with a pulse of 81. Urinalysis was consistent with a UTI, white cell count of 12.1. She was started on empiric antibiotics and is admitted for further evaluation and management .  As documented per RN note on Epic daughter who is POA states the  patient is a DO NOT RESUSCITATE and she does not want to the patient going to a telemetry bed.   Objective: Filed Vitals:   08/07/12 1826 08/07/12 2011 08/07/12 2048 08/08/12 0504  BP: 174/106 172/86 157/75 131/64  Pulse:  87 76 94  Temp:  98.2 F (36.8 C) 99.2 F (37.3 C) 98.4 F (36.9 C)  TempSrc:  Rectal Axillary Axillary  Resp: 20 18 20 18   Height:   5\' 1"  (1.549 m)   Weight:   66.9 kg (147 lb 7.8 oz)   SpO2: 96% 100% 97% 95%    Intake/Output Summary (Last 24 hours) at 08/08/12 1150 Last data filed at 08/08/12 0842  Gross per 24 hour  Intake   2450 ml  Output      0 ml  Net   2450 ml   Filed Weights   08/07/12 2048  Weight: 66.9 kg (147 lb 7.8 oz)    Exam:   General:  A&O, NAD, Lying comfortably in bed, pleasantly demented.  Cardiovascular: RRR, no murmurs, rubs or gallops, no lower extremity edema  Respiratory: CTA, no wheeze, crackles, or rales.  No increased work of breathing.  Abdomen: Soft, thin, non-tender, non-distended, + bowel sounds, no masses  Musculoskeletal: Able to move all 4 extremities, 5/5 strength in each  Neuro:  Pleasant, but does not follow commands or answer questions appropriately.  Data Reviewed: Basic Metabolic Panel:  Recent Labs Lab 08/07/12 1622 08/08/12 0540  NA 135 138  K 4.4 3.4*  CL 103 108  CO2 18* 18*  GLUCOSE 110* 102*  BUN 21 13  CREATININE 1.41* 1.08  CALCIUM 9.3 8.5   Liver Function Tests:  Recent Labs Lab 08/07/12 1622  AST 20  ALT 13  ALKPHOS 74  BILITOT 0.7  PROT 7.0  ALBUMIN 3.7   CBC:  Recent Labs Lab 08/07/12 1622 08/08/12 0540  WBC 12.1* 8.3  NEUTROABS 9.9*  --   HGB 12.5 11.9*  HCT 35.4* 34.0*  MCV 86.3 86.3  PLT 203 188     Recent Results (from the past 240 hour(s))  URINE CULTURE     Status: None   Collection Time    08/07/12  4:21 PM      Result Value Range Status   Specimen Description URINE, CLEAN CATCH   Final   Special Requests NONE   Final   Culture  Setup Time  08/07/2012 17:50   Final   Colony Count >=100,000 COLONIES/ML   Final   Culture ESCHERICHIA COLI   Final   Report Status PENDING   Incomplete  MRSA PCR SCREENING     Status: Abnormal   Collection Time    08/07/12 11:38 PM      Result Value Range Status   MRSA by PCR POSITIVE (*) NEGATIVE Final   Comment:            The GeneXpert MRSA Assay (FDA     approved for NASAL specimens     only), is one component of a     comprehensive MRSA colonization     surveillance program. It is not     intended to diagnose MRSA     infection nor to guide or     monitor treatment for     MRSA infections.     RESULT CALLED TO, READ BACK BY AND VERIFIED WITH:     [facility] RN 1610 08/08/12 MITCHELL,L     Studies: Ct Head Wo Contrast  08/07/2012   *RADIOLOGY REPORT*  Clinical Data: Severe headache.  Altered mental status  CT HEAD WITHOUT CONTRAST  Technique:  Contiguous axial images were obtained from the base of the skull through the vertex without contrast.  Comparison: CT 04/07/2012  Findings: Image quality degraded by significant motion.  Multiple images were repeated due to motion.  Moderate atrophy.  Chronic microvascular ischemic change in the white matter.  Chronic lacunar infarction in the left lateral basal ganglia is unchanged.  Allowing for motion, no acute infarct hemorrhage or mass lesion identified.  No focal skull lesion.  IMPRESSION: Image quality degraded by motion.  No acute abnormality is identified.   Original Report Authenticated By: Janeece Riggers, M.D.   Dg Chest Port 1 View  08/07/2012   *RADIOLOGY REPORT*  Clinical Data: 79 year old female shortness of breath weakness confusion.  PORTABLE CHEST - 1 VIEW  Comparison: 06/21/2012 and earlier.  Findings: AP portable semi upright view at 1355 hours.  Stable lung volumes.  Stable cardiac size and mediastinal contours. Breast attenuation artifact projecting over the central lower mediastinum. No pneumothorax or pulmonary edema.  No pleural effusion  or consolidation.  IMPRESSION: Breast attenuation artifact felt responsible for the increased density over the lower mediastinum. No acute cardiopulmonary abnormality identified.   Original Report Authenticated By: Erskine Speed, M.D.    Scheduled Meds: . amLODipine  10 mg Oral Daily  . aspirin  81 mg Oral Daily  . cefTRIAXone (ROCEPHIN)  IV  1 g Intravenous Q24H  . Chlorhexidine Gluconate Cloth  6 each Topical Q0600  . enoxaparin (LOVENOX) injection  30 mg Subcutaneous Q24H  . feeding supplement  237 mL Oral BID  . isosorbide mononitrate  15 mg Oral Daily  .  levothyroxine  75 mcg Oral QAC breakfast  . metoprolol succinate  100 mg Oral QPC breakfast  . mupirocin ointment  1 application Nasal BID  . potassium chloride  20 mEq Oral BID   Continuous Infusions: . sodium chloride 1,000 mL (08/08/12 0840)    Active Problems:   HYPERTENSION   PAROXYSMAL ATRIAL FIBRILLATION   Acute encephalopathy   UTI (lower urinary tract infection)   Alzheimer's dementia   AKI (acute kidney injury)   Volume depletion    Conley Canal  [facility] Pager [phone]. If 7PM-7AM, please contact night-coverage at [email or web address], password [email or web address] 08/08/2012, 11:50 AM  LOS: 1 day

## 2012-08-08 NOTE — Care Management Note (Signed)
    Page 1 of 1   08/09/2012     11:23:39 AM   CARE MANAGEMENT NOTE 08/09/2012  Patient:  BLAKELEIGH, DOMEK   Account Number:  [id number]  Date Initiated:  08/08/2012  Documentation initiated by:  Letha Cape  Subjective/Objective Assessment:   dx hypertension  admit- from [facility]- [facility]     Action/Plan:   pt eval- rec snf   Anticipated DC Date:  08/09/2012   Anticipated DC Plan:  ASSISTED LIVING / REST HOME  In-house referral  Clinical Social Worker      DC Associate Professor  CM consult      Choice offered to / List presented to:             Status of service:  Completed, signed off Medicare Important Message given?   (If response is "NO", the following Medicare IM given date fields will be blank) Date Medicare IM given:   Date Additional Medicare IM given:    Discharge Disposition:  ASSISTED LIVING  Per UR Regulation:  Reviewed for med. necessity/level of care/duration of stay  If discussed at Long Length of Stay Meetings, dates discussed:    Comments:  08/09/12 11:22 Letha Cape RN, BSN [phone] patient is for dc back to [facility]F today, urology appt scheduled for 7/28 at 1 pm.  Physical therapy rec snf but patient has decided to go back to ALF.  08/08/12 16:52 Letha Cape RN, BSN [phone] patient is from [facility], per physical therapy recs snf, CSW referral.

## 2012-08-08 NOTE — Discharge Summary (Addendum)
Physician Discharge Summary  Kaitlyn Lucero YNW:[id number] DOB: [birth year] DOA: 08/07/2012  PCP: [name], MD  Admit date: 08/07/2012 Discharge date: 08/09/2012  Time spent: 50 minutes.  Recommendations for Outpatient Follow-up:  -Take antibiotics as prescribed until the entire course is completed.   -Follow-up with your primary care provider in 1 week.  Check cbc, bmet -Urology follow-up has been scheduled with Dr. Brunilda Payor on 7/27 -Recommend memory care with her history of Alzheimer's disease  Discharge Diagnoses:  Active Problems:   HYPERTENSION   PAROXYSMAL ATRIAL FIBRILLATION   Acute encephalopathy   UTI (lower urinary tract infection)   Alzheimer's dementia   AKI (acute kidney injury)   Volume depletion   Discharge Condition: Stable.  Diet recommendation:  -Regular heart-healthy diet -Adequate fluid intake, especially water to help with the urinary tract infection.   Filed Weights   08/07/12 2048  Weight: 66.9 kg (147 lb 7.8 oz)    History of present illness at the time of admission:   Kaitlyn Lucero is a 79 y.o. female ALF/[facility] resident with past medical history as listed below including recurrent UTIs, Alzheimer's dementia paroxysmal Atrial fibrillation presents with above complaints. History is obtained from her daughter and chart reviewed. It is reported that patient had been sitting outside in the heat, and when staff checked on her and set her up she became very weak and stated that she felt short of breath. They report that she is usually talkative, but following that they state that she became less talkative which is different from her baseline of dementia/confusion. In the ED patient was initially hypotensive 96/46 with a pulse of 59 but on followup check she had a blood pressure of 174/106 with a pulse of 81. Urinalysis was consistent with a UTI, white cell count of 12.1. She was started on empiric antibiotics and is admitted for further  evaluation and management . As documented per RN note on Epic daughter who is POA states the patient is a DO NOT RESUSCITATE and she does not want to the patient going to a telemetry bed.  Today, patient is not oriented to herself, time or place.  She remained lying on her side in bed with hand mitts to prevent her pulling out her IV.  She seemed pleasant, however not following simple commands, such as moving her legs or turning over to her back.  She will be discharged today to SNF with recommendations on PCP and urology follow-up, as well as, memory care.   Hospital Course:   Principle Problem: UTI with Sepsis  Sepsis now resolved  Culture shows E-Coli resistant to several antibiotics. Switched from ceftriaxone to oral Ceftin upon discharge History of recurrent UTIs. Family requests referral to Urologist at D/C .  This has been arranged with Dr. Brunilda Payor on 7/27  Active Problems: Acute renal failure  Resolved with IVF and antibiotics   Dementia  Per notes patient is normally quite talkative  On 08/08/12 she gives short (inappropriate) responses to questions  On 08/09/12 she is not oriented to herself, time, or place, but much more talkative again. Needs memory care at discharge.   Hypertension  Stable - on amlodipine, imdur, and metoprolol  Benazepril will be resumed at discharge.   PA-fib  On low dose aspirin.  Not on chronic anticoagulation.    Discharge Exam: Filed Vitals:   08/08/12 0504 08/08/12 1315 08/08/12 2133 08/09/12 0517  BP: 131/64 130/71 158/83 169/92  Pulse: 94 88 94 87  Temp: 98.4 F (36.9 C)  97.9 F (36.6 C) 98.6 F (37 C) 98.5 F (36.9 C)  TempSrc: Axillary Axillary Oral Oral  Resp: 18 20 18 20   Height:      Weight:      SpO2: 95% 96% 99% 100%    General: Patient asleep and lying in bed; some difficulty with arousal; not oriented to self, time, or place. Cardiovascular: RRR, without mgr. Respiratory: lungs clear to auscultation, without wheezes,  rhonchi, or rales. Abdomen: soft, nontender, nondistended, BS+. Musculoskeletal: passive ROM of all 4 extremities; not following commands for AROM; no edema noted. Skin: no rash, bruising, or ulceration.  Discharge Instructions      Discharge Orders   Future Orders Complete By Expires     Diet general  As directed     Increase activity slowly  As directed         Medication List    STOP taking these medications       metoprolol succinate 100 MG 24 hr tablet  Commonly known as:  TOPROL-XL      TAKE these medications       acetaminophen 325 MG tablet  Commonly known as:  TYLENOL  Take 2 tablets (650 mg total) by mouth every 6 (six) hours as needed.     amLODipine 10 MG tablet  Commonly known as:  NORVASC  Take 1 tablet (10 mg total) by mouth daily.     aspirin 81 MG chewable tablet  Chew 81 mg by mouth daily.     benazepril 40 MG tablet  Commonly known as:  LOTENSIN  Take 40 mg by mouth daily.     bisacodyl 10 MG suppository  Commonly known as:  DULCOLAX  Place 1 suppository (10 mg total) rectally as needed for constipation.     cefUROXime 500 MG tablet  Commonly known as:  CEFTIN  Take 1 tablet (500 mg total) by mouth 2 (two) times daily.     feeding supplement Liqd  Take 237 mLs by mouth 2 (two) times daily. Vanilla flavor     isosorbide mononitrate 30 MG 24 hr tablet  Commonly known as:  IMDUR  Take 15 mg by mouth daily.     levothyroxine 75 MCG tablet  Commonly known as:  SYNTHROID, LEVOTHROID  Take 75 mcg by mouth daily before breakfast.     metoprolol 50 MG tablet  Commonly known as:  LOPRESSOR  Take 1 tablet (50 mg total) by mouth 2 (two) times daily.     ondansetron 4 MG disintegrating tablet  Commonly known as:  ZOFRAN ODT  Take 1 tablet (4 mg total) by mouth every 8 (eight) hours as needed for nausea.     traMADol 50 MG tablet  Commonly known as:  ULTRAM  Take 1 tablet (50 mg total) by mouth every 8 (eight) hours as needed for pain.        Allergies  Allergen Reactions  . Amoxicillin-Pot Clavulanate Diarrhea  . Nitrofurantoin Diarrhea    Family and Per nursing home mar  . Banana Flavor Rash  . Codeine Palpitations    Family states not a true allergy, Per nursing home mar  . Lidocaine Palpitations    Family states not a true allergy, Per nursing home mar   Follow-up Information   Follow up with NESI,MARC-HENRY, MD On 09/04/2012. (1:00- Urology)    Contact information:   [address]LO[address]  [place] [address] [phone]       Follow up with [name], MD. Schedule an appointment as soon as possible for a visit in 1 week.   Contact information:   [address] [place] [address] [phone]        The results of significant diagnostics from this hospitalization (including imaging, microbiology, ancillary and laboratory) are listed below for reference.    Significant Diagnostic Studies: Ct Head Wo Contrast  08/07/2012   *RADIOLOGY REPORT*  Clinical Data: Severe headache.  Altered mental status  CT HEAD WITHOUT CONTRAST  Technique:  Contiguous axial images were obtained from the base of the skull through the vertex without contrast.  Comparison: CT 04/07/2012  Findings: Image quality degraded by significant motion.  Multiple images were repeated due to motion.  Moderate atrophy.  Chronic microvascular ischemic change in the white matter.  Chronic lacunar infarction in the left lateral basal ganglia is unchanged.  Allowing for motion, no acute infarct hemorrhage or mass lesion identified.  No focal skull lesion.  IMPRESSION: Image quality degraded by motion.  No acute abnormality is identified.   Original Report Authenticated By: Janeece Riggers, M.D.   Dg Chest Port 1 View  08/07/2012   *RADIOLOGY REPORT*  Clinical Data: 79 year old female shortness of breath weakness confusion.  PORTABLE CHEST - 1 VIEW  Comparison: 06/21/2012 and earlier.  Findings: AP  portable semi upright view at 1355 hours.  Stable lung volumes.  Stable cardiac size and mediastinal contours. Breast attenuation artifact projecting over the central lower mediastinum. No pneumothorax or pulmonary edema.  No pleural effusion or consolidation.  IMPRESSION: Breast attenuation artifact felt responsible for the increased density over the lower mediastinum. No acute cardiopulmonary abnormality identified.   Original Report Authenticated By: Erskine Speed, M.D.    Microbiology: Recent Results (from the past 240 hour(s))  URINE CULTURE     Status: None   Collection Time    08/07/12  4:21 PM      Result Value Range Status   Specimen Description URINE, CLEAN CATCH   Final   Special Requests NONE   Final   Culture  Setup Time 08/07/2012 17:50   Final   Colony Count >=100,000 COLONIES/ML   Final   Culture ESCHERICHIA COLI   Final   Report Status 08/08/2012 FINAL   Final   Organism ID, Bacteria ESCHERICHIA COLI   Final  MRSA PCR SCREENING     Status: Abnormal   Collection Time    08/07/12 11:38 PM      Result Value Range Status   MRSA by PCR POSITIVE (*) NEGATIVE Final   Comment:            The GeneXpert MRSA Assay (FDA     approved for NASAL specimens     only), is one component of a     comprehensive MRSA colonization     surveillance program. It is not     intended to diagnose MRSA     infection nor to guide or     monitor treatment for     MRSA infections.     RESULT CALLED TO, READ BACK BY AND VERIFIED WITH:     [facility] RN [id number] 08/08/12 MITCHELL,L     Labs: Basic Metabolic Panel:  Recent Labs Lab 08/07/12 1622 08/08/12 0540 08/09/12 0625  NA 135 138 139  K 4.4 3.4* 3.1*  CL 103 108 110  CO2 18* 18* 17*  GLUCOSE 110* 102* 100*  BUN 21 13 7   CREATININE 1.41* 1.08 0.81  CALCIUM 9.3 8.5 8.4   Liver Function Tests:  Recent Labs Lab 08/07/12 1622  AST 20  ALT 13  ALKPHOS 74  BILITOT 0.7  PROT 7.0  ALBUMIN 3.7  CBC:  Recent Labs Lab 08/07/12 1622  08/08/12 0540 08/09/12 0625  WBC 12.1* 8.3 6.8  NEUTROABS 9.9*  --   --   HGB 12.5 11.9* 10.7*  HCT 35.4* 34.0* 31.2*  MCV 86.3 86.3 87.2  PLT 203 188 174    Signed:  MACHAJ, VERONICA PA-S Algis Downs, PA-C [facility] 08/09/2012, 2:20 PM

## 2012-08-08 NOTE — Evaluation (Signed)
Physical Therapy Evaluation Patient Details Name: Kaitlyn Lucero MRN: [id number] DOB: 01-Mar-1921 Today's Date: 08/08/2012 Time: 4098-1191 PT Time Calculation (min): 11 min  PT Assessment / Plan / Recommendation History of Present Illness     Clinical Impression  Pt is an 79 year old female admitted from SNF for UTI and sepsis with PMHx of L septic wrist arthritis, UTIs, Alzheimer's disease, dementia.  Pt disorientated and at times nonsensical however able to participate with multimodal cues.  Pt would benefit from acute PT services in order to improve safety and independence with mobility in preparation for return to SNF.  Pt currently at least mod assist to stand today.    PT Assessment  Patient needs continued PT services    Follow Up Recommendations  SNF;Supervision/Assistance - 24 hour    Does the patient have the potential to tolerate intense rehabilitation      Barriers to Discharge        Equipment Recommendations  None recommended by PT    Recommendations for Other Services     Frequency Min 2X/week    Precautions / Restrictions Precautions Precautions: Fall   Pertinent Vitals/Pain Reports pain in her hands, RN into room upon departure      Mobility  Bed Mobility Bed Mobility: Supine to Sit;Sit to Supine;Scooting to HOB Supine to Sit: 3: Mod assist Sit to Supine: 4: Min assist Scooting to HOB: 1: +2 Total assist Scooting to [facility]: Patient Percentage: 0% Details for Bed Mobility Assistance: multimodal cues for technique, pt required assist to initiate then able to assist somewhat with transfers, unable to scoot up in bed with cues (nsg tech in to take vitals upon return to supine) Transfers Transfers: Sit to Stand;Stand to Sit Sit to Stand: 3: Mod assist;With upper extremity assist;From bed;From elevated surface Stand to Sit: 3: Mod assist;With upper extremity assist;To bed;To elevated surface Details for Transfer Assistance: assist to rise and control descent,  provided HHA for R side (recent hx of L wrist infection), pt with buckling LEs and not deemed safe to transfer or ambulate without +2 so pt assisted back to bed Ambulation/Gait Ambulation/Gait Assistance: Not tested (comment)    Exercises     PT Diagnosis: Difficulty walking;Generalized weakness  PT Problem List: Decreased strength;Decreased activity tolerance;Decreased mobility;Decreased safety awareness;Decreased knowledge of use of DME;Decreased cognition PT Treatment Interventions: DME instruction;Gait training;Functional mobility training;Therapeutic activities;Therapeutic exercise;Patient/family education     PT Goals(Current goals can be found in the care plan section) Acute Rehab PT Goals PT Goal Formulation: Patient unable to participate in goal setting Time For Goal Achievement: 08/22/12 Potential to Achieve Goals: Fair  Visit Information  Last PT Received On: 08/22/12 Assistance Needed: +2       Prior Functioning  Home Living Family/patient expects to be discharged to:: Skilled nursing facility Prior Function Level of Independence: Needs assistance Comments: walked independently without assistive device PTA (per note from previous admission) Communication Communication: No difficulties    Cognition  Cognition Arousal/Alertness: Awake/alert Behavior During Therapy: WFL for tasks assessed/performed Overall Cognitive Status: No family/caregiver present to determine baseline cognitive functioning    Extremity/Trunk Assessment Lower Extremity Assessment Lower Extremity Assessment: Generalized weakness   Balance Balance Balance Assessed: Yes Static Sitting Balance Static Sitting - Balance Support: No upper extremity supported;Feet supported Static Sitting - Level of Assistance: 5: Stand by assistance Static Sitting - Comment/# of Minutes: able to sit EOB for 2 min  End of Session PT - End of Session Activity Tolerance: Other (comment) (limited by  cognition) Patient left: in bed;with call bell/phone within reach;with nursing/sitter in room Nurse Communication: Mobility status  GP     Ayrton Mcvay,KATHrine E 08/08/2012, 1:17 PM Zenovia Jarred, PT, DPT 08/08/2012 Pager: [phone]

## 2012-08-08 NOTE — Progress Notes (Signed)
UR Chart Review Completed  

## 2012-08-09 DIAGNOSIS — R4182 Altered mental status, unspecified: Secondary | ICD-10-CM

## 2012-08-09 LAB — CBC
Hemoglobin: 10.7 g/dL — ABNORMAL LOW (ref 12.0–15.0)
MCHC: 34.3 g/dL (ref 30.0–36.0)
RBC: 3.58 MIL/uL — ABNORMAL LOW (ref 3.87–5.11)
WBC: 6.8 10*3/uL (ref 4.0–10.5)

## 2012-08-09 LAB — BASIC METABOLIC PANEL
BUN: 7 mg/dL (ref 6–23)
GFR calc Af Amer: 73 mL/min — ABNORMAL LOW (ref >90)
GFR calc non Af Amer: 63 mL/min — ABNORMAL LOW (ref >90)
Potassium: 3.1 mEq/L — ABNORMAL LOW (ref 3.5–5.1)
Sodium: 139 mEq/L (ref 135–145)

## 2012-08-09 MED ORDER — TRAMADOL HCL 50 MG PO TABS: 50.0000 mg | ORAL_TABLET | Freq: Three times a day (TID) | ORAL | Status: AC | PRN

## 2012-08-09 MED ORDER — POTASSIUM CHLORIDE CRYS ER 20 MEQ PO TBCR
40.0000 meq | EXTENDED_RELEASE_TABLET | Freq: Once | ORAL | Status: AC
  Administered 2012-08-09: 40 meq via ORAL
  Filled 2012-08-09: qty 2

## 2012-08-09 MED ORDER — BISACODYL 10 MG RE SUPP: 10.0000 mg | RECTAL | Status: AC | PRN

## 2012-08-09 MED ORDER — CEFUROXIME AXETIL 500 MG PO TABS: 500.0000 mg | ORAL_TABLET | Freq: Two times a day (BID) | ORAL | Status: DC

## 2012-08-09 MED ORDER — ACETAMINOPHEN 325 MG PO TABS: 650.0000 mg | ORAL_TABLET | Freq: Four times a day (QID) | ORAL | Status: DC | PRN

## 2012-08-09 MED ORDER — METOPROLOL TARTRATE 50 MG PO TABS: 50.0000 mg | ORAL_TABLET | Freq: Two times a day (BID) | ORAL | Status: DC

## 2012-08-09 MED ORDER — METOPROLOL TARTRATE 50 MG PO TABS
50.0000 mg | ORAL_TABLET | Freq: Two times a day (BID) | ORAL | Status: DC
  Administered 2012-08-09: 50 mg via ORAL
  Filled 2012-08-09 (×2): qty 1

## 2012-08-09 MED ORDER — ONDANSETRON 4 MG PO TBDP: 4.0000 mg | ORAL_TABLET | Freq: Three times a day (TID) | ORAL | Status: AC | PRN

## 2012-08-09 NOTE — Progress Notes (Signed)
Physical Therapy Treatment Patient Details Name: Kaitlyn Lucero MRN: [id number] DOB: [birth year] Today's Date: 08/09/2012 Time: 4098-1191 PT Time Calculation (min): 23 min  PT Assessment / Plan / Recommendation  PT Comments   Pt assisted OOB with assist for hygiene due to soaked bed linen and ambulated around room to recliner with 2 HHA.  Continue to recommend SNF unless ALF can provide current level of care.  Follow Up Recommendations  SNF;Supervision/Assistance - 24 hour     Does the patient have the potential to tolerate intense rehabilitation     Barriers to Discharge        Equipment Recommendations  None recommended by PT    Recommendations for Other Services    Frequency     Progress towards PT Goals Progress towards PT goals: Progressing toward goals  Plan Current plan remains appropriate    Precautions / Restrictions Precautions Precautions: Fall Precaution Comments: baseline dementia Restrictions Weight Bearing Restrictions: No   Pertinent Vitals/Pain No signs of distress    Mobility  Bed Mobility Bed Mobility: Rolling Right;Right Sidelying to Sit Rolling Right: 2: Max assist Right Sidelying to Sit: 2: Max assist Details for Bed Mobility Assistance: max demonstrational cueing for transfer supine to sit EOB. increased assist likely due to cognition Transfers Transfers: Sit to Stand;Stand to Sit Sit to Stand: 4: Min assist;From elevated surface;From bed;With upper extremity assist Stand to Sit: 4: Min assist;With upper extremity assist;To chair/3-in-1 Details for Transfer Assistance: Pt needs max demonstrational cueing and min assist to initiate sit to stand. increased time for transfers to allow pt time to process and/or assist with initiating movement, hygiene performed by OT upon standing due to soaked bed linen Ambulation/Gait Ambulation/Gait Assistance: 4: Min assist Ambulation Distance (Feet): 10 Feet Assistive device: 2 person hand held assist;1 person  hand held assist Ambulation/Gait Assistance Details: pt ambulated around bed to recliner with variable 1-2 HHA to steady Gait Pattern: Step-through pattern;Decreased stride length    Exercises     PT Diagnosis:    PT Problem List:   PT Treatment Interventions:     PT Goals (current goals can now be found in the care plan section) Acute Rehab PT Goals Patient Stated Goal: Pt did not state during session.  Visit Information  Last PT Received On: 08/09/12 Assistance Needed: +2 (safety) History of Present Illness: Pt is an 79 year old female admitted from SNF for UTI and sepsis with PMHx of L septic wrist arthritis, UTIs, Alzheimer's disease, dementia.    Subjective Data  Patient Stated Goal: Pt did not state during session.   Cognition  Cognition Arousal/Alertness: Awake/alert Behavior During Therapy: WFL for tasks assessed/performed Overall Cognitive Status: History of cognitive impairments - at baseline    Balance  Balance Balance Assessed: Yes Static Standing Balance Static Standing - Balance Support: Bilateral upper extremity supported Static Standing - Level of Assistance: 4: Min assist  End of Session PT - End of Session Equipment Utilized During Treatment: Gait belt Activity Tolerance: Other (comment) Patient left: in chair;with chair alarm set   GP     Kaitlyn Lucero,KATHrine E 08/09/2012, 11:03 AM Zenovia Jarred, PT, DPT 08/09/2012 Pager: [phone]

## 2012-08-09 NOTE — Discharge Summary (Signed)
Addendum  Patient seen and examined, chart and data base reviewed.  I agree with the above assessment and plan.  For full details please see Mrs. Algis Downs PA note.   Clint Lipps, MD [facility] Pager: [phone] 08/09/2012, 4:19 PM

## 2012-08-09 NOTE — Evaluation (Signed)
Occupational Therapy Evaluation Patient Details Name: Kaitlyn Lucero MRN: [id number] DOB: [birth year] Today's Date: 08/09/2012 Time: 8469-6295 OT Time Calculation (min): 31 min  OT Assessment / Plan / Recommendation History of present illness Pt is an 79 year old female admitted from SNF for UTI and sepsis with PMHx of L septic wrist arthritis, UTIs, Alzheimer's disease, dementia.   Clinical Impression   Pt currently needing mod to total assist for selfcare tasks such as feeding, bathing, and grooming secondary to cognition.  She is able to perform functional transfers with min assist.  Overall will need 24 hour assist post discharge but will benefit from acute care OT and SNF for follow-up    OT Assessment  Patient needs continued OT Services    Follow Up Recommendations  SNF       Equipment Recommendations  None recommended by OT       Frequency  Min 2X/week    Precautions / Restrictions Precautions Precautions: Fall Precaution Comments: baseline dementia Restrictions Weight Bearing Restrictions: No   Pertinent Vitals/Pain Pt with no sign of pain during session    ADL  Eating/Feeding: Performed;Moderate assistance Where Assessed - Eating/Feeding: Chair Grooming: Performed;Moderate assistance;Wash/dry face;Wash/dry hands Where Assessed - Grooming: Supported sitting Upper Body Bathing: Simulated;Moderate assistance Where Assessed - Upper Body Bathing: Supported sitting Lower Body Bathing: Simulated;Maximal assistance Where Assessed - Lower Body Bathing: Supported sit to stand Lower Body Dressing: Performed;+1 Total assistance (To donn gripper socks) Toilet Transfer: Simulated;Minimal assistance Toilet Transfer Method: Other (comment) (ambulate around the bed to the bedside chair) Toilet Transfer Equipment: Bedside commode Toileting - Clothing Manipulation and Hygiene: Simulated;Moderate assistance Where Assessed - Toileting Clothing Manipulation and Hygiene: Sit to stand  from 3-in-1 or toilet Tub/Shower Transfer Method: Not assessed Equipment Used: Gait belt Transfers/Ambulation Related to ADLs: Pt is currently min assist level for functional transfers.  Decreased ability to follow and understand one step commands which was likely her baseline.  Pt would respond to questions with sentences and statements not related to the question therapist asked.   ADL Comments: Pt is currently mod to total assist for selfcare tasks secondary to confusion and likely baseline dementia.  Needs hand over hand assistance to initiate and perform self feeding and grooming tasks.  Will need 24 hour supervision/assistance at discharge.    OT Diagnosis: Generalized weakness;Cognitive deficits  OT Problem List: Decreased strength;Impaired balance (sitting and/or standing);Decreased safety awareness;Decreased cognition OT Treatment Interventions: Self-care/ADL training;Therapeutic activities;DME and/or AE instruction;Balance training;Patient/family education   OT Goals(Current goals can be found in the care plan section) Acute Rehab OT Goals Patient Stated Goal: Pt did not state during session. OT Goal Formulation: Patient unable to participate in goal setting Time For Goal Achievement: 08/23/12 Potential to Achieve Goals: Good  Visit Information  History of Present Illness: Pt is an 79 year old female admitted from SNF for UTI and sepsis with PMHx of L septic wrist arthritis, UTIs, Alzheimer's disease, dementia.       Prior Functioning     Home Living Family/patient expects to be discharged to:: Skilled nursing facility Additional Comments: Pt unable to state PLOF but was residing at [facility] Prior Function Level of Independence: Needs assistance Comments: walked independently without assistive device PTA (per note from previous admission) Communication Communication: No difficulties Dominant Hand:  (Did not determine)         Vision/Perception Vision -  History Baseline Vision: No visual deficits Patient Visual Report: No change from baseline Vision - Assessment Eye Alignment: Within  Functional Limits Vision Assessment: Vision not tested Perception Perception: Not tested Praxis Praxis: Not tested   Cognition  Cognition Arousal/Alertness: Awake/alert Behavior During Therapy: WFL for tasks assessed/performed Overall Cognitive Status: History of cognitive impairments - at baseline    Extremity/Trunk Assessment Upper Extremity Assessment Upper Extremity Assessment: Generalized weakness;RUE deficits/detail;LUE deficits/detail RUE Deficits / Details: Pt able to spontaneously move both UEs but did not formally test strength. Pt able to hold washcloth and drink from glass. LUE Deficits / Details: Pt able to spontaneously move both UEs but did not formally test strength.  Pt does have history of septic arthritis in her wrist. Cervical / Trunk Assessment Cervical / Trunk Assessment: Normal     Mobility Bed Mobility Bed Mobility: Rolling Right;Right Sidelying to Sit Rolling Right: 2: Max assist Right Sidelying to Sit: 2: Max assist Details for Bed Mobility Assistance: max demonstrational cueing for transfer supine to sit EOB. Transfers Transfers: Sit to Stand Sit to Stand: 4: Min assist;From elevated surface;From bed;With upper extremity assist Stand to Sit: 4: Min assist;With upper extremity assist;To chair/3-in-1 Details for Transfer Assistance: Pt needs max demonstrational cueing and min assist to initiate sit to stand.        Balance Balance Balance Assessed: Yes Static Standing Balance Static Standing - Balance Support: Bilateral upper extremity supported Static Standing - Level of Assistance: 4: Min assist   End of Session OT - End of Session Equipment Utilized During Treatment: Gait belt Activity Tolerance: Patient tolerated treatment well Patient left: in chair;with call bell/phone within reach;with chair alarm set Nurse  Communication: Mobility status     Iline Buchinger OTR/L Pager number [phone] 08/09/2012, 10:12 AM

## 2012-08-09 NOTE — Progress Notes (Signed)
NURSING PROGRESS NOTE  Kaitlyn Lucero [id number] Discharge Data: 08/09/2012 3:19 PM Attending Provider: Clydia Llano, MD WUJ:[id number] Roseanne Reno, MD     Larena Glassman to be D/C'd Skilled nursing facility per MD order. All IV's discontinued with no bleeding noted. All belongings returned to patient for patient to take home.   Last Vital Signs:  Blood pressure 138/67, pulse 74, temperature 98.6 F (37 C), temperature source Oral, resp. rate 18, height 5\' 1"  (1.549 m), weight 66.9 kg (147 lb 7.8 oz), SpO2 99.00%.  Discharge Medication List   Medication List    STOP taking these medications       metoprolol succinate 100 MG 24 hr tablet  Commonly known as:  TOPROL-XL      TAKE these medications       acetaminophen 325 MG tablet  Commonly known as:  TYLENOL  Take 2 tablets (650 mg total) by mouth every 6 (six) hours as needed.     amLODipine 10 MG tablet  Commonly known as:  NORVASC  Take 1 tablet (10 mg total) by mouth daily.     aspirin 81 MG chewable tablet  Chew 81 mg by mouth daily.     benazepril 40 MG tablet  Commonly known as:  LOTENSIN  Take 40 mg by mouth daily.     bisacodyl 10 MG suppository  Commonly known as:  DULCOLAX  Place 1 suppository (10 mg total) rectally as needed for constipation.     cefUROXime 500 MG tablet  Commonly known as:  CEFTIN  Take 1 tablet (500 mg total) by mouth 2 (two) times daily.     feeding supplement Liqd  Take 237 mLs by mouth 2 (two) times daily. Vanilla flavor     isosorbide mononitrate 30 MG 24 hr tablet  Commonly known as:  IMDUR  Take 15 mg by mouth daily.     levothyroxine 75 MCG tablet  Commonly known as:  SYNTHROID, LEVOTHROID  Take 75 mcg by mouth daily before breakfast.     metoprolol 50 MG tablet  Commonly known as:  LOPRESSOR  Take 1 tablet (50 mg total) by mouth 2 (two) times daily.     ondansetron 4 MG disintegrating tablet  Commonly known as:  ZOFRAN ODT  Take 1 tablet (4 mg total) by mouth every  8 (eight) hours as needed for nausea.     traMADol 50 MG tablet  Commonly known as:  ULTRAM  Take 1 tablet (50 mg total) by mouth every 8 (eight) hours as needed for pain.

## 2012-08-09 NOTE — Progress Notes (Signed)
Clinical Social Work Department BRIEF PSYCHOSOCIAL ASSESSMENT 08/09/2012  Patient:  AWILDA, COVIN     Account Number:  [id number]     Admit date:  08/07/2012  Clinical Social Worker:  Dennison Bulla  Date/Time:  08/09/2012 10:45 AM  Referred by:  Physician  Date Referred:  08/09/2012 Referred for  ALF Placement   Other Referral:   Interview type:  Family Other interview type:    PSYCHOSOCIAL DATA Living Status:  FACILITY Admitted from facility:  [facility] Level of care:  Assisted Living Primary support name:  [name] Primary support relationship to patient:  CHILD, ADULT Degree of support available:   Strong    CURRENT CONCERNS Current Concerns  Post-Acute Placement   Other Concerns:    SOCIAL WORK ASSESSMENT / PLAN CSW received referral to assist with DC planning. CSW reviewed chart and observed that patient is too confused to complete assessment. CSW called and spoke with dtr via phone.    CSW introduced myself and explained role. Dtr confirms that patient lives at [facility] and desires patient to return at DC. CSW explained that PT/OT recommends SNF placement at DC. Dtr reports that patient has been at ALF for about 2.5 years and currently works with PT at facility. Dtr reports that facility provides care for patient and feels it is best fit for patient.    CSW called ALF and left a message in order to determine if patient can return at DC. CSW completed FL2 and will update prior to DC.   Assessment/plan status:  Psychosocial Support/Ongoing Assessment of Needs Other assessment/ plan:   Information/referral to community resources:   Will return to ALF?    PATIENT'S/FAMILY'S RESPONSE TO PLAN OF CARE: Patient unable to fully participate in assessment. Dtr engaged throughout assessment and appreciative of CSW call.       (Coverage for Genelle Bal)

## 2012-08-09 NOTE — Progress Notes (Signed)
Clinical Social Work  CSW faxed DC summary and FL2 to [facility] who is agreeable to accept patient today. CSW prepared DC packet with FL2 and DNR form included.  CSW informed patient and RN and left a message with dtr. CSW coordinated transportation via [facility]. (Request # [id number]). CSW is signing off but available if needed.  Unk Lightning, LCSW (Coverage for Genelle Bal)

## 2012-08-09 NOTE — Discharge Summary (Signed)
Addendum  Patient seen and examined, chart and data base reviewed.  I agree with the above assessment and plan.  For full details please see Mrs. Algis Downs PA note.   Clint Lipps, MD [facility] Pager: [phone] 08/09/2012, 1:55 PM

## 2012-08-09 NOTE — ED Provider Notes (Signed)
Medical screening examination/treatment/procedure(s) were conducted as a shared visit with non-physician practitioner(s) and myself.  I personally evaluated the patient during the encounter Pt with dementia, presents w altered ms, confusion. Labs. Ua. Pt alert, content. Chest cta. abd soft nt.   Suzi Roots, MD 08/09/12 [phone]

## 2012-08-18 ENCOUNTER — Emergency Department (HOSPITAL_COMMUNITY): Payer: Medicare Other

## 2012-08-18 ENCOUNTER — Inpatient Hospital Stay (HOSPITAL_COMMUNITY)
Admission: EM | Admit: 2012-08-18 | Discharge: 2012-08-20 | DRG: 689 | Disposition: A | Discharge status: Skilled Nursing Facility | Payer: Medicare Other | Attending: Internal Medicine | Admitting: Internal Medicine

## 2012-08-18 ENCOUNTER — Encounter (HOSPITAL_COMMUNITY): Payer: Self-pay | Admitting: Emergency Medicine

## 2012-08-18 ENCOUNTER — Inpatient Hospital Stay (HOSPITAL_COMMUNITY): Payer: Medicare Other

## 2012-08-18 DIAGNOSIS — N390 Urinary tract infection, site not specified: Principal | ICD-10-CM | POA: Diagnosis present

## 2012-08-18 DIAGNOSIS — E860 Dehydration: Secondary | ICD-10-CM | POA: Diagnosis present

## 2012-08-18 DIAGNOSIS — F411 Generalized anxiety disorder: Secondary | ICD-10-CM | POA: Diagnosis present

## 2012-08-18 DIAGNOSIS — Z87440 Personal history of urinary (tract) infections: Secondary | ICD-10-CM

## 2012-08-18 DIAGNOSIS — N179 Acute kidney failure, unspecified: Secondary | ICD-10-CM

## 2012-08-18 DIAGNOSIS — R410 Disorientation, unspecified: Secondary | ICD-10-CM

## 2012-08-18 DIAGNOSIS — D72829 Elevated white blood cell count, unspecified: Secondary | ICD-10-CM | POA: Diagnosis present

## 2012-08-18 DIAGNOSIS — F0280 Dementia in other diseases classified elsewhere without behavioral disturbance: Secondary | ICD-10-CM | POA: Diagnosis present

## 2012-08-18 DIAGNOSIS — R55 Syncope and collapse: Secondary | ICD-10-CM | POA: Diagnosis present

## 2012-08-18 DIAGNOSIS — Z79899 Other long term (current) drug therapy: Secondary | ICD-10-CM

## 2012-08-18 DIAGNOSIS — Z7982 Long term (current) use of aspirin: Secondary | ICD-10-CM

## 2012-08-18 DIAGNOSIS — Z66 Do not resuscitate: Secondary | ICD-10-CM | POA: Diagnosis present

## 2012-08-18 DIAGNOSIS — G9340 Encephalopathy, unspecified: Secondary | ICD-10-CM | POA: Diagnosis present

## 2012-08-18 DIAGNOSIS — I10 Essential (primary) hypertension: Secondary | ICD-10-CM | POA: Diagnosis present

## 2012-08-18 DIAGNOSIS — E039 Hypothyroidism, unspecified: Secondary | ICD-10-CM | POA: Diagnosis present

## 2012-08-18 DIAGNOSIS — F329 Major depressive disorder, single episode, unspecified: Secondary | ICD-10-CM | POA: Diagnosis present

## 2012-08-18 DIAGNOSIS — G309 Alzheimer's disease, unspecified: Secondary | ICD-10-CM | POA: Diagnosis present

## 2012-08-18 DIAGNOSIS — F3289 Other specified depressive episodes: Secondary | ICD-10-CM | POA: Diagnosis present

## 2012-08-18 DIAGNOSIS — I4891 Unspecified atrial fibrillation: Secondary | ICD-10-CM

## 2012-08-18 LAB — URINALYSIS, ROUTINE W REFLEX MICROSCOPIC
Bilirubin Urine: NEGATIVE
Nitrite: POSITIVE — AB
Protein, ur: NEGATIVE mg/dL
Specific Gravity, Urine: 1.015 (ref 1.005–1.030)
Urobilinogen, UA: 0.2 mg/dL (ref 0.0–1.0)

## 2012-08-18 LAB — CBC WITH DIFFERENTIAL/PLATELET
Eosinophils Absolute: 0.1 10*3/uL (ref 0.0–0.7)
Hemoglobin: 11.1 g/dL — ABNORMAL LOW (ref 12.0–15.0)
Lymphocytes Relative: 15 % (ref 12–46)
Lymphs Abs: 1.9 10*3/uL (ref 0.7–4.0)
MCH: 29.9 pg (ref 26.0–34.0)
MCV: 89.5 fL (ref 78.0–100.0)
Monocytes Relative: 5 % (ref 3–12)
Neutrophils Relative %: 79 % — ABNORMAL HIGH (ref 43–77)
RBC: 3.71 MIL/uL — ABNORMAL LOW (ref 3.87–5.11)

## 2012-08-18 LAB — COMPREHENSIVE METABOLIC PANEL
ALT: 18 U/L (ref 0–35)
AST: 36 U/L (ref 0–37)
CO2: 19 mEq/L (ref 19–32)
Calcium: 9.4 mg/dL (ref 8.4–10.5)
Chloride: 104 mEq/L (ref 96–112)
Creatinine, Ser: 1.27 mg/dL — ABNORMAL HIGH (ref 0.50–1.10)
GFR calc Af Amer: 42 mL/min — ABNORMAL LOW (ref >90)
GFR calc non Af Amer: 37 mL/min — ABNORMAL LOW (ref >90)
Glucose, Bld: 114 mg/dL — ABNORMAL HIGH (ref 70–99)
Sodium: 135 mEq/L (ref 135–145)
Total Bilirubin: 0.5 mg/dL (ref 0.3–1.2)

## 2012-08-18 LAB — URINE MICROSCOPIC-ADD ON

## 2012-08-18 LAB — POTASSIUM: Potassium: 3.8 mEq/L (ref 3.5–5.1)

## 2012-08-18 MED ORDER — ISOSORBIDE MONONITRATE 15 MG HALF TABLET
15.0000 mg | ORAL_TABLET | Freq: Every day | ORAL | Status: DC
  Administered 2012-08-18 – 2012-08-19 (×2): 15 mg via ORAL
  Filled 2012-08-18 (×3): qty 1

## 2012-08-18 MED ORDER — SODIUM CHLORIDE 0.9 % IV BOLUS (SEPSIS): 250.0000 mL | Freq: Once | INTRAVENOUS | Status: DC

## 2012-08-18 MED ORDER — TRAMADOL HCL 50 MG PO TABS
25.0000 mg | ORAL_TABLET | Freq: Four times a day (QID) | ORAL | Status: DC | PRN
Start: 2012-08-18 — End: 2012-08-20
  Administered 2012-08-18: 25 mg via ORAL
  Filled 2012-08-18: qty 1

## 2012-08-18 MED ORDER — KETOROLAC TROMETHAMINE 30 MG/ML IJ SOLN
15.0000 mg | Freq: Four times a day (QID) | INTRAMUSCULAR | Status: DC | PRN
  Administered 2012-08-18: 15 mg via INTRAVENOUS

## 2012-08-18 MED ORDER — ENOXAPARIN SODIUM 30 MG/0.3ML ~~LOC~~ SOLN
30.0000 mg | SUBCUTANEOUS | Status: DC
  Filled 2012-08-18 (×2): qty 0.3

## 2012-08-18 MED ORDER — KETOROLAC TROMETHAMINE 15 MG/ML IJ SOLN
15.0000 mg | Freq: Four times a day (QID) | INTRAMUSCULAR | Status: DC | PRN
  Filled 2012-08-18: qty 1

## 2012-08-18 MED ORDER — ONDANSETRON HCL 4 MG PO TABS: 4.0000 mg | ORAL_TABLET | Freq: Four times a day (QID) | ORAL | Status: DC | PRN

## 2012-08-18 MED ORDER — SODIUM CHLORIDE 0.9 % IV SOLN: 1000.0000 mL | Freq: Once | INTRAVENOUS | Status: DC

## 2012-08-18 MED ORDER — ASPIRIN 81 MG PO CHEW
81.0000 mg | CHEWABLE_TABLET | Freq: Every day | ORAL | Status: DC
  Administered 2012-08-18 – 2012-08-20 (×3): 81 mg via ORAL
  Filled 2012-08-18 (×3): qty 1

## 2012-08-18 MED ORDER — METOPROLOL TARTRATE 50 MG PO TABS
50.0000 mg | ORAL_TABLET | Freq: Two times a day (BID) | ORAL | Status: DC
  Administered 2012-08-20: 50 mg via ORAL
  Filled 2012-08-18 (×5): qty 1

## 2012-08-18 MED ORDER — ACETAMINOPHEN 325 MG PO TABS: 650.0000 mg | ORAL_TABLET | Freq: Four times a day (QID) | ORAL | Status: DC | PRN

## 2012-08-18 MED ORDER — SODIUM CHLORIDE 0.9 % IV SOLN
INTRAVENOUS | Status: DC
  Administered 2012-08-18: 17:00:00 via INTRAVENOUS
  Administered 2012-08-18: 75 mL/h via INTRAVENOUS

## 2012-08-18 MED ORDER — PIPERACILLIN-TAZOBACTAM 3.375 G IVPB 30 MIN
3.3750 g | Freq: Once | INTRAVENOUS | Status: AC
  Administered 2012-08-18: 3.375 g via INTRAVENOUS
  Filled 2012-08-18: qty 50

## 2012-08-18 MED ORDER — SODIUM CHLORIDE 0.9 % IV BOLUS (SEPSIS)
2000.0000 mL | Freq: Once | INTRAVENOUS | Status: AC
  Administered 2012-08-18: 2000 mL via INTRAVENOUS

## 2012-08-18 MED ORDER — LEVOTHYROXINE SODIUM 75 MCG PO TABS
75.0000 ug | ORAL_TABLET | Freq: Every day | ORAL | Status: DC
  Filled 2012-08-18: qty 1

## 2012-08-18 MED ORDER — ONDANSETRON HCL 4 MG/2ML IJ SOLN: 4.0000 mg | Freq: Four times a day (QID) | INTRAMUSCULAR | Status: DC | PRN

## 2012-08-18 MED ORDER — KETOROLAC TROMETHAMINE 15 MG/ML IJ SOLN: 7.5000 mg | Freq: Three times a day (TID) | INTRAMUSCULAR | Status: DC | PRN

## 2012-08-18 MED ORDER — ENSURE COMPLETE PO LIQD
237.0000 mL | Freq: Two times a day (BID) | ORAL | Status: DC
  Administered 2012-08-19: 237 mL via ORAL

## 2012-08-18 MED ORDER — BISACODYL 10 MG RE SUPP: 10.0000 mg | RECTAL | Status: DC | PRN

## 2012-08-18 MED ORDER — LEVOTHYROXINE SODIUM 100 MCG IV SOLR
37.5000 ug | Freq: Every day | INTRAVENOUS | Status: DC
  Administered 2012-08-18 – 2012-08-19 (×2): 37.5 ug via INTRAVENOUS
  Filled 2012-08-18 (×2): qty 5

## 2012-08-18 MED ORDER — LORAZEPAM 2 MG/ML IJ SOLN: 0.2500 mg | Freq: Four times a day (QID) | INTRAMUSCULAR | Status: DC | PRN

## 2012-08-18 MED ORDER — ALUM & MAG HYDROXIDE-SIMETH 200-200-20 MG/5ML PO SUSP: 30.0000 mL | Freq: Four times a day (QID) | ORAL | Status: DC | PRN

## 2012-08-18 MED ORDER — AMLODIPINE BESYLATE 10 MG PO TABS
10.0000 mg | ORAL_TABLET | Freq: Every day | ORAL | Status: DC
  Administered 2012-08-20: 10 mg via ORAL
  Filled 2012-08-18 (×3): qty 1

## 2012-08-18 MED ORDER — SODIUM CHLORIDE 0.9 % IV SOLN: 1000.0000 mL | INTRAVENOUS | Status: DC

## 2012-08-18 MED ORDER — ENOXAPARIN SODIUM 40 MG/0.4ML ~~LOC~~ SOLN: 40.0000 mg | SUBCUTANEOUS | Status: DC

## 2012-08-18 MED ORDER — METOPROLOL TARTRATE 1 MG/ML IV SOLN
5.0000 mg | Freq: Once | INTRAVENOUS | Status: DC
  Filled 2012-08-18: qty 5

## 2012-08-18 MED ORDER — HYDRALAZINE HCL 20 MG/ML IJ SOLN: 10.0000 mg | Freq: Three times a day (TID) | INTRAMUSCULAR | Status: DC | PRN

## 2012-08-18 MED ORDER — VANCOMYCIN HCL IN DEXTROSE 1-5 GM/200ML-% IV SOLN
1000.0000 mg | Freq: Once | INTRAVENOUS | Status: AC
  Administered 2012-08-18: 1000 mg via INTRAVENOUS
  Filled 2012-08-18: qty 200

## 2012-08-18 MED ORDER — LORAZEPAM 2 MG/ML IJ SOLN
0.5000 mg | INTRAMUSCULAR | Status: DC | PRN
  Administered 2012-08-18: 0.5 mg via INTRAVENOUS
  Filled 2012-08-18 (×2): qty 1

## 2012-08-18 MED ORDER — PIPERACILLIN-TAZOBACTAM 3.375 G IVPB
3.3750 g | Freq: Three times a day (TID) | INTRAVENOUS | Status: DC
  Administered 2012-08-19 (×3): 3.375 g via INTRAVENOUS
  Filled 2012-08-18 (×5): qty 50

## 2012-08-18 MED ORDER — TRAMADOL 5 MG/ML ORAL SUSPENSION: 25.0000 mg | Freq: Four times a day (QID) | ORAL | Status: DC | PRN

## 2012-08-18 NOTE — Progress Notes (Addendum)
ANTIBIOTIC CONSULT NOTE - INITIAL  Pharmacy Consult for Vancomycin Indication: Sepsis  Allergies  Allergen Reactions  . Amoxicillin-Pot Clavulanate Diarrhea  . Macrobid (Nitrofurantoin Macrocrystal)   . Nitrofurantoin Diarrhea    Family and Per nursing home mar  . Banana Flavor Rash  . Codeine Palpitations    Family states not a true allergy, Per nursing home mar  . Lidocaine Palpitations    Family states not a true allergy, Per nursing home mar    Patient Measurements:   Last documented weight ~ 67kg (08/07/12)  Vital Signs:   Intake/Output from previous day:    Labs: No results found for this basename: WBC, HGB, PLT, LABCREA, CREATININE,  in the last 72 hours The CrCl is unknown because both a height and weight (above a minimum accepted value) are required for this calculation.  Medical History: Past Medical History  Diagnosis Date  . Atrial fibrillation   . Unspecified essential hypertension   . Alzheimer disease   . UTI (urinary tract infection)   . Septic arthritis of wrist, left 06/2012    Medications:  Anti-infectives   None     Assessment: 89 yoF admit 7/11 from nursing home with AMD, diarrhea, and code sepsis.  Pharmacy asked to assist with IV Vancomycin dosing.  Recently inpatient 08/07/12 and treated with ceftriaxone for ecoli UTI and transitioned to Ceftin for outpatient treatment, finished 08/16/12.  Labs pending, last SCr 0.81  Goal of Therapy:  Vancomycin trough level 15-20 mcg/ml  Plan:   Vancomycin 1g IV x1 dose, then re-evaluate labs and current weight.  Per MD order, add one time dose of Zosyn to cover for potential UTI.  Noted Augmentin allergy (diarrhea) but pt has received Zosyn and cephalosporins during previous hospitalizations.    Lynann Beaver PharmD, BCPS Pager [phone] 08/18/2012 1:51 PM

## 2012-08-18 NOTE — H&P (Signed)
[facility] History and Physical  Kaitlyn Lucero EXB:[id number] DOB: 1921-04-03 DOA: 08/18/2012  Referring physician: Dr Effie Shy PCP: [name], MD  Specialists: Cardiology:Dr Wall  Chief Complaint: AMS  HPI: Kaitlyn Lucero is a 79 y.o. female with history of atrial fibrillation, hypertension, Alzheimer's dementia, recurrent UTIs recently admitted from 08/07/2012 to 08/10/2038 with a urinary tract infection. Patient was subsequently discharged to skilled nursing facility at [facility] with a course of antibiotics. Patient finished the course of antibiotics 2 days prior to admission. Patient presented on the day of admission with altered mental status per daughter. Patient is demented and a such is a poor historian and history obtained from patient's daughter in ED chart. Per patient's daughter she was called on the day of admission stating that patient was seen in the dining room with some confusion and kept slipping further further down her care with him lethargic and eventually passed out. Subsequently EMS was called the patient was brought to the ED. Per daughter she denies any recent fevers, no chills, no chest pain, no shortness of breath, no abdominal pain, no diarrhea, no constipation, no dysuria, no other associated symptoms her daughter. Patient also noted to be urinary incontinent. Patient was brought to the ED urinalysis done was consistent with a urinary tract infection. Compressive metabolic profile obtained did have a creatinine of 1.27 a potassium of 5.3. Repeat potassium of 3.8 otherwise was within normal limits. Lactic acid level was 2.18. The CBC had a white count of 12.9 hemoglobin of 11.1 otherwise was within normal limits. Chest x-ray was negative for any acute infiltrate. EKG showed atrial fibrillation. We were called to admit.  Review of Systems: The patient denies anorexia, fever, weight loss,, vision loss, decreased hearing, hoarseness, chest pain,  syncope, dyspnea on exertion, peripheral edema, balance deficits, hemoptysis, abdominal pain, melena, hematochezia, severe indigestion/heartburn, hematuria, incontinence, genital sores, muscle weakness, suspicious skin lesions, transient blindness, difficulty walking, depression, unusual weight change, abnormal bleeding, enlarged lymph nodes, angioedema, and breast masses.   Past Medical History  Diagnosis Date  . Atrial fibrillation   . Unspecified essential hypertension   . Alzheimer disease   . UTI (urinary tract infection)   . Septic arthritis of wrist, left 06/2012   Past Surgical History  Procedure Laterality Date  . Bladder suspension  1953  . Cesarean section  1961  . Abdominal hysterectomy  1964  . Bladder surgery  1977  . Cataract extraction  2003    left eye  . Irrigation and debridement abscess Left 06/21/2012    Procedure: irrigation and debridement left wrist;  Surgeon: Johnette Abraham, MD;  Location: [facility];  Service: Plastics;  Laterality: Left;  left wrist   Social History:  reports that she has never smoked. She has never used smokeless tobacco. She reports that she does not drink alcohol or use illicit drugs.  Allergies  Allergen Reactions  . Amoxicillin-Pot Clavulanate Diarrhea  . Macrobid (Nitrofurantoin Macrocrystal) Other (See Comments)    Per pt MAR  . Nitrofurantoin Diarrhea    Family and Per nursing home mar  . Banana Flavor Rash  . Codeine Palpitations    Family states not a true allergy, Per nursing home mar  . Lidocaine Palpitations    Family states not a true allergy, Per nursing home mar    Family History  Problem Relation Age of Onset  . Other Mother     lung and heart problems  . Other Father     pace maker, heart  problems  . Hypertension Brother   . Other Brother     CABG x3  . Hypertension Sister     Prior to Admission medications   Medication Sig Start Date End Date Taking? Authorizing Provider  acetaminophen (TYLENOL) 325 MG tablet  Take 2 tablets (650 mg total) by mouth every 6 (six) hours as needed. 08/09/12  Yes Marianne L York, PA-C  amLODipine (NORVASC) 10 MG tablet Take 1 tablet (10 mg total) by mouth daily. 07/08/12  Yes Penny Pia, MD  aspirin 81 MG chewable tablet Chew 81 mg by mouth daily.   Yes Historical Provider, MD  benazepril (LOTENSIN) 40 MG tablet Take 40 mg by mouth daily.   Yes Historical Provider, MD  bisacodyl (DULCOLAX) 10 MG suppository Place 1 suppository (10 mg total) rectally as needed for constipation. 08/09/12  Yes Marianne L York, PA-C  feeding supplement (ENSURE IMMUNE HEALTH) LIQD Take 237 mLs by mouth 2 (two) times daily. Vanilla flavor    Yes Historical Provider, MD  isosorbide mononitrate (IMDUR) 30 MG 24 hr tablet Take 15 mg by mouth daily.   Yes Historical Provider, MD  levothyroxine (SYNTHROID, LEVOTHROID) 75 MCG tablet Take 75 mcg by mouth daily before breakfast.   Yes Historical Provider, MD  metoprolol (LOPRESSOR) 50 MG tablet Take 50 mg by mouth 2 (two) times daily.   Yes Historical Provider, MD  ondansetron (ZOFRAN ODT) 4 MG disintegrating tablet Take 1 tablet (4 mg total) by mouth every 8 (eight) hours as needed for nausea. 08/09/12  Yes Tora Kindred York, PA-C  traMADol (ULTRAM) 50 MG tablet Take 1 tablet (50 mg total) by mouth every 8 (eight) hours as needed for pain. 08/09/12  Yes Stephani Police, PA-C   Physical Exam: Filed Vitals:   08/18/12 1525 08/18/12 1557 08/18/12 1700 08/18/12 1715  BP:  185/92    Pulse: 110 84 85 83  Temp:      TempSrc:      Resp: 23 34 19 17  Height:  5\' 1"  (1.549 m)    Weight:  66.9 kg (147 lb 7.8 oz)    SpO2:  99% 90% 82%     General:  Elderly female in no acute cardiopulmonary distress. Patient with confusion. Patient moaning.  Eyes: Patient would not open eyes. Extraocular movements intact.  ENT: Oropharynx is clear, no lesions, no exudates. Dry mucous membranes.  Neck: Supple with no lymphadenopathy.  Cardiovascular: Irregularly  irregular  Respiratory: Clear to auscultation bilaterally, no wheezes, no crackles, no rhonchi.  Abdomen: Soft, nontender, nondistended, positive bowel sounds.  Skin: No rashes or lesions.  Musculoskeletal: Unable to assess.  Psychiatric: Unable to assess.  Neurologic: Unable to assess. Moving extremities spontaneously.  Labs on Admission:  Basic Metabolic Panel:  Recent Labs Lab 08/18/12 1355 08/18/12 1626  NA 135  --   K 5.3* 3.8  CL 104  --   CO2 19  --   GLUCOSE 114*  --   BUN 21  --   CREATININE 1.27*  --   CALCIUM 9.4  --    Liver Function Tests:  Recent Labs Lab 08/18/12 1355  AST 36  ALT 18  ALKPHOS 60  BILITOT 0.5  PROT 7.1  ALBUMIN 3.5   No results found for this basename: LIPASE, AMYLASE,  in the last 168 hours No results found for this basename: AMMONIA,  in the last 168 hours CBC:  Recent Labs Lab 08/18/12 1500  WBC 12.9*  NEUTROABS 10.2*  HGB 11.1*  HCT 33.2*  MCV 89.5  PLT 285   Cardiac Enzymes: No results found for this basename: CKTOTAL, CKMB, CKMBINDEX, TROPONINI,  in the last 168 hours  BNP (last 3 results) No results found for this basename: PROBNP,  in the last 8760 hours CBG:  Recent Labs Lab 08/18/12 1341  GLUCAP 85    Radiological Exams on Admission: Dg Chest 1 View  08/18/2012   *RADIOLOGY REPORT*  Clinical Data: Diarrhea, agitation  CHEST - 1 VIEW  Comparison: 08/07/2012  Findings: Cardiomediastinal silhouette is unremarkable.  No acute infiltrate or pleural effusion.  No pulmonary edema.  Stable calcified granuloma in the right lower lobe.  IMPRESSION: No active disease.   Original Report Authenticated By: Natasha Mead, M.D.    EKG: Independently reviewed. Atrial fibrillation  Assessment/Plan Principal Problem:   Acute encephalopathy Active Problems:   ALZHEIMERS DISEASE   HYPERTENSION   PAROXYSMAL ATRIAL FIBRILLATION   UTI (lower urinary tract infection)   Hypothyroidism   AKI (acute kidney injury)    Dehydration   Syncope   #1 acute encephalopathy Questionable etiology. Likely metabolic encephalopathy secondary to recurrent urinary tract infections. Urinalysis is positive for UTI. Patient recently treated for Escherichia coli UTI and just finished a course of antibiotics 2 days prior to admission. Patient is moving extremities spontaneously doubt is of neurological etiology. Patient also noted to have a leukocytosis. Chest x-ray was negative for any acute infiltrate. Blood cultures were obtained and are pending. Will place patient empirically on IV Zosyn while cultures are pending. Will also check a C. difficile PCR. Follow. If no significant improvement in the next 24-48 hours may consider imaging of the head.  #2 recurrent urinary tract infections Patient has had multiple recurrent urinary tract infections resulting in hospitalizations most recently June of 2014 in May of 2014. Questionable etiology. Patient's creatinine is slightly elevated today likely secondary to dehydration/prerenal azotemia and a such unable to obtain a CT scan of the abdomen and pelvis at this time. Will check a renal ultrasound. Urine cultures are pending. Will place empirically on IV Zosyn. Will consult with urology for further evaluation and management. Spoke with Dr. Isabel Caprice and patient will be seen in consultation.  #3 dehydration IV fluids. Follow.  #4 acute kidney injury Likely secondary to prerenal azotemia in the setting of ACE inhibitor. Will hold ACE inhibitor for now. Hydrate with IV fluids and follow. Check a urine sodium and a urine creatinine. Check a renal ultrasound.  #5 questionable syncope Information was not in the chart from the nursing home concerning syncope. Patient however is 79 years old and the family does not want any heroic measures. Will monitor patient for now. Will treat patient for recurrent UTIs. Will panculture the patient. And follow. IV fluids. Monitor.  #6 hypertension Continue  Norvasc and Lopressor.  #7 atrial fibrillation Continue Lopressor for rate control. Patient is not an anticoagulation candidate secondary to her history of dementia. Aspirin.  #8. Alzheimer's dementia Stable. Will likely need a dementia unit/care on discharge.  #9 prophylaxis Lovenox for DVT prophylaxis.   Code Status: DO NOT RESUSCITATE. Family Communication: Updated daughter at bedside. Disposition Plan: Admit to MedSurg.  Time spent: 25 MINS  [facility] Pager [phone]  If 7PM-7AM, please contact night-coverage [email or web address] Password [facility] 08/18/2012, 5:58 PM

## 2012-08-18 NOTE — ED Notes (Signed)
Patient transported to X-ray 

## 2012-08-18 NOTE — Progress Notes (Signed)
ANTIBIOTIC CONSULT NOTE - INITIAL  Pharmacy Consult for Zosyn Indication: Sepsis  Allergies  Allergen Reactions  . Amoxicillin-Pot Clavulanate Diarrhea  . Macrobid (Nitrofurantoin Macrocrystal) Other (See Comments)    Per pt MAR  . Nitrofurantoin Diarrhea    Family and Per nursing home mar  . Banana Flavor Rash  . Codeine Palpitations    Family states not a true allergy, Per nursing home mar  . Lidocaine Palpitations    Family states not a true allergy, Per nursing home mar    Patient Measurements: Height: 5\' 1"  (154.9 cm) Weight: 147 lb 7.8 oz (66.9 kg) (Value brought forward from 08/07/12. Needs to be remeasured.) IBW/kg (Calculated) : 47.8 Last documented weight ~ 67kg (08/07/12)  Vital Signs: Temp: 97.9 F (36.6 C) (07/11 1428) Temp src: Rectal (07/11 1428) BP: 185/92 mmHg (07/11 1557) Pulse Rate: 83 (07/11 1715) Intake/Output from previous day:    Labs:  Recent Labs  08/18/12 1355 08/18/12 1500  WBC  --  12.9*  HGB  --  11.1*  PLT  --  285  CREATININE 1.27*  --    Estimated Creatinine Clearance: 26.8 ml/min (by C-G formula based on Cr of 1.27).  Medical History: Past Medical History  Diagnosis Date  . Atrial fibrillation   . Unspecified essential hypertension   . Alzheimer disease   . UTI (urinary tract infection)   . Septic arthritis of wrist, left 06/2012    Medications:  Anti-infectives   Start     Dose/Rate Route Frequency Ordered Stop   08/18/12 1415  vancomycin (VANCOCIN) IVPB 1000 mg/200 mL premix     1,000 mg 200 mL/hr over 60 Minutes Intravenous  Once 08/18/12 1400 08/18/12 1733   08/18/12 1415  piperacillin-tazobactam (ZOSYN) IVPB 3.375 g     3.375 g 100 mL/hr over 30 Minutes Intravenous  Once 08/18/12 1400 08/18/12 1605     Assessment: 79 yo F admit 7/11 from nursing home with AMS, diarrhea, and code sepsis.  Pharmacy originally asked to assist with IV Vancomycin dosing.  Recently inpatient 08/07/12 and treated with ceftriaxone for  ecoli UTI and transitioned to Ceftin for outpatient treatment, finished 08/16/12.  MD suspects recurrent UTI although Ceftin should have been adequate therapy. Switching abx to Zosyn pending new cultures.  SCr elevated at 1.27, CrCl 27CG, 34N.  Goal of Therapy:  Eradication of infection. Regimen adjusted for renal function.  Plan:   Zosyn 3.375g IV Q8H infused over 4hrs.  "Allergy" to Augmentin reported as diarrhea. Follow up renal fxn and culture results. MD consider checking C.diff PCR due to recent abx use.  Charolotte Eke, PharmD, pager [phone]. 08/18/2012,6:07 PM.

## 2012-08-18 NOTE — ED Notes (Signed)
rn and charge rn attempted IV, unsuccessful. IV team paged

## 2012-08-18 NOTE — ED Notes (Signed)
[id number]<AV> Expected date:<BR> Expected time:<BR> Means of arrival:<BR> Comments:<BR> ems 78 yo, altered today, possible UTI

## 2012-08-18 NOTE — ED Provider Notes (Signed)
History    CSN: [id number] Arrival date & time 08/18/12  1257  First MD Initiated Contact with Patient 08/18/12 1307     Chief Complaint  Patient presents with  . Altered Mental Status   (Consider location/radiation/quality/duration/timing/severity/associated sxs/prior Treatment) HPI Comments: Kaitlyn Lucero is a 79 y.o. Female reportedly sent here for altered mental status. She lives at a skilled nursing facility. She  Was recently discharged from the hospital after treatment for urinary tract infection. She is unable to give any history.   Level V caveat- dementia  The history is provided by the nursing home.   Past Medical History  Diagnosis Date  . Atrial fibrillation   . Unspecified essential hypertension   . Alzheimer disease   . UTI (urinary tract infection)   . Septic arthritis of wrist, left 06/2012   Past Surgical History  Procedure Laterality Date  . Bladder suspension  1953  . Cesarean section  1961  . Abdominal hysterectomy  1964  . Bladder surgery  1977  . Cataract extraction  2003    left eye  . Irrigation and debridement abscess Left 06/21/2012    Procedure: irrigation and debridement left wrist;  Surgeon: Johnette Abraham, MD;  Location: [facility];  Service: Plastics;  Laterality: Left;  left wrist   Family History  Problem Relation Age of Onset  . Other Mother     lung and heart problems  . Other Father     pace maker, heart problems  . Hypertension Brother   . Other Brother     CABG x3  . Hypertension Sister    History  Substance Use Topics  . Smoking status: Never Smoker   . Smokeless tobacco: Never Used  . Alcohol Use: No   OB History   Grav Para Term Preterm Abortions TAB SAB Ect Mult Living                 Review of Systems  All other systems reviewed and are negative.    Allergies  Amoxicillin-pot clavulanate; Macrobid; Nitrofurantoin; Banana flavor; Codeine; and Lidocaine  Home Medications   Current Outpatient Rx  Name  Route   Sig  Dispense  Refill  . acetaminophen (TYLENOL) 325 MG tablet   Oral   Take 2 tablets (650 mg total) by mouth every 6 (six) hours as needed.         Marland Kitchen amLODipine (NORVASC) 10 MG tablet   Oral   Take 1 tablet (10 mg total) by mouth daily.   30 tablet   0   . aspirin 81 MG chewable tablet   Oral   Chew 81 mg by mouth daily.         . benazepril (LOTENSIN) 40 MG tablet   Oral   Take 40 mg by mouth daily.         . bisacodyl (DULCOLAX) 10 MG suppository   Rectal   Place 1 suppository (10 mg total) rectally as needed for constipation.   12 suppository   0   . feeding supplement (ENSURE IMMUNE HEALTH) LIQD   Oral   Take 237 mLs by mouth 2 (two) times daily. Vanilla flavor          . isosorbide mononitrate (IMDUR) 30 MG 24 hr tablet   Oral   Take 15 mg by mouth daily.         Marland Kitchen levothyroxine (SYNTHROID, LEVOTHROID) 75 MCG tablet   Oral   Take 75 mcg by mouth daily before  breakfast.         . metoprolol (LOPRESSOR) 50 MG tablet   Oral   Take 50 mg by mouth 2 (two) times daily.         . ondansetron (ZOFRAN ODT) 4 MG disintegrating tablet   Oral   Take 1 tablet (4 mg total) by mouth every 8 (eight) hours as needed for nausea.   20 tablet   0   . traMADol (ULTRAM) 50 MG tablet   Oral   Take 1 tablet (50 mg total) by mouth every 8 (eight) hours as needed for pain.   30 tablet   0    BP 185/92  Pulse 84  Temp(Src) 97.9 F (36.6 C) (Rectal)  Resp 34  SpO2 99% Physical Exam  Nursing note and vitals reviewed. Constitutional: She appears well-developed.  Frail, elderly  HENT:  Head: Normocephalic and atraumatic.  Eyes: Conjunctivae and EOM are normal. Pupils are equal, round, and reactive to light.  Neck: Normal range of motion and phonation normal. Neck supple.  Cardiovascular: Normal rate, regular rhythm and intact distal pulses.   Pulmonary/Chest: Effort normal and breath sounds normal. She exhibits no tenderness.  Abdominal: Soft. She exhibits  no distension. There is tenderness (diffuse, mild). There is no guarding.  Musculoskeletal: Normal range of motion.  Neurological: She is alert. She has normal strength. She exhibits normal muscle tone.  Skin: Skin is warm and dry.  Psychiatric: She has a normal mood and affect.    ED Course  Procedures (including critical care time) On arrival to the examining room; she had a large loose bowel movement, brown in color.  Medications  0.9 %  sodium chloride infusion ( Intravenous New Bag/Given 08/18/12 1635)  vancomycin (VANCOCIN) IVPB 1000 mg/200 mL premix (1,000 mg Intravenous New Bag/Given 08/18/12 1633)  LORazepam (ATIVAN) injection 0.5 mg (0.5 mg Intravenous Given 08/18/12 1627)  sodium chloride 0.9 % bolus 2,000 mL (0 mLs Intravenous Stopped 08/18/12 1635)  piperacillin-tazobactam (ZOSYN) IVPB 3.375 g (0 g Intravenous Stopped 08/18/12 1605)   Patient Vitals for the past 24 hrs:  BP Temp Temp src Pulse Resp SpO2  08/18/12 1557 185/92 mmHg - - 84 34 99 %  08/18/12 1525 - - - 110 23 -  08/18/12 1428 - 97.9 F (36.6 C) Rectal - - -  08/18/12 1404 - - - - 21 -  08/18/12 1351 133/94 mmHg - - - - -  08/18/12 1338 - - - 63 - 98 %     Treated with empiric Antibiotics for urinary tract infection and the slight possibility of C. difficile sepsis.   Date: 08/18/12- 1353  Rate: 80  Rhythm: indeterminate  QRS Axis: normal  PR and QT Intervals: QT prolonged  ST/T Wave abnormalities: normal  PR and QRS Conduction Disutrbances:left bundle branch block  Narrative Interpretation: marked artifact  Old EKG Reviewed: essentially unchanged from 08/07/12   1607-  Note, K+ elevated at 5.3, with hemolysis, present. Will repeat.  4:48 PM-Consult complete with Hospitalist, Dr. Janee Morn. Patient case explained and discussed. He agrees to admit patient for further evaluation and treatment. Call ended at 1701   Labs Reviewed  COMPREHENSIVE METABOLIC PANEL - Abnormal; Notable for the following:     Potassium 5.3 (*)    Glucose, Bld 114 (*)    Creatinine, Ser 1.27 (*)    GFR calc non Af Amer 37 (*)    GFR calc Af Amer 42 (*)    All other components within normal limits  URINALYSIS, ROUTINE W REFLEX MICROSCOPIC - Abnormal; Notable for the following:    APPearance CLOUDY (*)    Hgb urine dipstick SMALL (*)    Ketones, ur 15 (*)    Nitrite POSITIVE (*)    Leukocytes, UA SMALL (*)    All other components within normal limits  CBC WITH DIFFERENTIAL - Abnormal; Notable for the following:    WBC 12.9 (*)    RBC 3.71 (*)    Hemoglobin 11.1 (*)    HCT 33.2 (*)    Neutrophils Relative % 79 (*)    Neutro Abs 10.2 (*)    All other components within normal limits  URINE MICROSCOPIC-ADD ON - Abnormal; Notable for the following:    Bacteria, UA MANY (*)    All other components within normal limits  CULTURE, BLOOD (ROUTINE X 2)  CULTURE, BLOOD (ROUTINE X 2)  URINE CULTURE  GLUCOSE, CAPILLARY  CBC WITH DIFFERENTIAL  POTASSIUM  CG4 I-STAT (LACTIC ACID)   Dg Chest 1 View  08/18/2012   *RADIOLOGY REPORT*  Clinical Data: Diarrhea, agitation  CHEST - 1 VIEW  Comparison: 08/07/2012  Findings: Cardiomediastinal silhouette is unremarkable.  No acute infiltrate or pleural effusion.  No pulmonary edema.  Stable calcified granuloma in the right lower lobe.  IMPRESSION: No active disease.   Original Report Authenticated By: Natasha Mead, M.D.   1. UTI (lower urinary tract infection)   2. Confusion     MDM  Altered mental status with likely persistent UTI, but no evidence for sepsis. Suspect hyperkalemia is secondary to hemolysis. Urinalysis is consistent with urinary tract infection. Patient will likely be admitted overnight for observation and possibly an admission for further evaluation of recurrent urinary tract infections.   Nursing Notes Reviewed/ Care Coordinated, and agree without changes. Applicable Imaging Reviewed.  Interpretation of Laboratory Data incorporated into ED  treatment   Plan: Admit       Flint Melter, MD 08/18/12 [phone]

## 2012-08-18 NOTE — ED Notes (Signed)
Per EMS pt from [facility] staff called because she had altered mental status. Pt just ended treatment for UTI this week. Pt had finished Antibiotic treatment on 08/16/2012. Staff reports altered status started today around 12pm.

## 2012-08-18 NOTE — Progress Notes (Signed)
Patient NW:GNFAOZHY Mcevoy      DOB: [birth year]      QMV:[id number]  Consult for Goals of Care received. Discussed case with Dr. Janee Morn.  Left a voice message for the patient's daughter Kaitlyn Lucero to call me to schedule a time this weekend.  [name], MD MBA The Palliative Medicine Team at [facility] Phone: [phone] Pager: [phone]

## 2012-08-18 NOTE — Progress Notes (Signed)
Rx Brief Lovenox note\  Wt= 66.9 kg, CrCl~27 ml/min Adjusted Lovenox to 30mg  daily in pt with CrCl<30 ml.min  Lorenza Evangelist 08/18/2012 6:32 PM

## 2012-08-18 NOTE — Progress Notes (Signed)
Pt has dementia. Proxy forms given to daughter. Briscoe Burns BSN, RN-BC Admissions RN  08/18/2012 2:14 PM

## 2012-08-18 NOTE — ED Notes (Signed)
CBG 85

## 2012-08-19 DIAGNOSIS — D72829 Elevated white blood cell count, unspecified: Secondary | ICD-10-CM | POA: Diagnosis present

## 2012-08-19 LAB — BASIC METABOLIC PANEL
Calcium: 8.4 mg/dL (ref 8.4–10.5)
Creatinine, Ser: 1.03 mg/dL (ref 0.50–1.10)
GFR calc Af Amer: 55 mL/min — ABNORMAL LOW (ref >90)
GFR calc non Af Amer: 47 mL/min — ABNORMAL LOW (ref >90)

## 2012-08-19 LAB — CBC
MCV: 88.1 fL (ref 78.0–100.0)
Platelets: 228 10*3/uL (ref 150–400)
RDW: 14.6 % (ref 11.5–15.5)
WBC: 8.4 10*3/uL (ref 4.0–10.5)

## 2012-08-19 LAB — TSH: TSH: 0.44 u[IU]/mL (ref 0.350–4.500)

## 2012-08-19 LAB — MAGNESIUM: Magnesium: 1.8 mg/dL (ref 1.5–2.5)

## 2012-08-19 MED ORDER — LEVOTHYROXINE SODIUM 50 MCG PO TABS: 50.0000 ug | ORAL_TABLET | Freq: Every day | ORAL | Status: DC

## 2012-08-19 MED ORDER — LEVOTHYROXINE SODIUM 75 MCG PO TABS
75.0000 ug | ORAL_TABLET | Freq: Every day | ORAL | Status: DC
  Administered 2012-08-20: 75 ug via ORAL
  Filled 2012-08-19 (×2): qty 1

## 2012-08-19 MED ORDER — LORAZEPAM 0.5 MG PO TABS: 0.5000 mg | ORAL_TABLET | Freq: Four times a day (QID) | ORAL | Status: DC | PRN

## 2012-08-19 MED ORDER — ENOXAPARIN SODIUM 40 MG/0.4ML ~~LOC~~ SOLN
40.0000 mg | SUBCUTANEOUS | Status: DC
  Filled 2012-08-19 (×2): qty 0.4

## 2012-08-19 MED ORDER — CEFUROXIME AXETIL 500 MG PO TABS
500.0000 mg | ORAL_TABLET | Freq: Two times a day (BID) | ORAL | Status: DC
  Administered 2012-08-20: 500 mg via ORAL
  Filled 2012-08-19 (×4): qty 1

## 2012-08-19 NOTE — Consult Note (Signed)
Urology Consult  Referring physician: Ramiro Harvest, M.D. Reason for referral: Recurrent urinary tract infections  History of Present Illness: Ms. Steuart is currently 79 years of age. She is currently being admitted with presumptive recurrent urinary tract infection after being noted to have change in mental status. Urology services were consulted due to multiple recurrent infections over the last 12-18 months. She has had multiple admissions to the hospital. Currently the patient is in the room by or self without family members. She has significant issues with dementia and is unable to provide any reliable history. She was lethargic but arousable. History was obtained from the medical record. The patient was recently discharged from the hospital having had again a documented Escherichia coli urinary tract infection/possible mild urosepsis picture. She has had increased confusion and was brought back to the hospital by EMS. What blood cell count was slightly elevated. Urinalysis showed a relatively small amount of pyuria with some bacteriuria. Given her prior history recurrent urinary tract infection is suspected.  The patient's daughter apparently requested urologic consultation. Other than recurrent infections it does not appear that she has a definitive prior urologic history. A renal ultrasound was done at the time of admission which showed normal-appearing bilateral renal units without evidence of hydronephrosis nephrolithiasis or other significant pathology. As a baseline the patient does have urinary incontinence.  Past Medical History  Diagnosis Date  . Atrial fibrillation   . Unspecified essential hypertension   . Alzheimer disease   . UTI (urinary tract infection)   . Septic arthritis of wrist, left 06/2012   Past Surgical History  Procedure Laterality Date  . Bladder suspension  1953  . Cesarean section  1961  . Abdominal hysterectomy  1964  . Bladder surgery  1977  . Cataract  extraction  2003    left eye  . Irrigation and debridement abscess Left 06/21/2012    Procedure: irrigation and debridement left wrist;  Surgeon: Johnette Abraham, MD;  Location: [facility];  Service: Plastics;  Laterality: Left;  left wrist    Medications:  Scheduled: . amLODipine  10 mg Oral Daily  . aspirin  81 mg Oral Daily  . enoxaparin (LOVENOX) injection  30 mg Subcutaneous Q24H  . feeding supplement  237 mL Oral BID  . isosorbide mononitrate  15 mg Oral Daily  . levothyroxine  37.5 mcg Intravenous Daily  . metoprolol  5 mg Intravenous Once  . metoprolol  50 mg Oral BID  . piperacillin-tazobactam (ZOSYN)  IV  3.375 g Intravenous Q8H    Allergies:  Allergies  Allergen Reactions  . Amoxicillin-Pot Clavulanate Diarrhea  . Macrobid (Nitrofurantoin Macrocrystal) Other (See Comments)    Per pt MAR  . Nitrofurantoin Diarrhea    Family and Per nursing home mar  . Banana Flavor Rash  . Codeine Palpitations    Family states not a true allergy, Per nursing home mar  . Lidocaine Palpitations    Family states not a true allergy, Per nursing home mar    Family History  Problem Relation Age of Onset  . Other Mother     lung and heart problems  . Other Father     pace maker, heart problems  . Hypertension Brother   . Other Brother     CABG x3  . Hypertension Sister     Social History:  reports that she has never smoked. She has never used smokeless tobacco. She reports that she does not drink alcohol or use illicit drugs.  Review of systems:    Unable to obtain accurate review of systems given the patient's current mental status/dementia.  Physical Exam:  Vital signs in last 24 hours: Temp:  [97.9 F (36.6 C)-100.2 F (37.9 C)] 98.2 F (36.8 C) (07/12 0515) Pulse Rate:  [63-110] 77 (07/12 0515) Resp:  [17-34] 21 (07/12 0515) BP: (104-185)/(48-94) 128/50 mmHg (07/12 0515) SpO2:  [82 %-99 %] 99 % (07/12 0515) Weight:  [66.9 kg (147 lb 7.8 oz)] 66.9 kg (147 lb 7.8 oz)  (07/11 1557)  Constitutional: Vital signs reviewed. WD WN in NAD Head: Normocephalic and atraumatic   Eyes: PERRL, No scleral icterus. t.  Cardiovascular: Irregularly irregular Pulmonary/Chest: Normal effort Abdominal: Soft. Non-tender, non-distended. No CVA tenderness.  Genitourinary: Not examined Extremities: No cyanosis or edema  Neurological: Grossly non-focal.  Skin: Warm,very dry and intact. No rash, cyanosis   Laboratory Data:  Results for orders placed during the hospital encounter of 08/18/12 (from the past 72 hour(s))  GLUCOSE, CAPILLARY     Status: None   Collection Time    08/18/12  1:41 PM      Result Value Range   Glucose-Capillary 85  70 - 99 mg/dL  COMPREHENSIVE METABOLIC PANEL     Status: Abnormal   Collection Time    08/18/12  1:55 PM      Result Value Range   Sodium 135  135 - 145 mEq/L   Potassium 5.3 (*) 3.5 - 5.1 mEq/L   Comment: MODERATE     HEMOLYSIS AT THIS LEVEL MAY AFFECT RESULT   Chloride 104  96 - 112 mEq/L   CO2 19  19 - 32 mEq/L   Glucose, Bld 114 (*) 70 - 99 mg/dL   BUN 21  6 - 23 mg/dL   Creatinine, Ser 1.61 (*) 0.50 - 1.10 mg/dL   Calcium 9.4  8.4 - 09.6 mg/dL   Total Protein 7.1  6.0 - 8.3 g/dL   Albumin 3.5  3.5 - 5.2 g/dL   AST 36  0 - 37 U/L   ALT 18  0 - 35 U/L   Comment: MODERATE HEMOLYSIS     HEMOLYSIS AT THIS LEVEL MAY AFFECT RESULT   Alkaline Phosphatase 60  39 - 117 U/L   Comment: MODERATE HEMOLYSIS     HEMOLYSIS AT THIS LEVEL MAY AFFECT RESULT   Total Bilirubin 0.5  0.3 - 1.2 mg/dL   GFR calc non Af Amer 37 (*) >90 mL/min   GFR calc Af Amer 42 (*) >90 mL/min   Comment:            The eGFR has been calculated     using the CKD EPI equation.     This calculation has not been     validated in all clinical     situations.     eGFR's persistently     <90 mL/min signify     possible Chronic Kidney Disease.  CG4 I-STAT (LACTIC ACID)     Status: None   Collection Time    08/18/12  2:58 PM      Result Value Range   Lactic  Acid, Venous 2.18  0.5 - 2.2 mmol/L  CBC WITH DIFFERENTIAL     Status: Abnormal   Collection Time    08/18/12  3:00 PM      Result Value Range   WBC 12.9 (*) 4.0 - 10.5 K/uL   RBC 3.71 (*) 3.87 - 5.11 MIL/uL   Hemoglobin 11.1 (*) 12.0 - 15.0 g/dL  HCT 33.2 (*) 36.0 - 46.0 %   MCV 89.5  78.0 - 100.0 fL   MCH 29.9  26.0 - 34.0 pg   MCHC 33.4  30.0 - 36.0 g/dL   RDW 41.3  24.4 - 01.0 %   Platelets 285  150 - 400 K/uL   Neutrophils Relative % 79 (*) 43 - 77 %   Neutro Abs 10.2 (*) 1.7 - 7.7 K/uL   Lymphocytes Relative 15  12 - 46 %   Lymphs Abs 1.9  0.7 - 4.0 K/uL   Monocytes Relative 5  3 - 12 %   Monocytes Absolute 0.7  0.1 - 1.0 K/uL   Eosinophils Relative 1  0 - 5 %   Eosinophils Absolute 0.1  0.0 - 0.7 K/uL   Basophils Relative 0  0 - 1 %   Basophils Absolute 0.1  0.0 - 0.1 K/uL  URINALYSIS, ROUTINE W REFLEX MICROSCOPIC     Status: Abnormal   Collection Time    08/18/12  3:34 PM      Result Value Range   Color, Urine YELLOW  YELLOW   APPearance CLOUDY (*) CLEAR   Specific Gravity, Urine 1.015  1.005 - 1.030   pH 6.0  5.0 - 8.0   Glucose, UA NEGATIVE  NEGATIVE mg/dL   Hgb urine dipstick SMALL (*) NEGATIVE   Bilirubin Urine NEGATIVE  NEGATIVE   Ketones, ur 15 (*) NEGATIVE mg/dL   Protein, ur NEGATIVE  NEGATIVE mg/dL   Urobilinogen, UA 0.2  0.0 - 1.0 mg/dL   Nitrite POSITIVE (*) NEGATIVE   Leukocytes, UA SMALL (*) NEGATIVE  URINE MICROSCOPIC-ADD ON     Status: Abnormal   Collection Time    08/18/12  3:34 PM      Result Value Range   Squamous Epithelial / LPF RARE  RARE   WBC, UA 7-10  <3 WBC/hpf   RBC / HPF 3-6  <3 RBC/hpf   Bacteria, UA MANY (*) RARE  POTASSIUM     Status: None   Collection Time    08/18/12  4:26 PM      Result Value Range   Potassium 3.8  3.5 - 5.1 mEq/L   Comment: DELTA CHECK NOTED     REPEATED TO VERIFY     NO VISIBLE HEMOLYSIS  MAGNESIUM     Status: None   Collection Time    08/19/12  4:15 AM      Result Value Range   Magnesium 1.8  1.5  - 2.5 mg/dL  BASIC METABOLIC PANEL     Status: Abnormal   Collection Time    08/19/12  4:15 AM      Result Value Range   Sodium 139  135 - 145 mEq/L   Potassium 3.2 (*) 3.5 - 5.1 mEq/L   Chloride 108  96 - 112 mEq/L   CO2 19  19 - 32 mEq/L   Glucose, Bld 98  70 - 99 mg/dL   BUN 14  6 - 23 mg/dL   Creatinine, Ser 2.72  0.50 - 1.10 mg/dL   Calcium 8.4  8.4 - 53.6 mg/dL   GFR calc non Af Amer 47 (*) >90 mL/min   GFR calc Af Amer 55 (*) >90 mL/min   Comment:            The eGFR has been calculated     using the CKD EPI equation.     This calculation has not been     validated in all  clinical     situations.     eGFR's persistently     <90 mL/min signify     possible Chronic Kidney Disease.  CBC     Status: Abnormal   Collection Time    08/19/12  4:15 AM      Result Value Range   WBC 8.4  4.0 - 10.5 K/uL   RBC 3.29 (*) 3.87 - 5.11 MIL/uL   Hemoglobin 10.0 (*) 12.0 - 15.0 g/dL   HCT 40.9 (*) 81.1 - 91.4 %   MCV 88.1  78.0 - 100.0 fL   MCH 30.4  26.0 - 34.0 pg   MCHC 34.5  30.0 - 36.0 g/dL   RDW 78.2  95.6 - 21.3 %   Platelets 228  150 - 400 K/uL   No results found for this or any previous visit (from the past 240 hour(s)). Creatinine:  Recent Labs  08/18/12 1355 08/19/12 0415  CREATININE 1.27* 1.03   Baseline Creatinine:   Impression/Assessment:  79 year old institutionalized female with significant dementia now with history of multiple hospitalizations secondary to recurrent cystitis with mental status changes. She does not have gross structural abnormalities of her upper urinary tract.  Plan:  Chronic/recurrent bacteriuria is exceedingly common in institutionalized females over age 74. At any given time she is going to have an exceedingly high likelihood of having some chronic bacteriuria. Etiology for this is multifactorial and often related to marked vaginal atrophy due to hormonal loss, incomplete bladder emptying, fecal soiling, urinary incontinence and more  aggressive bacterial colonization due to frequent hospitalizations and institutionalization. She does not have any gross structural abnormalities on renal ultrasound. Cystoscopy in this type a setting rarely yields any usable information. Unfortunately there are note easy solutions to this problem. At discharge she certainly could be On a low dose suppressive antibiotic for 6-12 months to see if that would help reduce the frequency/incidence of medically significant infections. Obviously given the significant amount of antibiotic use in this patient over the last 6-12 months she is at very high risk for developing more resistant/aggressive organisms as well as complications such as C. difficile colitis. She could be sent back to her nursing home eventually on suppressive therapy with something like trimethoprim/single strength Septra or low-dose Keflex. I will attempt to discuss things with the patient's daughter.  Dezi Brauner S 08/19/2012, 9:15 AM

## 2012-08-19 NOTE — Evaluation (Signed)
Physical Therapy Evaluation Patient Details Name: Kaitlyn Lucero MRN: [id number] DOB: Oct 31, 1921 Today's Date: 08/19/2012 Time: 4696-2952 PT Time Calculation (min): 15 min  PT Assessment / Plan / Recommendation History of Present Illness  Pt is an 79 year old female admitted from SNF for AMS with recurrent UTI and PMHx of L septic wrist arthritis, UTIs, Alzheimer's disease, dementia.  Clinical Impression  Pt appears deconditioined and was incontinent of urine.  Feel she would benefit from PT at SNF to encourage automatic movement patterns and gait to improve her functional mobility tolerance and decrease her burden of care    PT Assessment  Patient needs continued PT services    Follow Up Recommendations  SNF    Does the patient have the potential to tolerate intense rehabilitation      Barriers to Discharge        Equipment Recommendations  None recommended by PT    Recommendations for Other Services     Frequency Min 3X/week    Precautions / Restrictions Precautions Precautions: Fall Precaution Comments: baseline dementia   Pertinent Vitals/Pain No c/o pain      Mobility  Bed Mobility Bed Mobility: Rolling Right;Rolling Left Rolling Right: 4: Min assist Rolling Left: 4: Min assist Right Sidelying to Sit: 4: Min assist Sit to Supine: 4: Min assist Details for Bed Mobility Assistance: pt was restless moving in the bed , trying to get up so she was able to do so with min assist for safety.  If she did not want to get to edge of bed it would be difficult to get her to do so due to cognitive impairment Transfers Transfers: Sit to Stand;Stand to Sit Sit to Stand: 4: Min assist Stand to Sit: 4: Min assist Ambulation/Gait Ambulation/Gait Assistance: 4: Min assist Ambulation Distance (Feet): 3 Feet Assistive device: 1 person hand held assist Ambulation/Gait Assistance Details: hand hold assis to steady her General Gait Details: pt was only able to stand and take a few  steps before she wanted to return to supine.  Pt appears weak and deconditioned Stairs: No Wheelchair Mobility Wheelchair Mobility: No    Exercises     PT Diagnosis: Difficulty walking;Generalized weakness  PT Problem List: Decreased activity tolerance;Decreased balance;Decreased mobility;Decreased safety awareness;Decreased strength PT Treatment Interventions: Gait training;Functional mobility training;Therapeutic activities;Therapeutic exercise     PT Goals(Current goals can be found in the care plan section) Acute Rehab PT Goals Patient Stated Goal: Pt did not state during session. PT Goal Formulation: Patient unable to participate in goal setting Time For Goal Achievement: 09/02/12 Potential to Achieve Goals: Fair  Visit Information  Last PT Received On: 08/19/12 Assistance Needed: +2 History of Present Illness: Pt is an 79 year old female admitted from SNF for AMS with recurrent UTI and PMHx of L septic wrist arthritis, UTIs, Alzheimer's disease, dementia.       Prior Functioning  Home Living Family/patient expects to be discharged to:: Skilled nursing facility Additional Comments: Pt unable to state PLOF     Cognition  Cognition Arousal/Alertness: Awake/alert Behavior During Therapy: Restless (does not follow commands) Overall Cognitive Status: No family/caregiver present to determine baseline cognitive functioning    Extremity/Trunk Assessment Lower Extremity Assessment Lower Extremity Assessment: Generalized weakness Cervical / Trunk Assessment Cervical / Trunk Assessment: Normal   Balance Balance Balance Assessed: Yes Static Sitting Balance Static Sitting - Balance Support: No upper extremity supported;Feet supported Static Sitting - Level of Assistance: 5: Stand by assistance Static Standing Balance Static Standing - Balance  Support: Right upper extremity supported Static Standing - Level of Assistance: 4: Min assist  End of Session PT - End of  Session Patient left: in bed;with bed alarm set Nurse Communication: Mobility status  GP    Bayard Hugger. [place], [place] [phone] 08/19/2012, 4:00 PM

## 2012-08-19 NOTE — Progress Notes (Signed)
Pt pulled IV out.  RN informed pt that we would start another IV. Pt refused to have another IV started or for RN to look at her arm for a new site.  Pt very confused and cannot respond appropriately to questions, however, pt very firm in her refusal for new IV start.  On call provider paged and provider to change what she can over to oral.  Provider verbalized to not "fight" pt on IV.

## 2012-08-19 NOTE — Progress Notes (Signed)
Called because patient pulled out IV and adamantly refused a new IV.  Changed medications to oral.    Sensitivities were not back yet on E-coli UTI.  Last culture shows  Multi-resistance. Appears sensitive to Ceftin (which is what she was just discharged on). Allergy to Ceftin appears to be diarrhea.   Will try oral Ceftin once again - the alternative is rocephin IM.  Algis Downs, PA-C [facility] Pager: [phone]

## 2012-08-19 NOTE — Progress Notes (Addendum)
Thank you for consulting the Palliative Medicine Team at [facility] to meet your patient's and family's needs.   The reason that you asked [place] to see your patient is  For GOC  We have scheduled your patient for a meeting: 7/13 1pm  The Surrogate decision make is: Daughter Audiological scientist information:[phone]  Other family members that need to be present:     Your patient is able/unable to participate:  Additional Narrative:

## 2012-08-19 NOTE — Progress Notes (Signed)
[facility] PROGRESS NOTE  Kaitlyn Lucero ZOX:[id number] DOB: [birth year] DOA: 08/18/2012 PCP: Kaitlyn Alken, MD  Brief narrative: 79 y.o. female with history of atrial fibrillation, hypertension, Alzheimer's dementia, recurrent UTIs recent admission for UTI from 08/07/2012 to 08/10/2038 presented again to [name] ED from SNF with altered mental status, lethargy. In ED, urinalysis was consistent with a urinary tract infection. CMP revealed creatinine of 1.27 and a potassium of 5.3. Repeat potassium was 3.8. Lactic acid level was 2.18. The CBC revealed white blood cell count of 12.9 hemoglobin of 11.1 otherwise was within normal limits. Chest x-ray was negative for any acute infiltrate. EKG showed atrial fibrillation.   Assessment/Plan:  Principal Problem:   UTI (lower urinary tract infection) - on zosyn; will transition to PO on discharge, most likely keflex if this will be continued for 6-12 months per GU. Bactrim may cause significant renal impairment  And would need close follow up of renal function. - appreciate urology following. Active Problems:   Acute encephalopathy - likely due to UTI - no significant changes in mental status since admission   ALZHEIMERS DISEASE - stable   HYPERTENSION - continue Norvasc, imdur and metoprolol   PAROXYSMAL ATRIAL FIBRILLATION - continue aspirin - rate controlled with metoprolol   Hypothyroidism - continue synthroid   Acute renal failure - secondary to UTI - continue IV fluids and monitor renal function   Dehydration - secondary to UTI   Code Status: DNR/DNI Family Communication: spoke with the patient's daughter (cell# [phone]) Disposition Plan: to SNF when stable  Manson Passey, MD  [facility] Pager [phone]  If 7PM-7AM, please contact night-coverage [email or web address] Password [email or web address] 08/19/2012, 3:43 PM   LOS: 1 day   Consultants:  Urology (dr. Isabel Caprice)  Procedures:  None   Antibiotics:  Zosyn 08/18/2012  -->  HPI/Subjective: No acute overnight events.  Objective: Filed Vitals:   08/18/12 2020 08/19/12 0158 08/19/12 0515 08/19/12 1358  BP: 104/50 124/48 128/50 122/49  Pulse: 82 75 77 69  Temp: 100.2 F (37.9 C) 98.5 F (36.9 C) 98.2 F (36.8 C) 98.6 F (37 C)  TempSrc: Oral Oral Axillary Oral  Resp: 28 18 21 20   Height:      Weight:      SpO2: 96% 99% 99% 98%    Intake/Output Summary (Last 24 hours) at 08/19/12 1543 Last data filed at 08/19/12 1300  Gross per 24 hour  Intake    170 ml  Output      0 ml  Net    170 ml    Exam:   General:  Pt is alert, follows commands appropriately, not in acute distress  Cardiovascular: Regular rate and rhythm, S1/S2, no murmurs, no rubs, no gallops  Respiratory: Clear to auscultation bilaterally, no wheezing, no crackles, no rhonchi  Abdomen: Soft, non tender, non distended, bowel sounds present, no guarding  Extremities: No edema, pulses DP and PT palpable bilaterally  Neuro: Grossly nonfocal  Data Reviewed: Basic Metabolic Panel:  Recent Labs Lab 08/18/12 1355 08/18/12 1626 08/19/12 0415  NA 135  --  139  K 5.3* 3.8 3.2*  CL 104  --  108  CO2 19  --  19  GLUCOSE 114*  --  98  BUN 21  --  14  CREATININE 1.27*  --  1.03  CALCIUM 9.4  --  8.4  MG  --   --  1.8   Liver Function Tests:  Recent Labs Lab 08/18/12 1355  AST 36  ALT 18  ALKPHOS  60  BILITOT 0.5  PROT 7.1  ALBUMIN 3.5   No results found for this basename: LIPASE, AMYLASE,  in the last 168 hours No results found for this basename: AMMONIA,  in the last 168 hours CBC:  Recent Labs Lab 08/18/12 1500 08/19/12 0415  WBC 12.9* 8.4  NEUTROABS 10.2*  --   HGB 11.1* 10.0*  HCT 33.2* 29.0*  MCV 89.5 88.1  PLT 285 228   Cardiac Enzymes: No results found for this basename: CKTOTAL, CKMB, CKMBINDEX, TROPONINI,  in the last 168 hours BNP: No components found with this basename: POCBNP,  CBG:  Recent Labs Lab 08/18/12 1341  GLUCAP 85     Recent Results (from the past 240 hour(s))  CULTURE, BLOOD (ROUTINE X 2)     Status: None   Collection Time    08/18/12  2:05 PM      Result Value Range Status   Specimen Description BLOOD RIGHT ARM   Final   Special Requests BOTTLES DRAWN AEROBIC AND ANAEROBIC 5CC   Final   Culture  Setup Time 08/18/2012 22:19   Final   Culture     Final   Value:        BLOOD CULTURE RECEIVED NO GROWTH TO DATE CULTURE WILL BE HELD FOR 5 DAYS BEFORE ISSUING A FINAL NEGATIVE REPORT   Report Status PENDING   Incomplete  CULTURE, BLOOD (ROUTINE X 2)     Status: None   Collection Time    08/18/12  2:50 PM      Result Value Range Status   Specimen Description BLOOD RIGHT ARM   Final   Special Requests BOTTLES DRAWN AEROBIC AND ANAEROBIC 2CC   Final   Culture  Setup Time 08/18/2012 22:20   Final   Culture     Final   Value:        BLOOD CULTURE RECEIVED NO GROWTH TO DATE CULTURE WILL BE HELD FOR 5 DAYS BEFORE ISSUING A FINAL NEGATIVE REPORT   Report Status PENDING   Incomplete     Studies: Dg Chest 1 View  08/18/2012   *RADIOLOGY REPORT*  Clinical Data: Diarrhea, agitation  CHEST - 1 VIEW  Comparison: 08/07/2012  Findings: Cardiomediastinal silhouette is unremarkable.  No acute infiltrate or pleural effusion.  No pulmonary edema.  Stable calcified granuloma in the right lower lobe.  IMPRESSION: No active disease.   Original Report Authenticated By: Natasha Mead, M.D.   US Renal  08/18/2012   *RADIOLOGY REPORT*  Clinical Data: Recurrent urinary tract infections.  RENAL/URINARY TRACT ULTRASOUND COMPLETE  Comparison:  None.  Findings:  Right Kidney:  Normal, measuring 9.5 cm in length.  No hydronephrosis.  Left Kidney:  Normal, measuring 9.9 cm in length.  No hydronephrosis.  Bladder:  Normal.  IMPRESSION: Normal examination.   Original Report Authenticated By: Beckie Salts, M.D.    Scheduled Meds: . amLODipine  10 mg Oral Daily  . aspirin  81 mg Oral Daily  . enoxaparin (LOVENOX) injection  40 mg  Subcutaneous Q24H  . feeding supplement  237 mL Oral BID  . isosorbide mononitrate  15 mg Oral Daily  . levothyroxine  37.5 mcg Intravenous Daily  . metoprolol  5 mg Intravenous Once  . metoprolol  50 mg Oral BID  . piperacillin-tazobactam (ZOSYN)  IV  3.375 g Intravenous Q8H   Continuous Infusions: . sodium chloride 75 mL/hr (08/18/12 2031)

## 2012-08-20 DIAGNOSIS — N390 Urinary tract infection, site not specified: Principal | ICD-10-CM

## 2012-08-20 LAB — BASIC METABOLIC PANEL
Chloride: 104 mEq/L (ref 96–112)
Creatinine, Ser: 0.76 mg/dL (ref 0.50–1.10)
GFR calc Af Amer: 85 mL/min — ABNORMAL LOW (ref >90)
Potassium: 3.1 mEq/L — ABNORMAL LOW (ref 3.5–5.1)

## 2012-08-20 MED ORDER — POTASSIUM CHLORIDE CRYS ER 20 MEQ PO TBCR: 20.0000 meq | EXTENDED_RELEASE_TABLET | Freq: Every day | ORAL | Status: DC

## 2012-08-20 MED ORDER — LORAZEPAM 0.5 MG PO TABS: 0.5000 mg | ORAL_TABLET | Freq: Four times a day (QID) | ORAL | Status: AC | PRN

## 2012-08-20 MED ORDER — CEPHALEXIN 250 MG PO CAPS: 250.0000 mg | ORAL_CAPSULE | Freq: Two times a day (BID) | ORAL | Status: DC

## 2012-08-20 MED ORDER — POTASSIUM CHLORIDE CRYS ER 20 MEQ PO TBCR
40.0000 meq | EXTENDED_RELEASE_TABLET | Freq: Once | ORAL | Status: AC
  Administered 2012-08-20: 40 meq via ORAL
  Filled 2012-08-20: qty 2

## 2012-08-20 NOTE — Progress Notes (Signed)
Clinical Social Work Department BRIEF PSYCHOSOCIAL ASSESSMENT 08/20/2012  Patient:  Kaitlyn Lucero, Kaitlyn Lucero     Account Number:  [id number]     Admit date:  08/18/2012  Clinical Social Worker:  Doroteo Glassman  Date/Time:  08/20/2012 11:03 AM  Referred by:  Physician  Date Referred:  08/20/2012 Referred for  ALF Placement   Other Referral:   Interview type:  Other - See comment Other interview type:   Pt's daughter    PSYCHOSOCIAL DATA Living Status:  FACILITY Admitted from facility:  [facility] Level of care:  Assisted Living Primary support name:  Adalina Dopson Primary support relationship to patient:  CHILD, ADULT Degree of support available:   strong    CURRENT CONCERNS Current Concerns  Post-Acute Placement   Other Concerns:    SOCIAL WORK ASSESSMENT / PLAN Per MD, Pt ready for d/c.    Notified Pt's daughter.  Confirmed that Pt is from [facility] and that Pt is to return there.  Pt's daughter offered her assistance in getting Pt back to [facility], if need be.    Notified Laverne at facility.  Laverne requesting FL2.  CSW to complete FL2 and fax it to her.    CSW to continue to follow.   Assessment/plan status:  Psychosocial Support/Ongoing Assessment of Needs Other assessment/ plan:   Information/referral to community resources:   n/a    PATIENT'S/FAMILY'S RESPONSE TO PLAN OF CARE: Pt's daughter thankful to CSW for assistance in getting Pt back to [facility].   Providence Crosby, LCSWA Clinical Social Work [phone]

## 2012-08-20 NOTE — Progress Notes (Signed)
Pt did not take Ceftin tablet as scheduled at [facility].  Pill placed in applesauce as this has been the best way to get pt to take pills (per previous shift staff), but pt spit out pill and applesauce.  RN unable to get pt to take pill again.  Pt very confused and will not answer any questions appropriately.

## 2012-08-20 NOTE — Discharge Summary (Signed)
Physician Discharge Summary  Kaitlyn Lucero ZOX:[id number] DOB: 1921-03-14 DOA: 08/18/2012  PCP: [name], MD  Admit date: 08/18/2012 Discharge date: 08/20/2012  Recommendations for Outpatient Follow-up:  1. Please note that we have started Keflex 250 mg PO capsules twice a day for another 6 months for now. The reason why capsule was chosen over suspension is because of less frequency twice a day and with suspension would be 4 times a day increasing the chance of C.diff colitis. 2. Please follow up with urology per scheduled appointment 09/06/2012 with Dr. Brunilda Payor 3. Potassium has been slightly lower than norma range, 3.2 and 3.1 over past 2 days so I would recommend potassium supplement 20 meq daily for next 5 days and then stop. Please recheck potassium on daily basis until it normalizes. 4. Hemoglobin is stable on discharge at 10. Patient did not require transfusions during this hospital stay. 5. Kidney function is within normal limits at this time. Continue to monitor per nursing home protocol. 6. We will document in discharge summary for palliative care services to follow. 7. Use ativan with caution. It is prescribed for anxiety but it may cause confusion in elderly. Use it only as needed. 8. Potassium has been repleted prior to discharge, 40 meq one dose PO 9.Please have palliative care service follow the patient in ALF  Discharge Diagnoses:  Principal Problem:   UTI (lower urinary tract infection) Active Problems:   Acute encephalopathy   ALZHEIMERS DISEASE   HYPERTENSION   PAROXYSMAL ATRIAL FIBRILLATION   Hypothyroidism   AKI (acute kidney injury)   Dehydration   Leukocytosis, unspecified  Discharge Condition: medically stable for discharge to ALF today; I discussed the discharge plan with the patient's daughter and we did agree Keflex would be better option for her mother long term  Diet recommendation: as tolerated  History of present illness:  79 y.o. female  with history of atrial fibrillation, hypertension, Alzheimer's dementia, recurrent UTIs recent admission for UTI from 08/07/2012 to 08/10/2038 presented again to [facility] ED from SNF with altered mental status, lethargy.  In ED, urinalysis was consistent with a urinary tract infection. CMP revealed creatinine of 1.27 and a potassium of 5.3. Repeat potassium was 3.8. Lactic acid level was 2.18. The CBC revealed white blood cell count of 12.9 hemoglobin of 11.1 otherwise was within normal limits. Chest x-ray was negative for any acute infiltrate. EKG showed atrial fibrillation.   Assessment/Plan:   Principal Problem:  UTI (lower urinary tract infection)  - on zosyn; will transition to PO Keflex 250 mg PO BID for 6 months on discharge. This plan was discussed with the patient's daughter at bedside. Bactrim causes renal insufficieny and in long term treatment may not be a good choice for this elderly patient. - appreciate urology following.  Active Problems:  Acute encephalopathy  - likely due to UTI  - looks better today ALZHEIMERS DISEASE  - stable  HYPERTENSION  - continue Norvasc, imdur and metoprolol  PAROXYSMAL ATRIAL FIBRILLATION  - continue aspirin  - rate controlled with metoprolol  Hypothyroidism  - continue synthroid  Acute renal failure  - secondary to UTI  - continue IV fluids and monitor renal function  Dehydration  - secondary to UTI  Anxiety and depression - use ativan with caution, per daughter wishes she wanted ativan as needed only. I have explained to her the side effect of confusion and she understood that but also mentioned that in ALF it helps sometimes with agitation and restlessness. - continue paxil  Code Status: DNR/DNI  Family Communication: spoke with the patient's daughter at the bedside (cell# [phone])  Disposition Plan: to ALF today  Manson Passey, MD  [facility]  Pager [phone]   Consultants:  Urology (dr. Isabel Caprice) Procedures:  None  Antibiotics:  Zosyn  08/18/2012 --> 08/20/2012 Keflex 250 g PO BID capsules for 6 months for now, low dose suppressive Abx therapy for UTI  Discharge Exam: Filed Vitals:   08/20/12 0544  BP: 157/59  Pulse: 68  Temp: 97.6 F (36.4 C)  Resp: 20   Filed Vitals:   08/19/12 0515 08/19/12 1358 08/19/12 2015 08/20/12 0544  BP: 128/50 122/49 170/72 157/59  Pulse: 77 69 83 68  Temp: 98.2 F (36.8 C) 98.6 F (37 C) 97.8 F (36.6 C) 97.6 F (36.4 C)  TempSrc: Axillary Oral Axillary Axillary  Resp: 21 20 19 20   Height:      Weight:      SpO2: 99% 98% 99% 100%    General: Pt is alert, follows commands appropriately, not in acute distress Cardiovascular: Regular rate and rhythm, S1/S2 +, no murmurs, no rubs, no gallops Respiratory: Clear to auscultation bilaterally, no wheezing, no crackles, no rhonchi Abdominal: Soft, non tender, non distended, bowel sounds +, no guarding Extremities: no edema, no cyanosis, pulses palpable bilaterally DP and PT Neuro: Grossly nonfocal  Discharge Instructions  Discharge Orders   Future Orders Complete By Expires     Call MD for:  difficulty breathing, headache or visual disturbances  As directed     Call MD for:  persistant dizziness or light-headedness  As directed     Call MD for:  persistant nausea and vomiting  As directed     Call MD for:  severe uncontrolled pain  As directed     Diet - low sodium heart healthy  As directed     Discharge instructions  As directed     Comments:      1. Please note that we have started Keflex 250 mg PO capsules twice a day for another 6 months for now. The reason why capsule was chosen over suspension is because of less frequency twice a day and with suspension would be 4 times a day increasing the chance of C.diff colitis. 2. Please follow up with urology per scheduled appointment 09/06/2012 with Dr. Brunilda Payor 3. Potassium has been slightly lower than norma range, 3.2 and 3.1 over past 2 days so I would recommend potassium supplement 20  meq daily for next 5 days and then stop. Please recheck potassium on daily basis until it normalizes. 4. Hemoglobin is stable on discharge at 10. Patient did not require transfusions during this hospital stay. 5. Kidney function is within normal limits at this time. Continue to monitor per nursing home protocol. 6. We will document in discharge summary for palliative care services to follow. 7. Use ativan with caution. It is prescribed for anxiety but it may cause confusion in elderly. Use it only as needed.    Increase activity slowly  As directed         Medication List         acetaminophen 325 MG tablet  Commonly known as:  TYLENOL  Take 2 tablets (650 mg total) by mouth every 6 (six) hours as needed.     amLODipine 10 MG tablet  Commonly known as:  NORVASC  Take 1 tablet (10 mg total) by mouth daily.     aspirin 81 MG chewable tablet  Chew 81 mg  by mouth daily.     benazepril 40 MG tablet  Commonly known as:  LOTENSIN  Take 40 mg by mouth daily.     bisacodyl 10 MG suppository  Commonly known as:  DULCOLAX  Place 1 suppository (10 mg total) rectally as needed for constipation.     cephALEXin 250 MG capsule  Commonly known as:  KEFLEX  Take 1 capsule (250 mg total) by mouth 2 (two) times daily.     feeding supplement Liqd  Take 237 mLs by mouth 2 (two) times daily. Vanilla flavor     isosorbide mononitrate 30 MG 24 hr tablet  Commonly known as:  IMDUR  Take 15 mg by mouth daily.     levothyroxine 75 MCG tablet  Commonly known as:  SYNTHROID, LEVOTHROID  Take 75 mcg by mouth daily before breakfast.     LORazepam 0.5 MG tablet  Commonly known as:  ATIVAN  Take 1 tablet (0.5 mg total) by mouth every 6 (six) hours as needed for anxiety.     metoprolol 50 MG tablet  Commonly known as:  LOPRESSOR  Take 50 mg by mouth 2 (two) times daily.     ondansetron 4 MG disintegrating tablet  Commonly known as:  ZOFRAN ODT  Take 1 tablet (4 mg total) by mouth every 8 (eight)  hours as needed for nausea.     traMADol 50 MG tablet  Commonly known as:  ULTRAM  Take 1 tablet (50 mg total) by mouth every 8 (eight) hours as needed for pain.           Follow-up Information   Follow up with [name], MD. Call in 1 week.   Contact information:   [address] [place] [address] [phone]       Follow up with NESI,MARC-HENRY, MD On 09/06/2012.   Contact information:   [address] [place] [address] [phone]        The results of significant diagnostics from this hospitalization (including imaging, microbiology, ancillary and laboratory) are listed below for reference.    Significant Diagnostic Studies: Dg Chest 1 View  08/18/2012   *RADIOLOGY REPORT*  Clinical Data: Diarrhea, agitation  CHEST - 1 VIEW  Comparison: 08/07/2012  Findings: Cardiomediastinal silhouette is unremarkable.  No acute infiltrate or pleural effusion.  No pulmonary edema.  Stable calcified granuloma in the right lower lobe.  IMPRESSION: No active disease.   Original Report Authenticated By: Natasha Mead, M.D.   Ct Head Wo Contrast  08/07/2012   *RADIOLOGY REPORT*  Clinical Data: Severe headache.  Altered mental status  CT HEAD WITHOUT CONTRAST  Technique:  Contiguous axial images were obtained from the base of the skull through the vertex without contrast.  Comparison: CT 04/07/2012  Findings: Image quality degraded by significant motion.  Multiple images were repeated due to motion.  Moderate atrophy.  Chronic microvascular ischemic change in the white matter.  Chronic lacunar infarction in the left lateral basal ganglia is unchanged.  Allowing for motion, no acute infarct hemorrhage or mass lesion identified.  No focal skull lesion.  IMPRESSION: Image quality degraded by motion.  No acute abnormality is identified.   Original Report Authenticated By: Janeece Riggers, M.D.   US Renal  08/18/2012   *RADIOLOGY REPORT*   Clinical Data: Recurrent urinary tract infections.  RENAL/URINARY TRACT  ULTRASOUND COMPLETE  Comparison:  None.  Findings:  Right Kidney:  Normal, measuring 9.5 cm in length.  No hydronephrosis.  Left Kidney:  Normal, measuring 9.9 cm in length.  No hydronephrosis.  Bladder:  Normal.  IMPRESSION: Normal examination.   Original Report Authenticated By: Beckie Salts, M.D.   Dg Chest Port 1 View  08/07/2012   *RADIOLOGY REPORT*  Clinical Data: 79 year old female shortness of breath weakness confusion.  PORTABLE CHEST - 1 VIEW  Comparison: 06/21/2012 and earlier.  Findings: AP portable semi upright view at 1355 hours.  Stable lung volumes.  Stable cardiac size and mediastinal contours. Breast attenuation artifact projecting over the central lower mediastinum. No pneumothorax or pulmonary edema.  No pleural effusion or consolidation.  IMPRESSION: Breast attenuation artifact felt responsible for the increased density over the lower mediastinum. No acute cardiopulmonary abnormality identified.   Original Report Authenticated By: Erskine Speed, M.D.    Microbiology: Recent Results (from the past 240 hour(s))  CULTURE, BLOOD (ROUTINE X 2)     Status: None   Collection Time    08/18/12  2:05 PM      Result Value Range Status   Specimen Description BLOOD RIGHT ARM   Final   Special Requests BOTTLES DRAWN AEROBIC AND ANAEROBIC 5CC   Final   Culture  Setup Time 08/18/2012 22:19   Final   Culture     Final   Value:        BLOOD CULTURE RECEIVED NO GROWTH TO DATE CULTURE WILL BE HELD FOR 5 DAYS BEFORE ISSUING A FINAL NEGATIVE REPORT   Report Status PENDING   Incomplete  CULTURE, BLOOD (ROUTINE X 2)     Status: None   Collection Time    08/18/12  2:50 PM      Result Value Range Status   Specimen Description BLOOD RIGHT ARM   Final   Special Requests BOTTLES DRAWN AEROBIC AND ANAEROBIC 2CC   Final   Culture  Setup Time 08/18/2012 22:20   Final   Culture     Final   Value:        BLOOD CULTURE RECEIVED NO  GROWTH TO DATE CULTURE WILL BE HELD FOR 5 DAYS BEFORE ISSUING A FINAL NEGATIVE REPORT   Report Status PENDING   Incomplete  URINE CULTURE     Status: None   Collection Time    08/18/12  3:34 PM      Result Value Range Status   Specimen Description URINE, CATHETERIZED   Final   Special Requests NONE   Final   Culture  Setup Time 08/18/2012 23:26   Final   Colony Count >=100,000 COLONIES/ML   Final   Culture ESCHERICHIA COLI   Final   Report Status PENDING   Incomplete     Labs: Basic Metabolic Panel:  Recent Labs Lab 08/18/12 1355 08/18/12 1626 08/19/12 0415 08/20/12 0747  NA 135  --  139 137  K 5.3* 3.8 3.2* 3.1*  CL 104  --  108 104  CO2 19  --  19 20  GLUCOSE 114*  --  98 97  BUN 21  --  14 6  CREATININE 1.27*  --  1.03 0.76  CALCIUM 9.4  --  8.4 9.2  MG  --   --  1.8  --    Liver Function Tests:  Recent Labs Lab 08/18/12 1355  AST 36  ALT 18  ALKPHOS 60  BILITOT 0.5  PROT 7.1  ALBUMIN 3.5   No  results found for this basename: LIPASE, AMYLASE,  in the last 168 hours No results found for this basename: AMMONIA,  in the last 168 hours CBC:  Recent Labs Lab 08/18/12 1500 08/19/12 0415  WBC 12.9* 8.4  NEUTROABS 10.2*  --   HGB 11.1* 10.0*  HCT 33.2* 29.0*  MCV 89.5 88.1  PLT 285 228   Cardiac Enzymes: No results found for this basename: CKTOTAL, CKMB, CKMBINDEX, TROPONINI,  in the last 168 hours BNP: BNP (last 3 results) No results found for this basename: PROBNP,  in the last 8760 hours CBG:  Recent Labs Lab 08/18/12 1341  GLUCAP 85    Time coordinating discharge: Over 30 minutes  Signed:  Manson Passey, MD  [facility]  08/20/2012, 9:54 AM  Pager #: [phone]

## 2012-08-20 NOTE — Progress Notes (Signed)
Sent FL2 and d/c summary to facility.  Per Kathlene November, facility ready to receive Pt.  Notified daughter.  Arranged for transportation.  Pt to be d/c'd.  Providence Crosby, LCSWA Clinical Social Work [phone]

## 2012-08-20 NOTE — Progress Notes (Signed)
Patient Kaitlyn Lucero      DOB: May 19, 1921      UJW:[id number]  Received call from patient's daughter.  Patient cleared for return to [facility].  Kaitlyn Lucero wanted to know if Palliative Care meeting had to be in the hospital.  I answered no.  I met Kaitlyn Lucero at her office at [facility] and provided her with an [employer] brochure on palliative care services in long term care , a MOST form and a [facility] booklet.  I have made myself available at any time after discharge to answer questions as they enter this part of their moms care.  Meryem Haertel L. Ladona Ridgel, MD MBA The Palliative Medicine Team at [facility] Phone: [phone] Pager: [phone]

## 2012-08-20 NOTE — Progress Notes (Signed)
Patient discharged to [facility] via ambulance, discharge instructions sent to facility. Patients skin WDI at discharge.

## 2012-08-20 NOTE — Progress Notes (Signed)
Per MD, Pt ready for d/c.  Left a message for Pt's daughter on her home and cell phones.  Providence Crosby, LCSWA Clinical Social Work [phone]

## 2012-08-22 LAB — URINE CULTURE

## 2012-08-24 LAB — CULTURE, BLOOD (ROUTINE X 2): Culture: NO GROWTH

## 2012-09-15 ENCOUNTER — Emergency Department (HOSPITAL_COMMUNITY)
Admission: EM | Admit: 2012-09-15 | Discharge: 2012-09-15 | Disposition: A | Discharge status: Home / Self Care | Payer: Medicare Other | Attending: Emergency Medicine | Admitting: Emergency Medicine

## 2012-09-15 ENCOUNTER — Emergency Department (HOSPITAL_COMMUNITY): Payer: Medicare Other

## 2012-09-15 DIAGNOSIS — I4891 Unspecified atrial fibrillation: Secondary | ICD-10-CM | POA: Insufficient documentation

## 2012-09-15 DIAGNOSIS — Z79899 Other long term (current) drug therapy: Secondary | ICD-10-CM | POA: Insufficient documentation

## 2012-09-15 DIAGNOSIS — Z90710 Acquired absence of both cervix and uterus: Secondary | ICD-10-CM | POA: Insufficient documentation

## 2012-09-15 DIAGNOSIS — Z880 Allergy status to penicillin: Secondary | ICD-10-CM | POA: Insufficient documentation

## 2012-09-15 DIAGNOSIS — F0280 Dementia in other diseases classified elsewhere without behavioral disturbance: Secondary | ICD-10-CM | POA: Insufficient documentation

## 2012-09-15 DIAGNOSIS — R630 Anorexia: Secondary | ICD-10-CM | POA: Insufficient documentation

## 2012-09-15 DIAGNOSIS — Z7982 Long term (current) use of aspirin: Secondary | ICD-10-CM | POA: Insufficient documentation

## 2012-09-15 DIAGNOSIS — N390 Urinary tract infection, site not specified: Secondary | ICD-10-CM | POA: Insufficient documentation

## 2012-09-15 DIAGNOSIS — Z8739 Personal history of other diseases of the musculoskeletal system and connective tissue: Secondary | ICD-10-CM | POA: Insufficient documentation

## 2012-09-15 DIAGNOSIS — Z9889 Other specified postprocedural states: Secondary | ICD-10-CM | POA: Insufficient documentation

## 2012-09-15 DIAGNOSIS — G309 Alzheimer's disease, unspecified: Secondary | ICD-10-CM | POA: Insufficient documentation

## 2012-09-15 DIAGNOSIS — I10 Essential (primary) hypertension: Secondary | ICD-10-CM | POA: Insufficient documentation

## 2012-09-15 DIAGNOSIS — R4182 Altered mental status, unspecified: Secondary | ICD-10-CM | POA: Insufficient documentation

## 2012-09-15 LAB — CBC
HCT: 31.2 % — ABNORMAL LOW (ref 36.0–46.0)
Hemoglobin: 10.4 g/dL — ABNORMAL LOW (ref 12.0–15.0)
MCH: 30.5 pg (ref 26.0–34.0)
MCHC: 33.3 g/dL (ref 30.0–36.0)
MCV: 91.5 fL (ref 78.0–100.0)
RBC: 3.41 MIL/uL — ABNORMAL LOW (ref 3.87–5.11)

## 2012-09-15 LAB — BASIC METABOLIC PANEL
BUN: 29 mg/dL — ABNORMAL HIGH (ref 6–23)
CO2: 22 mEq/L (ref 19–32)
Calcium: 9.3 mg/dL (ref 8.4–10.5)
Creatinine, Ser: 1.45 mg/dL — ABNORMAL HIGH (ref 0.50–1.10)
GFR calc non Af Amer: 31 mL/min — ABNORMAL LOW (ref >90)
Glucose, Bld: 127 mg/dL — ABNORMAL HIGH (ref 70–99)
Sodium: 140 mEq/L (ref 135–145)

## 2012-09-15 LAB — URINALYSIS, ROUTINE W REFLEX MICROSCOPIC
Glucose, UA: NEGATIVE mg/dL
Hgb urine dipstick: NEGATIVE
Protein, ur: NEGATIVE mg/dL
Urobilinogen, UA: 0.2 mg/dL (ref 0.0–1.0)

## 2012-09-15 LAB — TROPONIN I: Troponin I: <0.3 ng/mL (ref <0.30)

## 2012-09-15 LAB — URINE MICROSCOPIC-ADD ON

## 2012-09-15 MED ORDER — SODIUM CHLORIDE 0.9 % IV BOLUS (SEPSIS)
500.0000 mL | Freq: Once | INTRAVENOUS | Status: AC
  Administered 2012-09-15: 500 mL via INTRAVENOUS

## 2012-09-15 MED ORDER — CIPROFLOXACIN HCL 500 MG PO TABS: 500.0000 mg | ORAL_TABLET | Freq: Two times a day (BID) | ORAL | Status: AC

## 2012-09-15 NOTE — ED Notes (Signed)
Patient transported to CT at this time. 

## 2012-09-15 NOTE — ED Notes (Signed)
Spoke with daughter of patient, Kaitlyn Lucero who requested update. Advised of neuro status, she is agreeable that this is patient's usual neuro status.  Advised doctor was in room and would call her after work up was completed.  [phone].

## 2012-09-15 NOTE — ED Notes (Signed)
Spoke with [facility] @ [facility] Alzheimer's unit.  She reports that the pt was eating her lunch at 1215 and was witnessed to have slumped to her L side and had a period of apnea that lasted 5-6 secs.  The pt was rushed to her room but returned to her baseline LOC after 1-2 mins.  Lawson Fiscal reports that the pt has substantial alzheimer's and has garbled speech as her baseline.  Pt is reportedly ambulatory without difficulty.

## 2012-09-15 NOTE — ED Notes (Signed)
Awaiting CT, called and they have her in queue at this time.

## 2012-09-15 NOTE — ED Notes (Signed)
Report given to Destiny at [facility] at this time.  Called and left message for Daughter, [name] at cell phone given. Advised patient will [facility] dc [facility] and can call back if needs further information.

## 2012-09-15 NOTE — ED Notes (Signed)
Patient is resting comfortably. 

## 2012-09-15 NOTE — ED Notes (Signed)
Per report from [facility] pt is a resident on the Alzheimer's unit at [facility].  She reportedly was found slumped over at lunch table.  She was taken to her room by staff and 911 was called.   On EMS arrival pt had a CBG of 118.  Pt is interactive unknown what her normal LOC is at the present.

## 2012-09-15 NOTE — ED Provider Notes (Signed)
CSN: [id number]     Arrival date & time 09/15/12  1319 History     First MD Initiated Contact with Patient 09/15/12 1321     Chief Complaint  Patient presents with  . Loss of Consciousness   (Consider location/radiation/quality/duration/timing/severity/associated sxs/prior Treatment) HPI 79 y o w f with PMH of Advanced Alzh, HTN, paroxysmal A.fib, multiple recurrent UTI, was placed on keflex on admission last month to be taken for . Presented with loss of consciousness while eating lunch- which is her normal puree diet, pt suddenly slumped to the left, and was apneic for about 6 s.  She is in the memory impaired unit at [facility]. Pt was brought here by EMS,hx was obtained from the facility at this number- [phone], No episodes of choking or coughing prior to LOC, px speech is usually gabled, no reported facial drop, no preferential movement of any extremities. She would usu moan if in pain and hold affected area,so caretakers did not get a sense of chest pain, or headaches, or abdominal pain. Also Px's appetite in the past 3 days has been reduced. No diarrhoea or vomiting, no fever.   Past Medical History  Diagnosis Date  . Atrial fibrillation   . Unspecified essential hypertension   . Alzheimer disease   . UTI (urinary tract infection)   . Septic arthritis of wrist, left 06/2012   Past Surgical History  Procedure Laterality Date  . Bladder suspension  1953  . Cesarean section  1961  . Abdominal hysterectomy  1964  . Bladder surgery  1977  . Cataract extraction  2003    left eye  . Irrigation and debridement abscess Left 06/21/2012    Procedure: irrigation and debridement left wrist;  Surgeon: Johnette Abraham, MD;  Location: [facility];  Service: Plastics;  Laterality: Left;  left wrist   Family History  Problem Relation Age of Onset  . Other Mother     lung and heart problems  . Other Father     pace maker, heart problems  . Hypertension Brother   . Other Brother    CABG x3  . Hypertension Sister    History  Substance Use Topics  . Smoking status: Never Smoker   . Smokeless tobacco: Never Used  . Alcohol Use: No   OB History   Grav Para Term Preterm Abortions TAB SAB Ect Mult Living                 Review of Systems No pertinent findings on ROS.  Allergies  Amoxicillin-pot clavulanate; Macrobid; Nitrofurantoin; Banana flavor; Codeine; and Lidocaine  Home Medications   Current Outpatient Rx  Name  Route  Sig  Dispense  Refill  . acetaminophen (TYLENOL) 325 MG tablet   Oral   Take 2 tablets (650 mg total) by mouth every 6 (six) hours as needed.         Marland Kitchen amLODipine (NORVASC) 10 MG tablet   Oral   Take 1 tablet (10 mg total) by mouth daily.   30 tablet   0   . aspirin 81 MG chewable tablet   Oral   Chew 81 mg by mouth daily.         . benazepril (LOTENSIN) 40 MG tablet   Oral   Take 40 mg by mouth daily.         . bisacodyl (DULCOLAX) 10 MG suppository   Rectal   Place 1 suppository (10 mg total) rectally as needed for constipation.  12 suppository   0   . cephALEXin (KEFLEX) 250 MG capsule   Oral   Take 1 capsule (250 mg total) by mouth 2 (two) times daily.   60 capsule   5   . feeding supplement (ENSURE IMMUNE HEALTH) LIQD   Oral   Take 237 mLs by mouth 2 (two) times daily. Vanilla flavor          . isosorbide mononitrate (IMDUR) 30 MG 24 hr tablet   Oral   Take 15 mg by mouth daily.         Marland Kitchen levothyroxine (SYNTHROID, LEVOTHROID) 75 MCG tablet   Oral   Take 75 mcg by mouth daily before breakfast.         . LORazepam (ATIVAN) 0.5 MG tablet   Oral   Take 1 tablet (0.5 mg total) by mouth every 6 (six) hours as needed for anxiety.   30 tablet   0   . metoprolol (LOPRESSOR) 50 MG tablet   Oral   Take 50 mg by mouth 2 (two) times daily.         . ondansetron (ZOFRAN ODT) 4 MG disintegrating tablet   Oral   Take 1 tablet (4 mg total) by mouth every 8 (eight) hours as needed for nausea.   20  tablet   0   . traMADol (ULTRAM) 50 MG tablet   Oral   Take 1 tablet (50 mg total) by mouth every 8 (eight) hours as needed for pain.   30 tablet   0   . ciprofloxacin (CIPRO) 500 MG tablet   Oral   Take 1 tablet (500 mg total) by mouth 2 (two) times daily. One po bid x 7 days   14 tablet   0    BP 117/94  Pulse 62  Temp(Src) 96.4 F (35.8 C) (Rectal)  Resp 13  SpO2 97% Physical Exam Gen- Appears conscious but drowsy, does not appear to be in any distress, no response to questions. HEENT- Normocephalic, atraumatic, Eyes- ~98mm, equal, poorly reactive to light. Cardiac- RRR, no murmurs, rubs or gallops. Resp- Clear to auscultation bilat. Abd- soft, non tender to palpation, bowel sounds present. Neuro- exam limited by pt's mental status, not oriented to TPP, opens eyes in response to sound and touch, no facial droop, moves all extremities, DTRs present in all extremities.  ED Course   Procedures (including critical care time)  Labs Reviewed  CBC - Abnormal; Notable for the following:    RBC 3.41 (*)    Hemoglobin 10.4 (*)    HCT 31.2 (*)    All other components within normal limits  BASIC METABOLIC PANEL - Abnormal; Notable for the following:    Glucose, Bld 127 (*)    BUN 29 (*)    Creatinine, Ser 1.45 (*)    GFR calc non Af Amer 31 (*)    GFR calc Af Amer 36 (*)    All other components within normal limits  URINALYSIS, ROUTINE W REFLEX MICROSCOPIC - Abnormal; Notable for the following:    APPearance HAZY (*)    Bilirubin Urine SMALL (*)    Ketones, ur 15 (*)    Nitrite POSITIVE (*)    Leukocytes, UA SMALL (*)    All other components within normal limits  URINE MICROSCOPIC-ADD ON - Abnormal; Notable for the following:    Squamous Epithelial / LPF FEW (*)    Bacteria, UA MANY (*)    All other components within normal limits  URINE  CULTURE  TROPONIN I   Ct Head Wo Contrast  09/15/2012   *RADIOLOGY REPORT*  Clinical Data: 79 year old female with acute onset  mental status change, apnea.  CT HEAD WITHOUT CONTRAST  Technique:  Contiguous axial images were obtained from the base of the skull through the vertex without contrast.  Comparison: 08/07/2012 and earlier.  Findings: Stable paranasal sinuses and mastoids. No acute osseous abnormality identified.  No acute orbit or scalp soft tissue finding.  Calcified atherosclerosis at the skull base.  Study is mildly degraded by motion artifact despite repeated imaging attempts.  New since 08/07/2012 is confluent right periventricular white matter hypodensity in the right parietal lobe (series 2 image 18). No definite mass effect or associated hemorrhage.  Other cerebral white matter hypodensity appears stable.  No cortical changes identified in the right hemisphere to suggest acute cortically based infarct.  No intracranial mass lesion. No acute intracranial hemorrhage identified.  No suspicious intracranial vascular hyperdensity.  IMPRESSION: 1.  Progression of right hemisphere white matter disease since 08/07/2012.  This could be ischemic, but no changes of acute cortically based infarct are identified. 2.  No acute intracranial hemorrhage or mass effect.   Original Report Authenticated By: Erskine Speed, M.D.   Loss of consciousness.  MDM   Loss of consciousness- Pt with advanced dementia, LOC Could be cardiac in origin- as pt has paroxysmal A.fib, also [place] could be the cause, also consider Intracranial hrr/ishcemia. - EKG, cardiac monitor- Rate- 49, RRR, no disenable P waves, junctional rhythm, narrow QRS < 0.12s. - Troponins- Negative. - Head CT Wo contrast- Impression- Progression of right hemisphere white matter disease since 08/07/2012.  This could be ischemic, but no changes of acute cortically based infarct are identified. 2.  No acute intracranial hemorrhage or mass effect.   - BMP-Elevated Cr and BUN, likely pre-renal-BUN:Cr ratio- 20. Give bolus IVF n/s. - Iv N/s bolus. - CBC- No concerning  abnormalities.  - UA- Positive for nitrites and WBC, pt has a hx of multiple UTI, was placed on keflex last month for 6 months. Stop keflex, Start Ciprofloxacin- 500mg  BID X 7 days. - D/c Home.    Kennis Carina, MD 09/15/12 2117

## 2012-09-16 NOTE — ED Provider Notes (Signed)
I saw and evaluated the patient, reviewed the resident's note and I agree with the findings and plan. Patient with pmh dementia sent from an ecf for eval of an unresponsive episode.  She witnessed to have gone unresponsive for several seconds, then became apneic.  She woke up moments later and seemed more confused than normal.  She was then sent by ems for evaluation.  She adds little to history due to dementia but denies to me she is any discomfort.  Per daughter, patient is DNR/DNI.  On exam, the patient is afebrile and the vitals are stable.  The heart is regular rate and rhythm and the lungs are clear.  The abdomen is benign.  Neurologic exam is limited due to dementia, but she moves all four extremities and follows commands appropriately.  She is not oriented to place, time, or situation.  The workup reveals a persistent uti despite treatment with keflex.  Will change this to cipro.  No evidence for acute cva on head ct and labs are otherwise unremarkable.  She was observed in the ED for several hours and seemed comfortable, and had no further episodes.  I believe her to be stable for discharge, to return prn.   Date: 09/16/2012  Rate: 49  Rhythm: sinus bradycardia  QRS Axis: normal  Intervals: normal  ST/T Wave abnormalities: normal  Conduction Disutrbances:none  Narrative Interpretation:   Old EKG Reviewed: unchanged    Geoffery Lyons, MD 09/16/12 1207

## 2012-09-20 LAB — URINE CULTURE: Colony Count: >=100000

## 2012-09-21 ENCOUNTER — Telehealth (HOSPITAL_COMMUNITY): Payer: Self-pay | Admitting: *Deleted

## 2012-09-21 NOTE — ED Notes (Signed)
Post ED Visit - Positive Culture Follow-up: Successful Patient Follow-Up  Culture assessed and recommendations reviewed by: []  Wes Dulaney, Pharm.D., BCPS [x]  Celedonio Miyamoto, Pharm.D., BCPS []  Georgina Pillion, Pharm.D., BCPS []  [place], [address].D., BCPS, AAHIVP []  Estella Husk, Pharm.D., BCPS, AAHIVP  Positive Urine culture  []  Patient discharged without antimicrobial prescription and treatment is now indicated [x]  Organism is resistant to prescribed ED discharge antimicrobial []  Patient with positive blood cultures  Changes discussed with ED provider: Victorino Dike Pippenbrink New antibiotic prescription Bactrim Single Strength tablets BID x 7 days ,Stop Cipro  Larena Sox 09/21/2012, 4:07 PM

## 2012-09-23 ENCOUNTER — Emergency Department (HOSPITAL_COMMUNITY): Payer: Medicare Other

## 2012-09-23 ENCOUNTER — Emergency Department (HOSPITAL_COMMUNITY)
Admission: EM | Admit: 2012-09-23 | Discharge: 2012-09-23 | Disposition: A | Discharge status: Skilled Nursing Facility | Payer: Medicare Other | Attending: Emergency Medicine | Admitting: Emergency Medicine

## 2012-09-23 ENCOUNTER — Encounter (HOSPITAL_COMMUNITY): Payer: Self-pay

## 2012-09-23 DIAGNOSIS — F0280 Dementia in other diseases classified elsewhere without behavioral disturbance: Secondary | ICD-10-CM | POA: Insufficient documentation

## 2012-09-23 DIAGNOSIS — G309 Alzheimer's disease, unspecified: Secondary | ICD-10-CM | POA: Insufficient documentation

## 2012-09-23 DIAGNOSIS — Z87440 Personal history of urinary (tract) infections: Secondary | ICD-10-CM | POA: Insufficient documentation

## 2012-09-23 DIAGNOSIS — S79919A Unspecified injury of unspecified hip, initial encounter: Secondary | ICD-10-CM | POA: Insufficient documentation

## 2012-09-23 DIAGNOSIS — S79929A Unspecified injury of unspecified thigh, initial encounter: Secondary | ICD-10-CM | POA: Insufficient documentation

## 2012-09-23 DIAGNOSIS — I10 Essential (primary) hypertension: Secondary | ICD-10-CM | POA: Insufficient documentation

## 2012-09-23 DIAGNOSIS — Z880 Allergy status to penicillin: Secondary | ICD-10-CM | POA: Insufficient documentation

## 2012-09-23 DIAGNOSIS — Y9210 Unspecified residential institution as the place of occurrence of the external cause: Secondary | ICD-10-CM | POA: Insufficient documentation

## 2012-09-23 DIAGNOSIS — R296 Repeated falls: Secondary | ICD-10-CM | POA: Insufficient documentation

## 2012-09-23 DIAGNOSIS — Y939 Activity, unspecified: Secondary | ICD-10-CM | POA: Insufficient documentation

## 2012-09-23 DIAGNOSIS — M009 Pyogenic arthritis, unspecified: Secondary | ICD-10-CM | POA: Insufficient documentation

## 2012-09-23 DIAGNOSIS — W19XXXA Unspecified fall, initial encounter: Secondary | ICD-10-CM

## 2012-09-23 DIAGNOSIS — Z7982 Long term (current) use of aspirin: Secondary | ICD-10-CM | POA: Insufficient documentation

## 2012-09-23 DIAGNOSIS — Z79899 Other long term (current) drug therapy: Secondary | ICD-10-CM | POA: Insufficient documentation

## 2012-09-23 DIAGNOSIS — I4891 Unspecified atrial fibrillation: Secondary | ICD-10-CM | POA: Insufficient documentation

## 2012-09-23 NOTE — ED Notes (Signed)
Transport notified to return pt to [facility]

## 2012-09-23 NOTE — ED Notes (Addendum)
Pt daughter [name] called and wants [place] to call her back with update [phone]

## 2012-09-23 NOTE — ED Notes (Signed)
MD ok removal of KED

## 2012-09-23 NOTE — ED Provider Notes (Signed)
CSN: [id number]     Arrival date & time 09/23/12  9147 History     First MD Initiated Contact with Patient 09/23/12 [phone]     Chief Complaint  Patient presents with  . Fall   (Consider location/radiation/quality/duration/timing/severity/associated sxs/prior Treatment) HPI A LEVEL 5 CAVEAT PERTAINS DUE TO DEMENTIA.  Pt presents after reportedly mechanical fall at her living facility.  Pt c/o pain in right hip.    Past Medical History  Diagnosis Date  . Atrial fibrillation   . Unspecified essential hypertension   . Alzheimer disease   . UTI (urinary tract infection)   . Septic arthritis of wrist, left 06/2012   Past Surgical History  Procedure Laterality Date  . Bladder suspension  1953  . Cesarean section  1961  . Abdominal hysterectomy  1964  . Bladder surgery  1977  . Cataract extraction  2003    left eye  . Irrigation and debridement abscess Left 06/21/2012    Procedure: irrigation and debridement left wrist;  Surgeon: Johnette Abraham, MD;  Location: [facility];  Service: Plastics;  Laterality: Left;  left wrist   Family History  Problem Relation Age of Onset  . Other Mother     lung and heart problems  . Other Father     pace maker, heart problems  . Hypertension Brother   . Other Brother     CABG x3  . Hypertension Sister    History  Substance Use Topics  . Smoking status: Never Smoker   . Smokeless tobacco: Never Used  . Alcohol Use: No   OB History   Grav Para Term Preterm Abortions TAB SAB Ect Mult Living                 Review of Systems UNABLE TO OBTAIN ROS DUE TO LEVEL 5 CAVEAT  Allergies  Amoxicillin-pot clavulanate; Macrobid; Nitrofurantoin; Banana flavor; Codeine; and Lidocaine  Home Medications   Current Outpatient Rx  Name  Route  Sig  Dispense  Refill  . acetaminophen (TYLENOL) 325 MG tablet   Oral   Take 650 mg by mouth every 6 (six) hours as needed for pain.         Marland Kitchen amLODipine (NORVASC) 10 MG tablet   Oral   Take 1 tablet (10 mg  total) by mouth daily.   30 tablet   0   . aspirin 81 MG chewable tablet   Oral   Chew 81 mg by mouth daily.         . benazepril (LOTENSIN) 40 MG tablet   Oral   Take 40 mg by mouth daily.         . ciprofloxacin (CIPRO) 500 MG tablet   Oral   Take 1 tablet (500 mg total) by mouth 2 (two) times daily. One po bid x 7 days   14 tablet   0   . feeding supplement (ENSURE IMMUNE HEALTH) LIQD   Oral   Take 237 mLs by mouth 2 (two) times daily. Vanilla flavor          . isosorbide mononitrate (IMDUR) 30 MG 24 hr tablet   Oral   Take 15 mg by mouth daily.         Marland Kitchen levothyroxine (SYNTHROID, LEVOTHROID) 75 MCG tablet   Oral   Take 75 mcg by mouth daily before breakfast.         . LORazepam (ATIVAN) 0.5 MG tablet   Oral   Take 1 tablet (0.5 mg  total) by mouth every 6 (six) hours as needed for anxiety.   30 tablet   0   . metoprolol (LOPRESSOR) 50 MG tablet   Oral   Take 50 mg by mouth 2 (two) times daily.         . traMADol (ULTRAM) 50 MG tablet   Oral   Take 1 tablet (50 mg total) by mouth every 8 (eight) hours as needed for pain.   30 tablet   0   . trimethoprim (TRIMPEX) 100 MG tablet   Oral   Take 100 mg by mouth daily.         . bisacodyl (DULCOLAX) 10 MG suppository   Rectal   Place 1 suppository (10 mg total) rectally as needed for constipation.   12 suppository   0   . ondansetron (ZOFRAN ODT) 4 MG disintegrating tablet   Oral   Take 1 tablet (4 mg total) by mouth every 8 (eight) hours as needed for nausea.   20 tablet   0    BP 168/79  Pulse 87  Temp(Src) 98.6 F (37 C) (Axillary)  Resp 21  SpO2 100% Vitals reviewed Physical Exam Physical Examination: General appearance - alert, well appearing, and in no distress Mental status - alert, oriented to person, place, and time Head- NCAT Eyes - pupils equal and reactive, extraocular eye movements intact Mouth - mucous membranes moist, pharynx normal without lesions Neck - supple, no  significant adenopathy Chest - clear to auscultation, no wheezes, rales or rhonchi, symmetric air entry Heart - normal rate, regular rhythm, normal S1, S2, no murmurs, rubs, clicks or gallops Abdomen - soft, nontender, nondistended, no masses or organomegaly Back exam - no midline tenderness to palpation, no CVA tenderness Neurological - alert, oriented to self only, answering some questions, moving all extremities Musculoskeletal - some ttp over lateral right hip, some pain with ROM, otherwise no joint tenderness, deformity or swelling Extremities - peripheral pulses normal, no pedal edema, no clubbing or cyanosis Skin - normal coloration and turgor, no rashes, no contusions/abrasion/bleeding  ED Course   Procedures (including critical care time)  Labs Reviewed - No data to display Dg Hip Complete Right  09/23/2012   *RADIOLOGY REPORT*  Clinical Data: Fall.  The patient is nonverbal.  RIGHT HIP - COMPLETE 2+ VIEW  Comparison: None.  Findings: No fracture.  The hip joints, SI joints and symphysis pubis are normally spaced and aligned.  The bones are diffusely demineralized.  The soft tissues are unremarkable.  IMPRESSION: No fracture or dislocation or acute finding.   Original Report Authenticated By: Amie Portland, M.D.   Ct Head Wo Contrast  09/23/2012   *RADIOLOGY REPORT*  Clinical Data:  Fall, no visible injury  CT HEAD WITHOUT CONTRAST CT CERVICAL SPINE WITHOUT CONTRAST  Technique:  Multidetector CT imaging of the head and cervical spine was performed following the standard protocol without intravenous contrast.  Multiplanar CT image reconstructions of the cervical spine were also generated.  Comparison:  09/15/2012  CT HEAD  Findings: No skull fracture.  No hemorrhage or extra-axial fluid. Diffuse atrophy with significant low attenuation in the deep white matter as seen on the prior study.  More confluent low attenuation in the posterior parietal lobe adjacent to the posterior lateral  ventricle, similar to prior study.  However, as compared to the prior study, at which time the overlying cortex appears normal, there is now mild low attenuation in the overlying cortex and subcortical white matter.  IMPRESSION: No  acute traumatic injury.  Appearance of confluent density right parietal lobe described on prior study performed 09/15/2012 shows some evolution suggesting that it represents a subacute infarction.  CT CERVICAL SPINE  Findings: No fracture or prevertebral soft tissue swelling. Diffuse osteopenia and significant diffuse degenerative disc disease and degenerative facet change.  Mild to moderate convex right scoliosis.  Grade 1 anterior listhesis of C7/T1 likely due to degenerative facet disease at this level with no fracture noted.  IMPRESSION: No acute traumatic injury.   Original Report Authenticated By: Esperanza Heir, M.D.   Ct Cervical Spine Wo Contrast  09/23/2012   *RADIOLOGY REPORT*  Clinical Data:  Fall, no visible injury  CT HEAD WITHOUT CONTRAST CT CERVICAL SPINE WITHOUT CONTRAST  Technique:  Multidetector CT imaging of the head and cervical spine was performed following the standard protocol without intravenous contrast.  Multiplanar CT image reconstructions of the cervical spine were also generated.  Comparison:  09/15/2012  CT HEAD  Findings: No skull fracture.  No hemorrhage or extra-axial fluid. Diffuse atrophy with significant low attenuation in the deep white matter as seen on the prior study.  More confluent low attenuation in the posterior parietal lobe adjacent to the posterior lateral ventricle, similar to prior study.  However, as compared to the prior study, at which time the overlying cortex appears normal, there is now mild low attenuation in the overlying cortex and subcortical white matter.  IMPRESSION: No acute traumatic injury.  Appearance of confluent density right parietal lobe described on prior study performed 09/15/2012 shows some evolution suggesting  that it represents a subacute infarction.  CT CERVICAL SPINE  Findings: No fracture or prevertebral soft tissue swelling. Diffuse osteopenia and significant diffuse degenerative disc disease and degenerative facet change.  Mild to moderate convex right scoliosis.  Grade 1 anterior listhesis of C7/T1 likely due to degenerative facet disease at this level with no fracture noted.  IMPRESSION: No acute traumatic injury.   Original Report Authenticated By: Esperanza Heir, M.D.    2:19 PM discussed CT findings with Dr. Leroy Kennedy, neurology- he has reviewed the CT scan as well.  Given no extremity weakness or other acute neurologic change, He agrees that further workup would not be likely to add much benefit to Ms. Yetta Barre' care.   1. Fall, initial encounter     MDM  Pt presenting for evaluation after fall.  She has dementia at baseline, but no obvious neuro deficits, no weakness of extremities, awake and alert, answering some questions.  See above note about head CT results.  Discharged with strict return precautions.  Ethelda Chick, MD 09/24/12 [phone]

## 2012-09-23 NOTE — ED Notes (Signed)
Patient transported to X-ray 

## 2012-09-23 NOTE — ED Notes (Signed)
Called daughters cell and left a VM that her mother is bring transferred back to her facility by [facility]

## 2012-09-23 NOTE — ED Notes (Addendum)
Pt arrived via [facility] from [place], unwitnessed fall in the doorway.  EMS reports no visibile signs of injury.  Pt hx of dementia and is nonverbal.  Pt arrived with a skin tear on her left elbow from a previous fall.  Pt in NAD.

## 2012-10-09 DEATH — deceased

## 2013-09-18 ENCOUNTER — Telehealth: Payer: Self-pay | Admitting: Cardiology

## 2013-09-18 NOTE — Telephone Encounter (Signed)
FMLA scanned In, pt Never came to pick up FMLA signed From 12.21.2012 this has been placed In shred   8.11.15/km

## 2014-05-24 IMAGING — US US RENAL
1 series · 14 of 25 positions shown · non-contrast
Comparison: None.

CLINICAL DATA: Recurrent urinary tract infections.

RENAL/URINARY TRACT ULTRASOUND COMPLETE

[Series 1: us renal · 0.22mm/px · 14 of 28 slices shown]
[im 1/28]
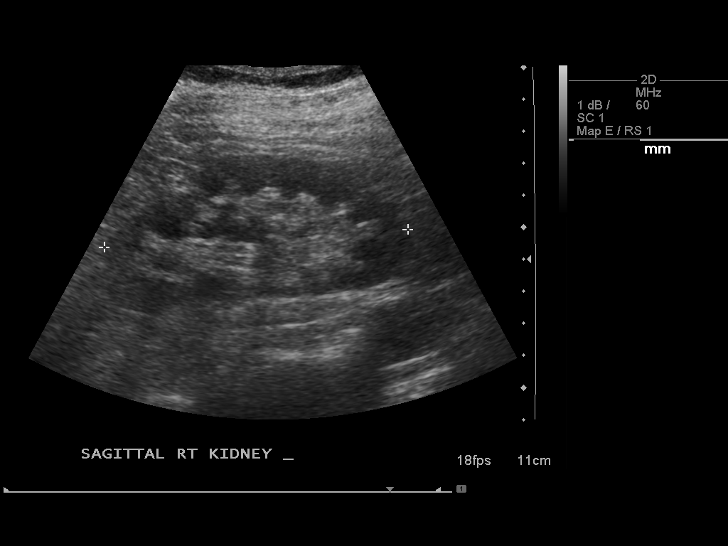
[im 3/28]
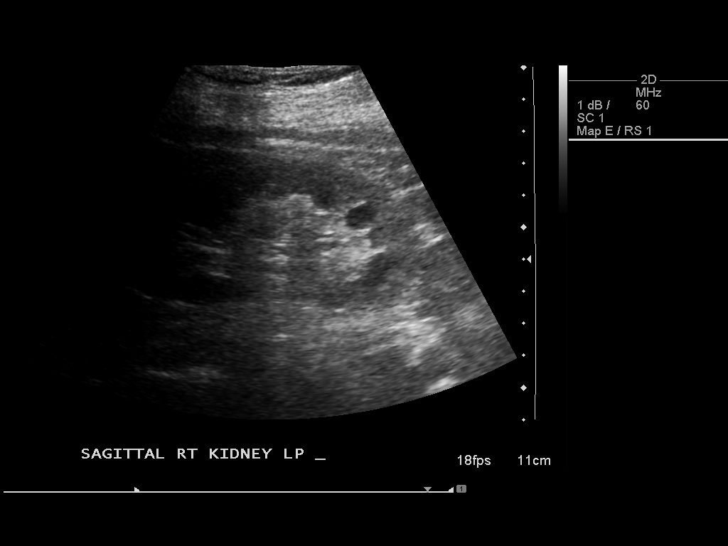
[im 5/28]
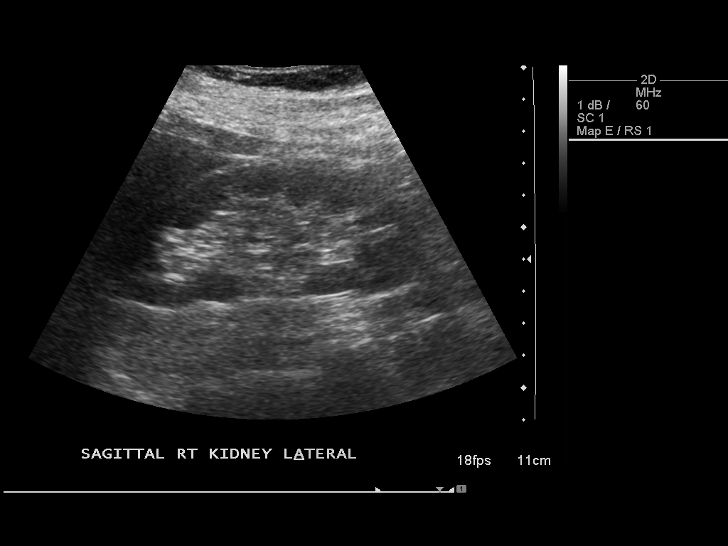
[im 7/28]
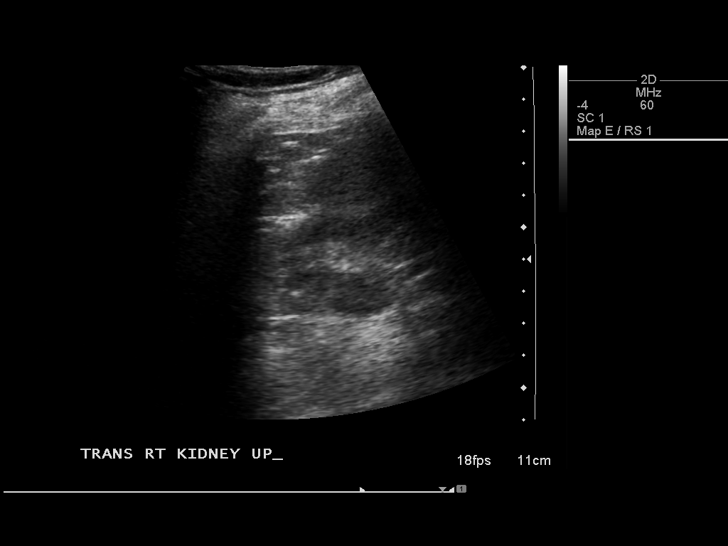
[im 10/28]
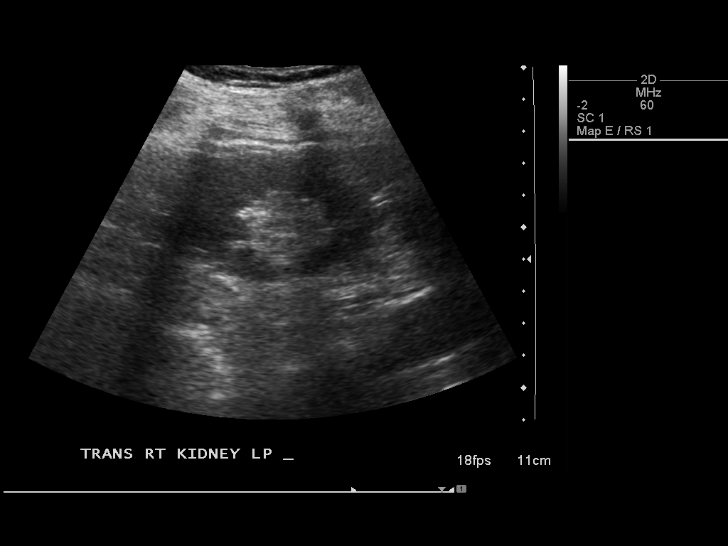
[im 11/28]
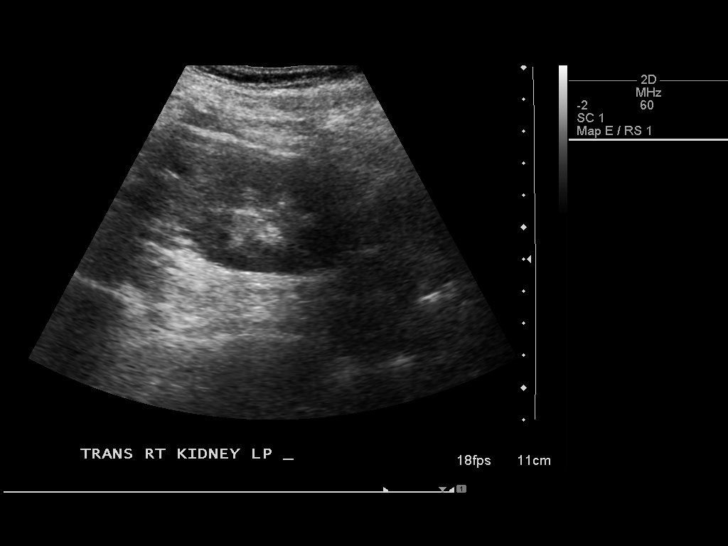
[im 13/28]
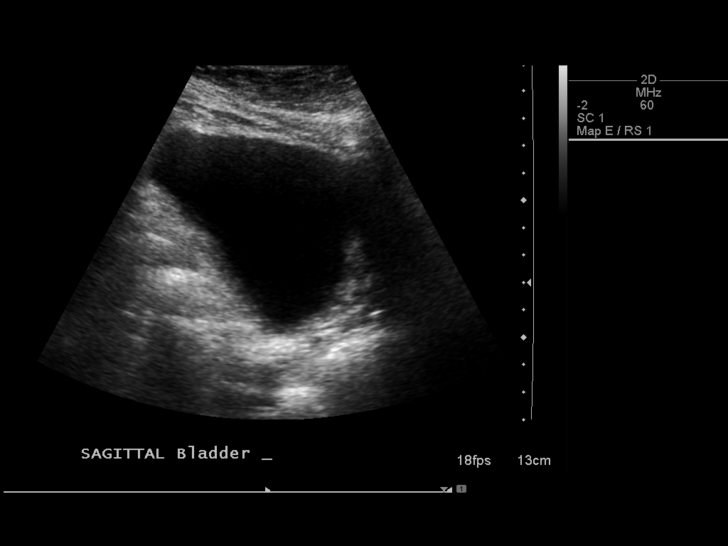
[im 15/28]
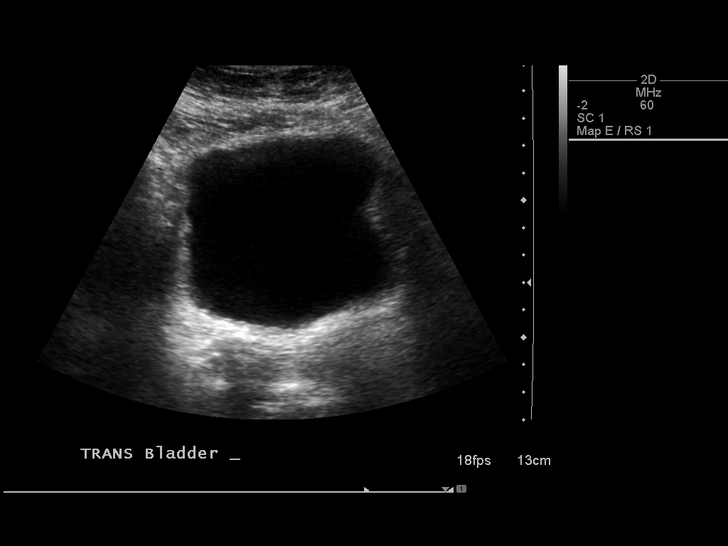
[im 17/28]
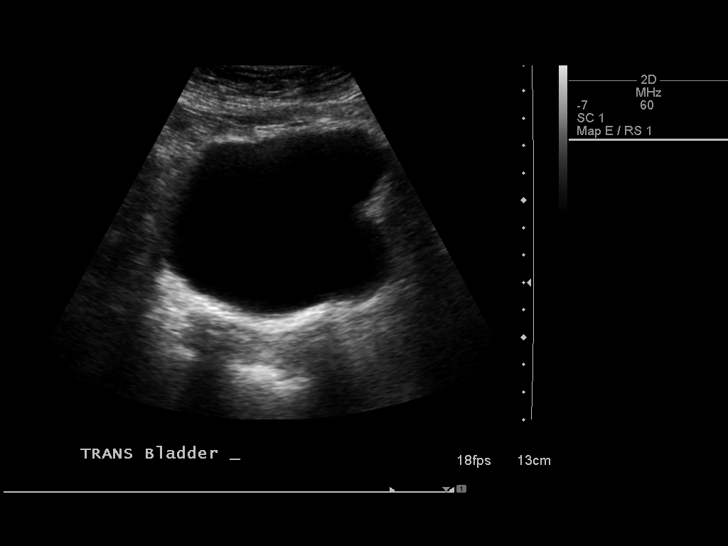
[im 19/28]
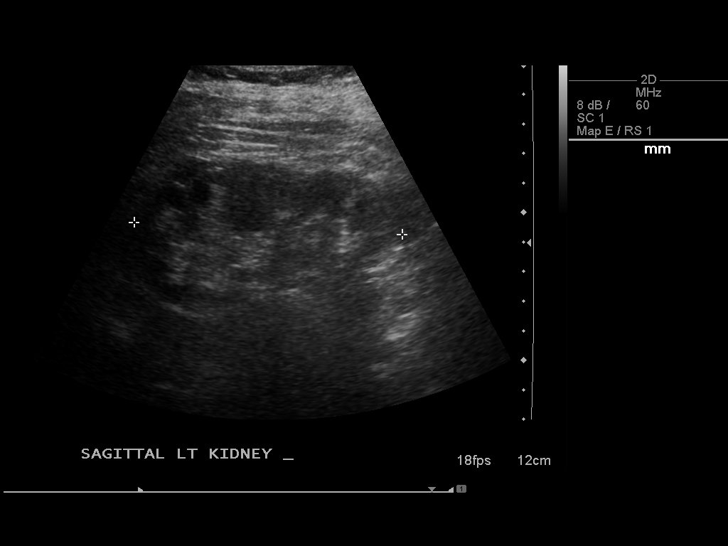
[im 21/28]
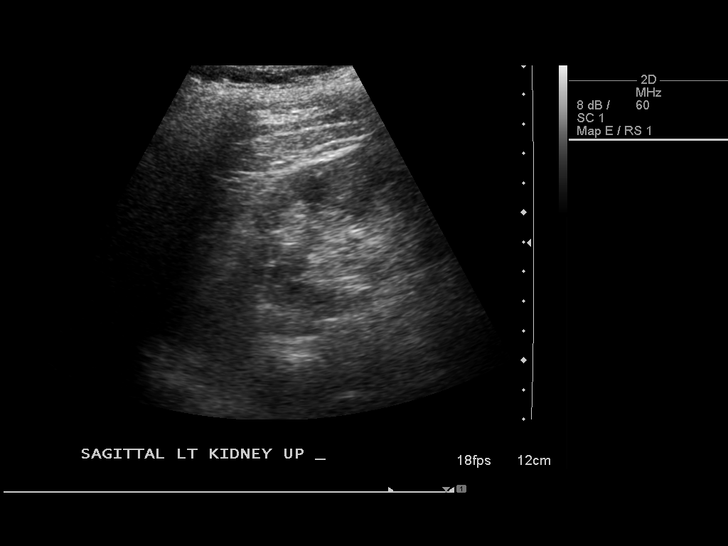
[im 23/28]
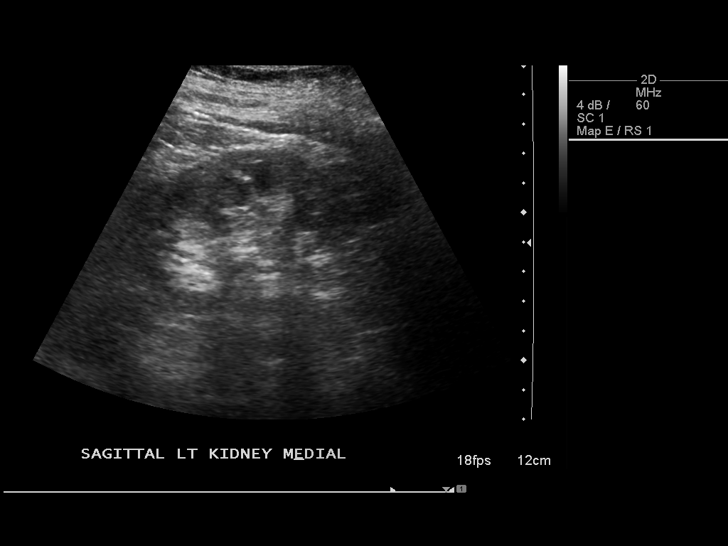
[im 25/28]
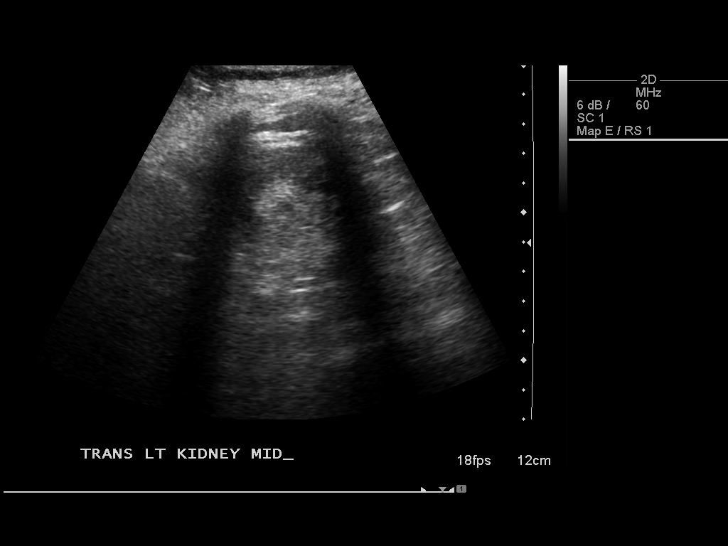
[im 28/28]
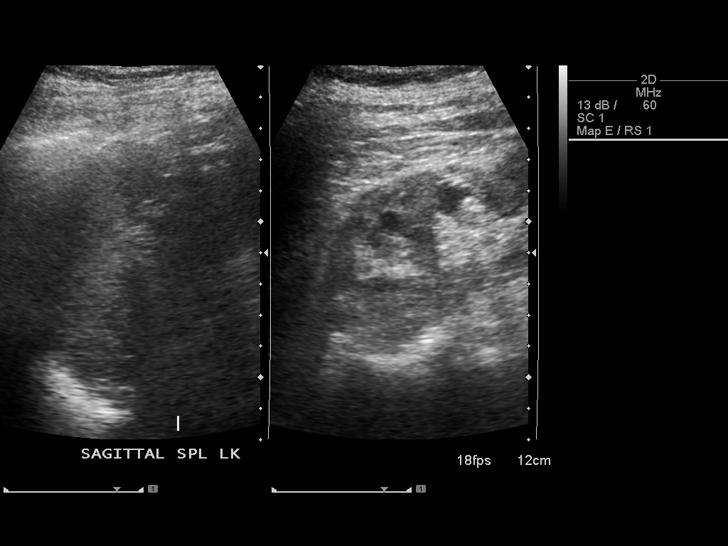

[14 of 25 positions shown; findings below may reference images not displayed]

FINDINGS: Right Kidney:  Normal, measuring 9.5 cm in length.  No
hydronephrosis.

Left Kidney:  Normal, measuring 9.9 cm in length.  No
hydronephrosis.

Bladder:  Normal.
IMPRESSION: Normal examination.

## 2014-05-24 IMAGING — CR DG CHEST 1V
1 series · 1 of 1 positions shown · non-contrast
Comparison: 08/07/2012

CLINICAL DATA: Diarrhea, agitation

CHEST - 1 VIEW

[x chest ap]
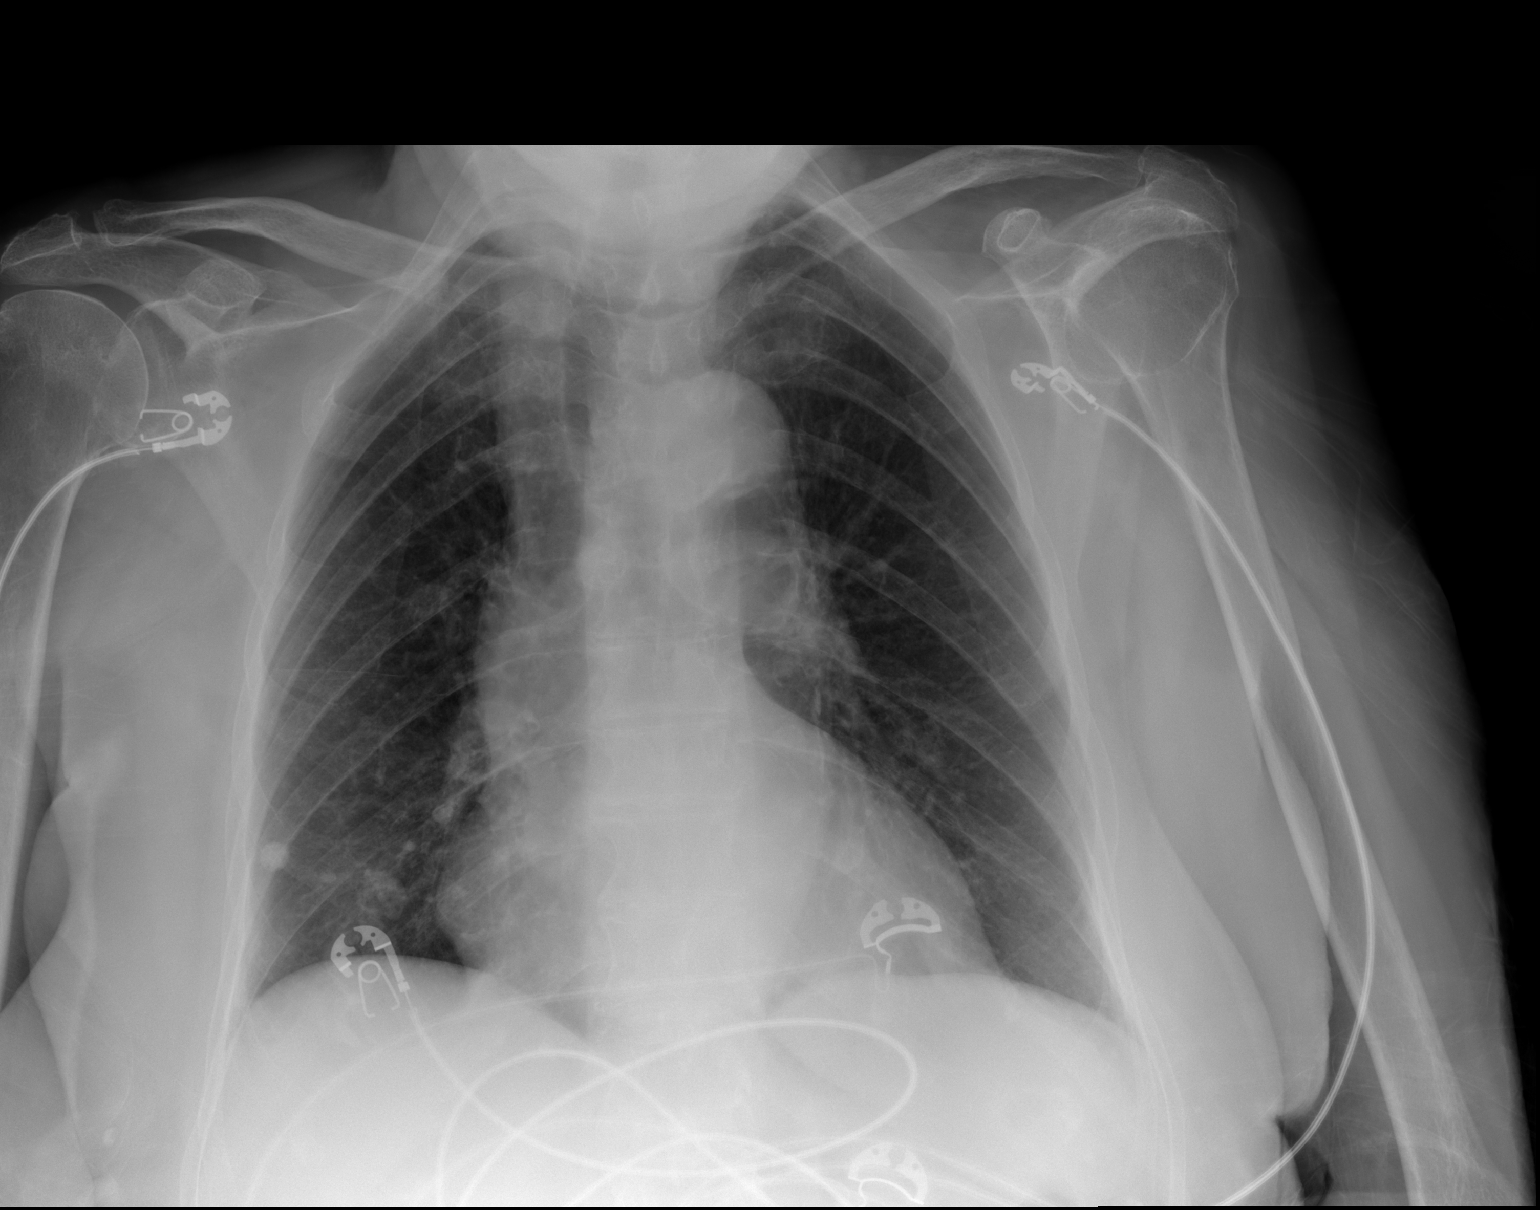

[1 of 1 positions shown; findings below may reference images not displayed]

FINDINGS: Cardiomediastinal silhouette is unremarkable.  No acute
infiltrate or pleural effusion.  No pulmonary edema.  Stable
calcified granuloma in the right lower lobe.
IMPRESSION: No active disease.

## 2014-06-29 IMAGING — CT CT HEAD W/O CM
3 series · 17 of 30 positions shown, 19 images · non-contrast
Comparison: 09/15/2012

CT HEAD

CLINICAL DATA: Fall, no visible injury

CT HEAD WITHOUT CONTRAST
CT CERVICAL SPINE WITHOUT CONTRAST
TECHNIQUE: Multidetector CT imaging of the head and cervical spine
was performed following the standard protocol without intravenous
contrast.  Multiplanar CT image reconstructions of the cervical
spine were also generated.

[Series 3: head w/o · axial · non-contrast · 0.43mm/px · z∈[-156,-56]mm · 5 of 31 slices shown, 7 images]
[im 6/31  brain]
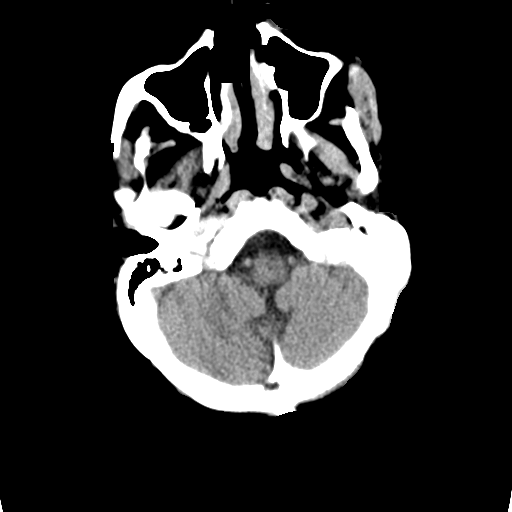
[im 6/31  bone]
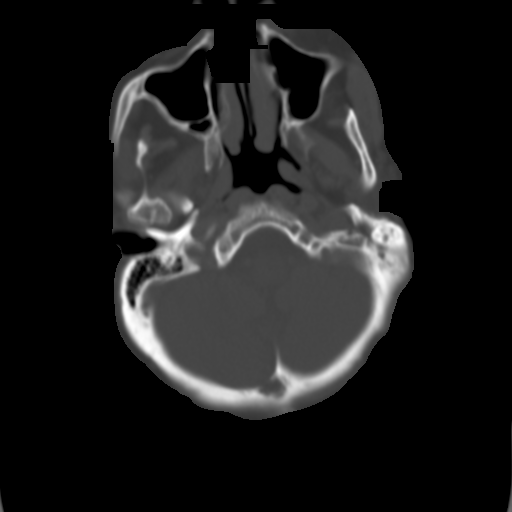
[im 11/31  brain]
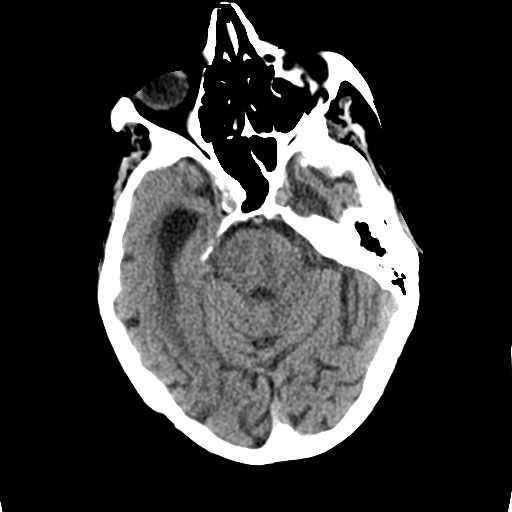
[im 16/31  brain]
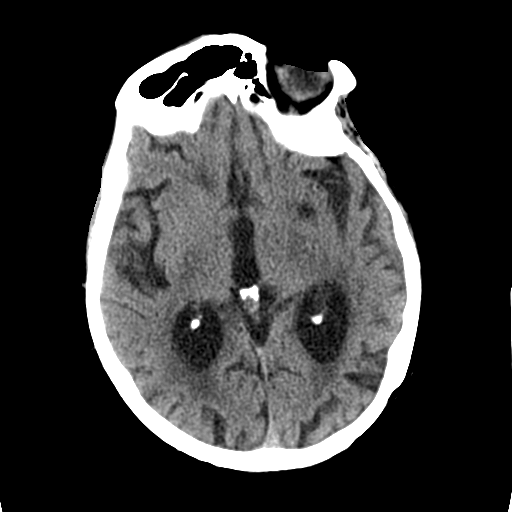
[im 21/31  brain]
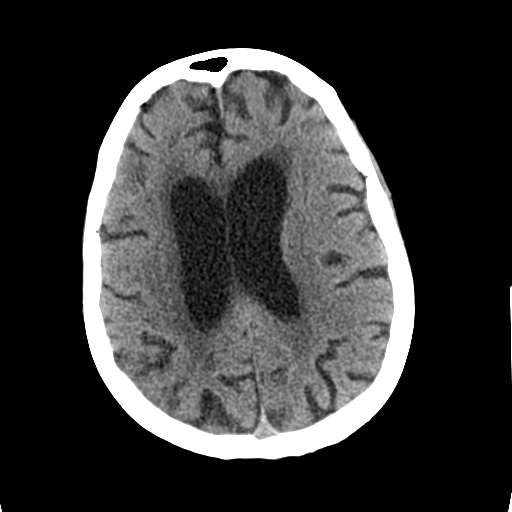
[im 26/31  brain]
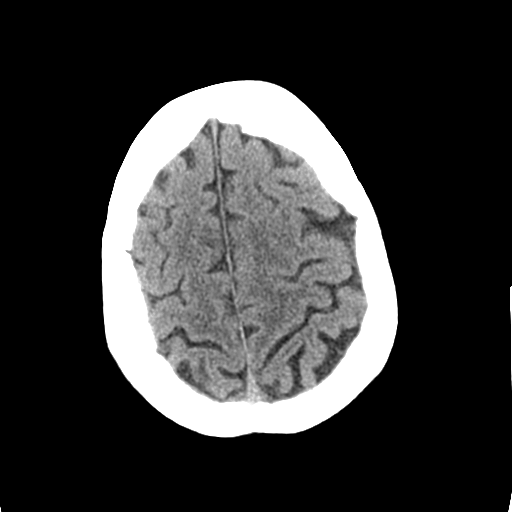
[im 26/31  bone]
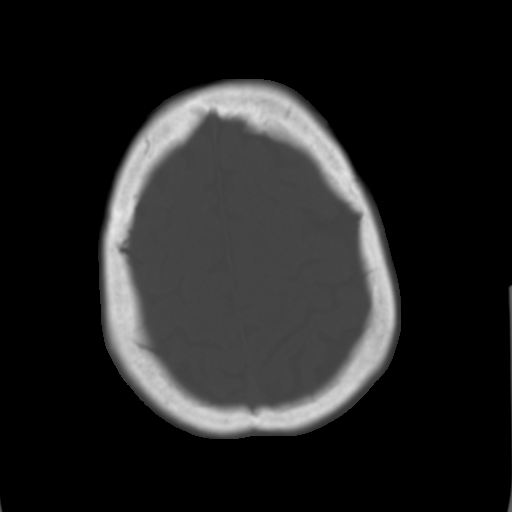

[Series 4: bone windows · axial · 0.43mm/px · z∈[-166,-46]mm · 8 of 52 slices shown]
[im 6/52  bone]
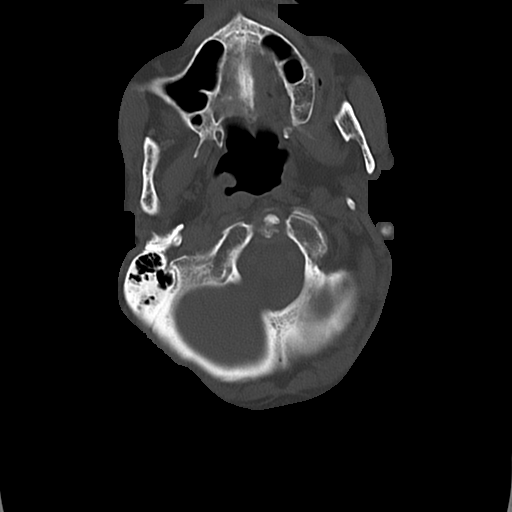
[im 12/52  bone]
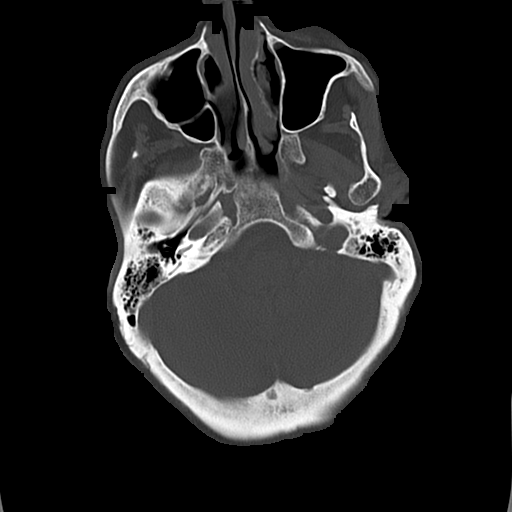
[im 18/52  bone]
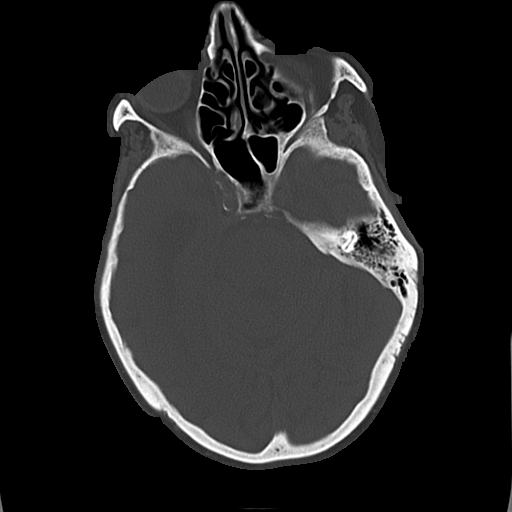
[im 23/52  bone]
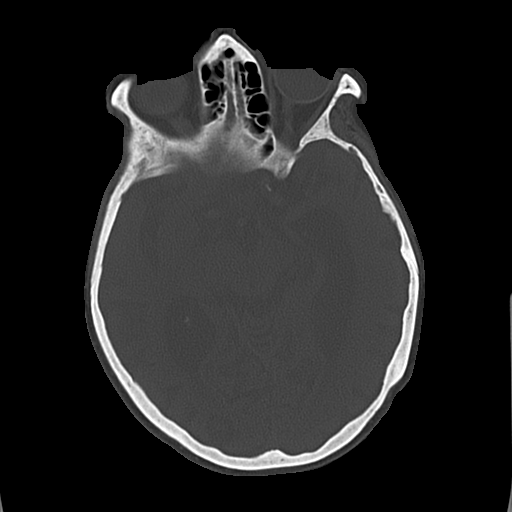
[im 29/52  bone]
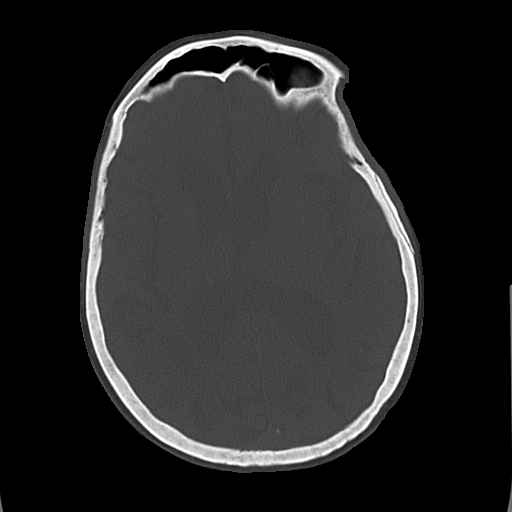
[im 35/52  bone]
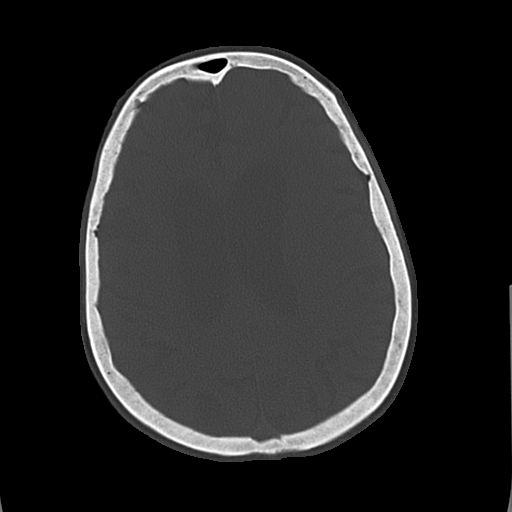
[im 40/52  bone]
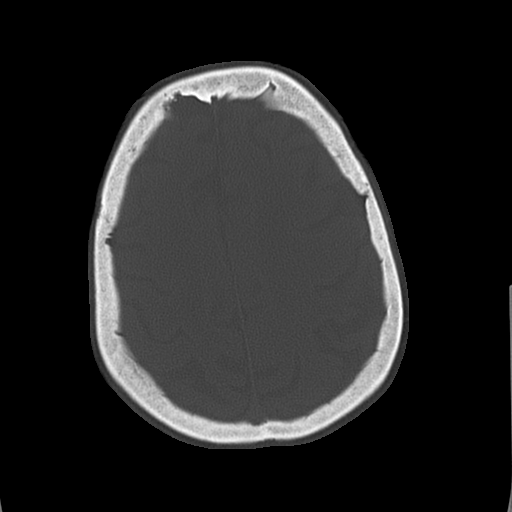
[im 46/52  bone]
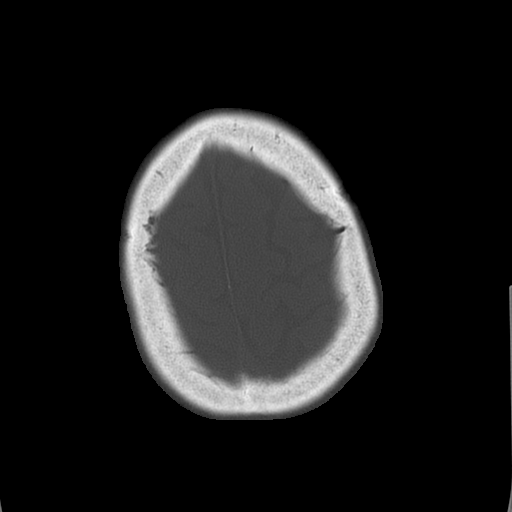

[Series 5: c-spine st · axial · 0.24mm/px · z∈[-276,-232]mm · 4 of 66 slices shown]
[im 6/66  brain]
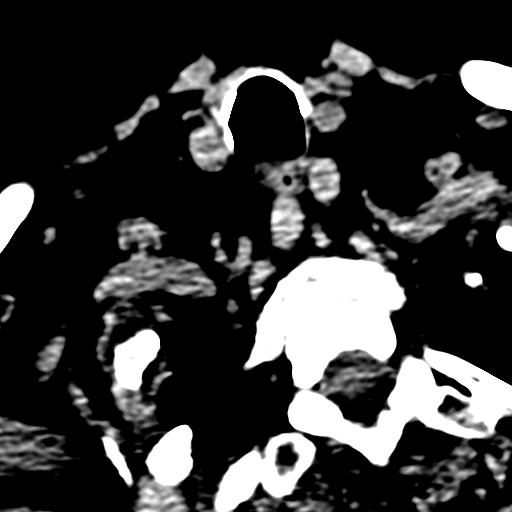
[im 17/66  brain]
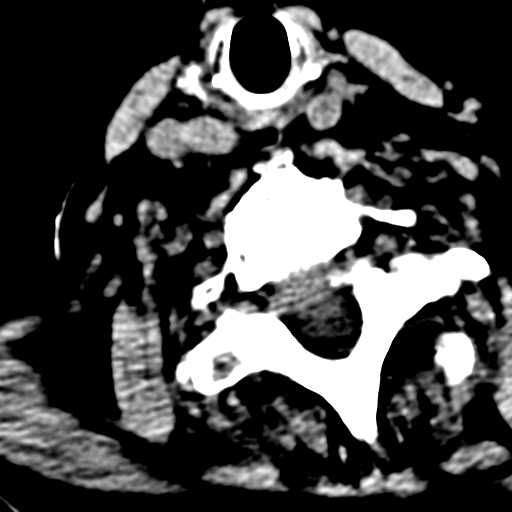
[im 22/66  brain]
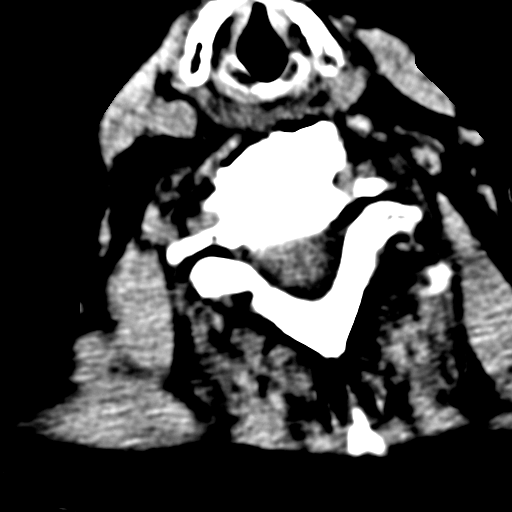
[im 28/66  brain]
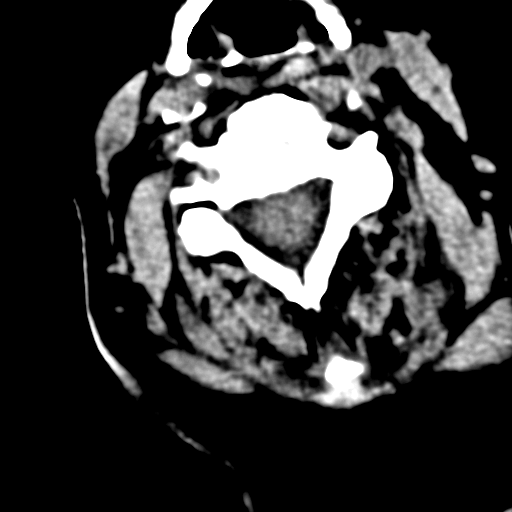

[17 of 30 positions shown; findings below may reference images not displayed]

FINDINGS: No skull fracture.  No hemorrhage or extra-axial fluid.
Diffuse atrophy with significant low attenuation in the deep white
matter as seen on the prior study.  More confluent low attenuation
in the posterior parietal lobe adjacent to the posterior lateral
ventricle, similar to prior study.  However, as compared to the
prior study, at which time the overlying cortex appears normal,
there is now mild low attenuation in the overlying cortex and
subcortical white matter.
IMPRESSION: No acute traumatic injury.  Appearance of confluent density right
parietal lobe described on prior study performed 09/15/2012 shows
some evolution suggesting that it represents a subacute infarction.

CT CERVICAL SPINE
FINDINGS: No fracture or prevertebral soft tissue swelling.
Diffuse osteopenia and significant diffuse degenerative disc
disease and degenerative facet change.  Mild to moderate convex
right scoliosis.  Grade 1 anterior listhesis of C7/T1 likely due to
degenerative facet disease at this level with no fracture noted.
IMPRESSION: No acute traumatic injury.
